# Patient Record
Sex: Male | Born: 1935 | Race: White | Hispanic: No | Marital: Married | Smoking: Former smoker
Health system: Southern US, Community
[De-identification: ages and names within clinical notes are randomized; demographics above are authoritative.]

## PROBLEM LIST (undated history)

## (undated) DIAGNOSIS — D689 Coagulation defect, unspecified: Secondary | ICD-10-CM

## (undated) DIAGNOSIS — J40 Bronchitis, not specified as acute or chronic: Secondary | ICD-10-CM

## (undated) DIAGNOSIS — N3020 Other chronic cystitis without hematuria: Secondary | ICD-10-CM

## (undated) DIAGNOSIS — C61 Malignant neoplasm of prostate: Secondary | ICD-10-CM

## (undated) DIAGNOSIS — C679 Malignant neoplasm of bladder, unspecified: Secondary | ICD-10-CM

## (undated) DIAGNOSIS — I2510 Atherosclerotic heart disease of native coronary artery without angina pectoris: Secondary | ICD-10-CM

## (undated) DIAGNOSIS — K604 Rectal fistula, unspecified: Secondary | ICD-10-CM

## (undated) DIAGNOSIS — N321 Vesicointestinal fistula: Secondary | ICD-10-CM

## (undated) DIAGNOSIS — R51 Headache: Secondary | ICD-10-CM

## (undated) DIAGNOSIS — M779 Enthesopathy, unspecified: Secondary | ICD-10-CM

## (undated) DIAGNOSIS — I10 Essential (primary) hypertension: Secondary | ICD-10-CM

## (undated) DIAGNOSIS — R069 Unspecified abnormalities of breathing: Secondary | ICD-10-CM

## (undated) DIAGNOSIS — I219 Acute myocardial infarction, unspecified: Secondary | ICD-10-CM

## (undated) DIAGNOSIS — I719 Aortic aneurysm of unspecified site, without rupture: Secondary | ICD-10-CM

## (undated) DIAGNOSIS — Z8679 Personal history of other diseases of the circulatory system: Secondary | ICD-10-CM

## (undated) DIAGNOSIS — I447 Left bundle-branch block, unspecified: Secondary | ICD-10-CM

## (undated) DIAGNOSIS — Z510 Encounter for antineoplastic radiation therapy: Secondary | ICD-10-CM

## (undated) DIAGNOSIS — N182 Chronic kidney disease, stage 2 (mild): Secondary | ICD-10-CM

## (undated) DIAGNOSIS — Z87891 Personal history of nicotine dependence: Secondary | ICD-10-CM

## (undated) DIAGNOSIS — R310 Gross hematuria: Secondary | ICD-10-CM

## (undated) DIAGNOSIS — K219 Gastro-esophageal reflux disease without esophagitis: Secondary | ICD-10-CM

## (undated) DIAGNOSIS — F329 Major depressive disorder, single episode, unspecified: Secondary | ICD-10-CM

## (undated) DIAGNOSIS — F3289 Other specified depressive episodes: Secondary | ICD-10-CM

## (undated) DIAGNOSIS — E7800 Pure hypercholesterolemia, unspecified: Secondary | ICD-10-CM

## (undated) HISTORY — DX: Personal history of other diseases of the circulatory system: Z86.79

## (undated) HISTORY — PX: CORONARY ARTERY BYPASS GRAFT: SHX141

## (undated) HISTORY — DX: Encounter for antineoplastic radiation therapy: Z51.0

## (undated) HISTORY — DX: Personal history of nicotine dependence: Z87.891

## (undated) HISTORY — DX: Aortic aneurysm of unspecified site, without rupture: I71.9

## (undated) HISTORY — DX: Essential (primary) hypertension: I10

## (undated) HISTORY — DX: Acute myocardial infarction, unspecified: I21.9

## (undated) HISTORY — DX: Vesicointestinal fistula: N32.1

## (undated) HISTORY — PX: CATARACT EXTRACTION: SUR2

## (undated) HISTORY — DX: Rectal fistula: K60.4

## (undated) HISTORY — DX: Other chronic cystitis without hematuria: N30.20

## (undated) HISTORY — DX: Major depressive disorder, single episode, unspecified: F32.9

## (undated) HISTORY — DX: Rectal fistula, unspecified: K60.40

## (undated) HISTORY — DX: Other specified depressive episodes: F32.89

## (undated) HISTORY — PX: CARDIAC CATHETERIZATION: SHX172

## (undated) HISTORY — DX: Gross hematuria: R31.0

## (undated) HISTORY — DX: Pure hypercholesterolemia, unspecified: E78.00

## (undated) HISTORY — DX: Coagulation defect, unspecified: D68.9

## (undated) HISTORY — DX: Malignant neoplasm of bladder, unspecified: C67.9

## (undated) HISTORY — DX: Malignant neoplasm of prostate: C61

---

## 1964-07-02 HISTORY — PX: COLON SURGERY: SHX602

## 1966-07-02 HISTORY — PX: COLON SURGERY: SHX602

## 1990-07-02 HISTORY — PX: BALLOON DILATION: SHX5330

## 1991-07-03 HISTORY — PX: ANGIOPLASTY: SHX39

## 1996-07-02 HISTORY — PX: BYPASS GRAFT: SHX909

## 1996-07-02 HISTORY — PX: PROSTATE SURGERY: SHX751

## 1997-04-01 HISTORY — PX: OTHER SURGICAL HISTORY: SHX169

## 1997-05-02 HISTORY — PX: OTHER SURGICAL HISTORY: SHX169

## 1997-07-02 HISTORY — PX: OTHER SURGICAL HISTORY: SHX169

## 1998-05-02 HISTORY — PX: OTHER SURGICAL HISTORY: SHX169

## 1998-05-05 ENCOUNTER — Ambulatory Visit (HOSPITAL_COMMUNITY): Admission: RE | Admit: 1998-05-05 | Discharge: 1998-05-05 | Payer: Self-pay | Admitting: Gastroenterology

## 2002-06-12 ENCOUNTER — Emergency Department (HOSPITAL_COMMUNITY): Admission: EM | Admit: 2002-06-12 | Discharge: 2002-06-12 | Payer: Self-pay | Admitting: Emergency Medicine

## 2002-06-12 ENCOUNTER — Encounter: Payer: Self-pay | Admitting: Emergency Medicine

## 2002-06-12 ENCOUNTER — Encounter: Payer: Self-pay | Admitting: Orthopedic Surgery

## 2006-07-02 DIAGNOSIS — I719 Aortic aneurysm of unspecified site, without rupture: Secondary | ICD-10-CM

## 2006-07-02 HISTORY — DX: Aortic aneurysm of unspecified site, without rupture: I71.9

## 2006-08-09 ENCOUNTER — Encounter: Admission: RE | Admit: 2006-08-09 | Discharge: 2006-08-09 | Payer: Self-pay | Admitting: Internal Medicine

## 2006-08-31 HISTORY — PX: OTHER SURGICAL HISTORY: SHX169

## 2006-09-04 ENCOUNTER — Ambulatory Visit (HOSPITAL_COMMUNITY): Admission: RE | Admit: 2006-09-04 | Discharge: 2006-09-05 | Payer: Self-pay | Admitting: Urology

## 2006-09-04 ENCOUNTER — Encounter (INDEPENDENT_AMBULATORY_CARE_PROVIDER_SITE_OTHER): Payer: Self-pay | Admitting: Specialist

## 2006-09-18 ENCOUNTER — Ambulatory Visit: Payer: Self-pay | Admitting: Vascular Surgery

## 2006-10-21 ENCOUNTER — Encounter (INDEPENDENT_AMBULATORY_CARE_PROVIDER_SITE_OTHER): Payer: Self-pay | Admitting: Specialist

## 2006-10-21 ENCOUNTER — Ambulatory Visit (HOSPITAL_BASED_OUTPATIENT_CLINIC_OR_DEPARTMENT_OTHER): Admission: RE | Admit: 2006-10-21 | Discharge: 2006-10-21 | Payer: Self-pay | Admitting: Urology

## 2007-03-10 ENCOUNTER — Ambulatory Visit (HOSPITAL_BASED_OUTPATIENT_CLINIC_OR_DEPARTMENT_OTHER): Admission: RE | Admit: 2007-03-10 | Discharge: 2007-03-10 | Payer: Self-pay | Admitting: Urology

## 2007-03-10 ENCOUNTER — Encounter (INDEPENDENT_AMBULATORY_CARE_PROVIDER_SITE_OTHER): Payer: Self-pay | Admitting: Urology

## 2007-03-18 ENCOUNTER — Encounter: Admission: RE | Admit: 2007-03-18 | Discharge: 2007-03-18 | Payer: Self-pay | Admitting: Vascular Surgery

## 2007-03-18 ENCOUNTER — Ambulatory Visit: Payer: Self-pay | Admitting: Vascular Surgery

## 2007-09-16 ENCOUNTER — Ambulatory Visit: Payer: Self-pay | Admitting: Vascular Surgery

## 2007-09-16 ENCOUNTER — Encounter: Admission: RE | Admit: 2007-09-16 | Discharge: 2007-09-16 | Payer: Self-pay | Admitting: Vascular Surgery

## 2008-03-16 ENCOUNTER — Ambulatory Visit: Payer: Self-pay | Admitting: Vascular Surgery

## 2008-03-22 ENCOUNTER — Ambulatory Visit (HOSPITAL_COMMUNITY): Admission: RE | Admit: 2008-03-22 | Discharge: 2008-03-22 | Payer: Self-pay | Admitting: Urology

## 2008-09-07 ENCOUNTER — Ambulatory Visit: Payer: Self-pay | Admitting: Vascular Surgery

## 2008-09-12 ENCOUNTER — Ambulatory Visit: Payer: Self-pay | Admitting: *Deleted

## 2008-09-13 ENCOUNTER — Observation Stay (HOSPITAL_COMMUNITY): Admission: EM | Admit: 2008-09-13 | Discharge: 2008-09-14 | Payer: Self-pay | Admitting: Emergency Medicine

## 2009-07-02 HISTORY — PX: OTHER SURGICAL HISTORY: SHX169

## 2009-08-17 ENCOUNTER — Ambulatory Visit (HOSPITAL_COMMUNITY): Admission: RE | Admit: 2009-08-17 | Discharge: 2009-08-17 | Payer: Self-pay | Admitting: Urology

## 2010-02-22 ENCOUNTER — Ambulatory Visit (HOSPITAL_COMMUNITY): Admission: RE | Admit: 2010-02-22 | Discharge: 2010-02-22 | Payer: Self-pay | Admitting: Urology

## 2010-04-21 ENCOUNTER — Ambulatory Visit (HOSPITAL_COMMUNITY)
Admission: RE | Admit: 2010-04-21 | Discharge: 2010-04-21 | Payer: Self-pay | Source: Home / Self Care | Admitting: Urology

## 2010-06-19 ENCOUNTER — Encounter (HOSPITAL_BASED_OUTPATIENT_CLINIC_OR_DEPARTMENT_OTHER)
Admission: RE | Admit: 2010-06-19 | Discharge: 2010-08-01 | Payer: Self-pay | Source: Home / Self Care | Attending: Internal Medicine | Admitting: Internal Medicine

## 2010-06-29 ENCOUNTER — Ambulatory Visit (HOSPITAL_COMMUNITY): Admission: RE | Admit: 2010-06-29 | Payer: Self-pay | Source: Home / Self Care | Admitting: Internal Medicine

## 2010-07-11 ENCOUNTER — Encounter (HOSPITAL_BASED_OUTPATIENT_CLINIC_OR_DEPARTMENT_OTHER): Payer: Self-pay | Admitting: Internal Medicine

## 2010-07-11 ENCOUNTER — Ambulatory Visit (HOSPITAL_COMMUNITY)
Admission: RE | Admit: 2010-07-11 | Discharge: 2010-07-11 | Payer: Self-pay | Source: Home / Self Care | Attending: Internal Medicine | Admitting: Internal Medicine

## 2010-07-17 LAB — DIFFERENTIAL

## 2010-07-17 LAB — COMPREHENSIVE METABOLIC PANEL
ALT: 10 U/L (ref 0–53)
AST: 20 U/L (ref 0–37)
Albumin: 3.2 g/dL — ABNORMAL LOW (ref 3.5–5.2)
Alkaline Phosphatase: 56 U/L (ref 39–117)
BUN: 9 mg/dL (ref 6–23)
CO2: 30 mEq/L (ref 19–32)
Calcium: 9.5 mg/dL (ref 8.4–10.5)
Chloride: 103 mEq/L (ref 96–112)
Creatinine, Ser: 0.92 mg/dL (ref 0.4–1.5)
GFR calc Af Amer: >60 mL/min (ref >60)
GFR calc non Af Amer: >60 mL/min (ref >60)
Glucose, Bld: 114 mg/dL — ABNORMAL HIGH (ref 70–99)
Potassium: 4.3 mEq/L (ref 3.5–5.1)
Sodium: 140 mEq/L (ref 135–145)
Total Bilirubin: 0.5 mg/dL (ref 0.3–1.2)
Total Protein: 7.6 g/dL (ref 6.0–8.3)

## 2010-07-17 LAB — CBC
HCT: 42 % (ref 39.0–52.0)
Hemoglobin: 13.9 g/dL (ref 13.0–17.0)
MCH: 30.6 pg (ref 26.0–34.0)
MCHC: 33.1 g/dL (ref 30.0–36.0)
MCV: 92.5 fL (ref 78.0–100.0)
Platelets: 203 10*3/uL (ref 150–400)
RBC: 4.54 MIL/uL (ref 4.22–5.81)
RDW: 15.1 % (ref 11.5–15.5)
WBC: 9.4 10*3/uL (ref 4.0–10.5)

## 2010-07-17 LAB — SEDIMENTATION RATE: Sed Rate: 37 mm/hr — ABNORMAL HIGH (ref 0–16)

## 2010-07-17 LAB — PREALBUMIN: Prealbumin: 16.3 mg/dL — ABNORMAL LOW (ref 17.0–34.0)

## 2010-07-23 ENCOUNTER — Encounter: Payer: Self-pay | Admitting: Urology

## 2010-07-23 ENCOUNTER — Encounter: Payer: Self-pay | Admitting: Vascular Surgery

## 2010-07-24 ENCOUNTER — Encounter: Payer: Self-pay | Admitting: Vascular Surgery

## 2010-08-02 ENCOUNTER — Encounter (HOSPITAL_BASED_OUTPATIENT_CLINIC_OR_DEPARTMENT_OTHER): Payer: Medicare Other | Attending: Internal Medicine

## 2010-08-02 DIAGNOSIS — M8708 Idiopathic aseptic necrosis of bone, other site: Secondary | ICD-10-CM | POA: Insufficient documentation

## 2010-08-02 DIAGNOSIS — I10 Essential (primary) hypertension: Secondary | ICD-10-CM | POA: Insufficient documentation

## 2010-08-02 DIAGNOSIS — Z923 Personal history of irradiation: Secondary | ICD-10-CM | POA: Insufficient documentation

## 2010-08-02 DIAGNOSIS — I2510 Atherosclerotic heart disease of native coronary artery without angina pectoris: Secondary | ICD-10-CM | POA: Insufficient documentation

## 2010-08-02 DIAGNOSIS — C61 Malignant neoplasm of prostate: Secondary | ICD-10-CM | POA: Insufficient documentation

## 2010-08-02 DIAGNOSIS — E785 Hyperlipidemia, unspecified: Secondary | ICD-10-CM | POA: Insufficient documentation

## 2010-08-31 ENCOUNTER — Encounter (HOSPITAL_BASED_OUTPATIENT_CLINIC_OR_DEPARTMENT_OTHER): Payer: Medicare Other | Attending: Internal Medicine

## 2010-08-31 DIAGNOSIS — I2510 Atherosclerotic heart disease of native coronary artery without angina pectoris: Secondary | ICD-10-CM | POA: Insufficient documentation

## 2010-08-31 DIAGNOSIS — C61 Malignant neoplasm of prostate: Secondary | ICD-10-CM | POA: Insufficient documentation

## 2010-08-31 DIAGNOSIS — Z923 Personal history of irradiation: Secondary | ICD-10-CM | POA: Insufficient documentation

## 2010-08-31 DIAGNOSIS — M8708 Idiopathic aseptic necrosis of bone, other site: Secondary | ICD-10-CM | POA: Insufficient documentation

## 2010-08-31 DIAGNOSIS — E785 Hyperlipidemia, unspecified: Secondary | ICD-10-CM | POA: Insufficient documentation

## 2010-08-31 DIAGNOSIS — I10 Essential (primary) hypertension: Secondary | ICD-10-CM | POA: Insufficient documentation

## 2010-09-15 LAB — BASIC METABOLIC PANEL
BUN: 9 mg/dL (ref 6–23)
CO2: 29 mEq/L (ref 19–32)
Calcium: 9.1 mg/dL (ref 8.4–10.5)
Chloride: 102 mEq/L (ref 96–112)
Creatinine, Ser: 0.86 mg/dL (ref 0.4–1.5)
GFR calc Af Amer: >60 mL/min (ref >60)
GFR calc non Af Amer: >60 mL/min (ref >60)
Glucose, Bld: 123 mg/dL — ABNORMAL HIGH (ref 70–99)
Potassium: 4.2 mEq/L (ref 3.5–5.1)
Sodium: 138 mEq/L (ref 135–145)

## 2010-09-15 LAB — SURGICAL PCR SCREEN
MRSA, PCR: NEGATIVE
Staphylococcus aureus: POSITIVE — AB

## 2010-09-15 LAB — CBC
HCT: 37.1 % — ABNORMAL LOW (ref 39.0–52.0)
Hemoglobin: 12.2 g/dL — ABNORMAL LOW (ref 13.0–17.0)
MCH: 29.6 pg (ref 26.0–34.0)
MCHC: 33 g/dL (ref 30.0–36.0)
MCV: 89.6 fL (ref 78.0–100.0)
Platelets: 287 10*3/uL (ref 150–400)
RBC: 4.14 MIL/uL — ABNORMAL LOW (ref 4.22–5.81)
RDW: 16.1 % — ABNORMAL HIGH (ref 11.5–15.5)
WBC: 9.2 10*3/uL (ref 4.0–10.5)

## 2010-09-20 LAB — BASIC METABOLIC PANEL
BUN: 13 mg/dL (ref 6–23)
CO2: 26 mEq/L (ref 19–32)
Calcium: 9.4 mg/dL (ref 8.4–10.5)
Chloride: 103 mEq/L (ref 96–112)
Creatinine, Ser: 1.04 mg/dL (ref 0.4–1.5)
GFR calc Af Amer: >60 mL/min (ref >60)
GFR calc non Af Amer: >60 mL/min (ref >60)
Glucose, Bld: 133 mg/dL — ABNORMAL HIGH (ref 70–99)
Potassium: 4.5 mEq/L (ref 3.5–5.1)
Sodium: 138 mEq/L (ref 135–145)

## 2010-09-20 LAB — CBC
HCT: 42.1 % (ref 39.0–52.0)
MCHC: 33.9 g/dL (ref 30.0–36.0)
MCV: 92.4 fL (ref 78.0–100.0)
RBC: 4.56 MIL/uL (ref 4.22–5.81)
WBC: 11 10*3/uL — ABNORMAL HIGH (ref 4.0–10.5)

## 2010-10-02 ENCOUNTER — Encounter (HOSPITAL_BASED_OUTPATIENT_CLINIC_OR_DEPARTMENT_OTHER): Payer: Medicare Other | Attending: Internal Medicine

## 2010-10-02 DIAGNOSIS — M8708 Idiopathic aseptic necrosis of bone, other site: Secondary | ICD-10-CM | POA: Insufficient documentation

## 2010-10-02 DIAGNOSIS — I10 Essential (primary) hypertension: Secondary | ICD-10-CM | POA: Insufficient documentation

## 2010-10-02 DIAGNOSIS — I2510 Atherosclerotic heart disease of native coronary artery without angina pectoris: Secondary | ICD-10-CM | POA: Insufficient documentation

## 2010-10-02 DIAGNOSIS — E785 Hyperlipidemia, unspecified: Secondary | ICD-10-CM | POA: Insufficient documentation

## 2010-10-02 DIAGNOSIS — Z923 Personal history of irradiation: Secondary | ICD-10-CM | POA: Insufficient documentation

## 2010-10-02 DIAGNOSIS — C61 Malignant neoplasm of prostate: Secondary | ICD-10-CM | POA: Insufficient documentation

## 2010-10-12 LAB — CBC
HCT: 37.6 % — ABNORMAL LOW (ref 39.0–52.0)
HCT: 40.8 % (ref 39.0–52.0)
HCT: 41.7 % (ref 39.0–52.0)
Hemoglobin: 13.4 g/dL (ref 13.0–17.0)
Hemoglobin: 14.2 g/dL (ref 13.0–17.0)
Hemoglobin: 14.4 g/dL (ref 13.0–17.0)
MCHC: 35.5 g/dL (ref 30.0–36.0)
MCHC: 34.5 g/dL (ref 30.0–36.0)
MCHC: 34.8 g/dL (ref 30.0–36.0)
MCV: 95.5 fL (ref 78.0–100.0)
MCV: 97 fL (ref 78.0–100.0)
MCV: 97.1 fL (ref 78.0–100.0)
Platelets: 141 10*3/uL — ABNORMAL LOW (ref 150–400)
Platelets: 153 10*3/uL (ref 150–400)
Platelets: 175 10*3/uL (ref 150–400)
RBC: 3.94 MIL/uL — ABNORMAL LOW (ref 4.22–5.81)
RBC: 4.2 MIL/uL — ABNORMAL LOW (ref 4.22–5.81)
RBC: 4.3 MIL/uL (ref 4.22–5.81)
RDW: 13.7 % (ref 11.5–15.5)
RDW: 13.5 % (ref 11.5–15.5)
RDW: 13.5 % (ref 11.5–15.5)
WBC: 7.8 10*3/uL (ref 4.0–10.5)
WBC: 8.3 10*3/uL (ref 4.0–10.5)
WBC: 8.4 10*3/uL (ref 4.0–10.5)

## 2010-10-12 LAB — POCT I-STAT, CHEM 8
BUN: 12 mg/dL (ref 6–23)
Calcium, Ion: 1.16 mmol/L (ref 1.12–1.32)
Chloride: 105 meq/L (ref 96–112)
Creatinine, Ser: 1 mg/dL (ref 0.4–1.5)
Glucose, Bld: 88 mg/dL (ref 70–99)
HCT: 43 % (ref 39.0–52.0)
Hemoglobin: 14.6 g/dL (ref 13.0–17.0)
Potassium: 4.2 meq/L (ref 3.5–5.1)
Sodium: 142 meq/L (ref 135–145)
TCO2: 28 mmol/L (ref 0–100)

## 2010-10-12 LAB — POCT CARDIAC MARKERS
Myoglobin, poc: 96.7 ng/mL (ref 12–200)
Troponin i, poc: <0.05 ng/mL (ref 0.00–0.09)

## 2010-10-12 LAB — CARDIAC PANEL(CRET KIN+CKTOT+MB+TROPI)
CK, MB: 1.5 ng/mL (ref 0.3–4.0)
CK, MB: 1.8 ng/mL (ref 0.3–4.0)
Relative Index: INVALID (ref 0.0–2.5)
Relative Index: INVALID (ref 0.0–2.5)
Total CK: 62 U/L (ref 7–232)
Total CK: 75 U/L (ref 7–232)

## 2010-10-12 LAB — DIFFERENTIAL
Basophils Absolute: 0 10*3/uL (ref 0.0–0.1)
Basophils Relative: 1 % (ref 0–1)
Lymphocytes Relative: 18 % (ref 12–46)
Monocytes Absolute: 0.6 10*3/uL (ref 0.1–1.0)
Neutro Abs: 6 10*3/uL (ref 1.7–7.7)
Neutrophils Relative %: 71 % (ref 43–77)

## 2010-10-12 LAB — PROTIME-INR
INR: 1 (ref 0.00–1.49)
Prothrombin Time: 13.8 s (ref 11.6–15.2)

## 2010-10-12 LAB — LIPID PANEL
HDL: 35 mg/dL — ABNORMAL LOW
Total CHOL/HDL Ratio: 3.9 ratio
Triglycerides: 70 mg/dL
VLDL: 14 mg/dL (ref 0–40)

## 2010-10-12 LAB — BASIC METABOLIC PANEL WITH GFR
BUN: 12 mg/dL (ref 6–23)
CO2: 25 meq/L (ref 19–32)
Calcium: 9 mg/dL (ref 8.4–10.5)
Chloride: 104 meq/L (ref 96–112)
Creatinine, Ser: 1.05 mg/dL (ref 0.4–1.5)
GFR calc non Af Amer: >60 mL/min
Glucose, Bld: 115 mg/dL — ABNORMAL HIGH (ref 70–99)
Potassium: 4.2 meq/L (ref 3.5–5.1)
Sodium: 139 meq/L (ref 135–145)

## 2010-10-12 LAB — HEPARIN LEVEL (UNFRACTIONATED): Heparin Unfractionated: <0.1 IU/mL — ABNORMAL LOW (ref 0.30–0.70)

## 2010-10-31 ENCOUNTER — Encounter (HOSPITAL_BASED_OUTPATIENT_CLINIC_OR_DEPARTMENT_OTHER): Payer: Medicare Other | Attending: Internal Medicine

## 2010-10-31 DIAGNOSIS — M8708 Idiopathic aseptic necrosis of bone, other site: Secondary | ICD-10-CM | POA: Insufficient documentation

## 2010-10-31 DIAGNOSIS — Z8546 Personal history of malignant neoplasm of prostate: Secondary | ICD-10-CM | POA: Insufficient documentation

## 2010-10-31 DIAGNOSIS — C679 Malignant neoplasm of bladder, unspecified: Secondary | ICD-10-CM | POA: Insufficient documentation

## 2010-10-31 DIAGNOSIS — Y842 Radiological procedure and radiotherapy as the cause of abnormal reaction of the patient, or of later complication, without mention of misadventure at the time of the procedure: Secondary | ICD-10-CM | POA: Insufficient documentation

## 2010-10-31 DIAGNOSIS — C61 Malignant neoplasm of prostate: Secondary | ICD-10-CM | POA: Insufficient documentation

## 2010-11-14 NOTE — Assessment & Plan Note (Signed)
OFFICE VISIT   Zachary Haas, Zachary Haas  DOB:  [birth year]                                       09/16/2007  ZOXWR#:[id number]   HISTORY:  I saw the patient in the office today for continued followup  of his abdominal aortic aneurysm.  This is a pleasant 77 year old  gentleman who I originally had seen in consultation with an abdominal  aortic aneurysm in March of 2008 at which time it measured 4.6 cm in  maximum diameter.  On his most recent followup visit it was measuring at  5 cm only the aorta was slightly tortuous and this may have been an  overestimate.  He comes in for his 6 month followup visit.  Since I saw  him last he has had no significant abdominal or back pain.  He has  undergone a previous transurethral resection for a bladder cancer and  also has had radiation seeds for prostate cancer and he has had some  recent problems with scarring in the bladder and urethra.   REVIEW OF SYSTEMS:  On review of systems he has had no recent chest  pain, chest pressure, palpitations or arrhythmias.  He has had no  bronchitis, asthma or wheezing.  Vascular:  He has had no claudication,  rest pain or nonhealing ulcers.  He has had no previous history of DVT  or phlebitis.  Neurological:  He has had no history of strokes, TIAs,  expressive or receptive aphasia or amaurosis fugax.  GU:  He has had  some problems with incontinence.  GI:  He has had some diarrhea.  Neuro,  hematologic, ortho, dermatologic review of systems are all unremarkable.   PHYSICAL EXAMINATION:  General:  This is a pleasant 77 year old  gentleman who appears his stated age.  Vital signs:  Blood pressure is  126/71, heart rate is 83, saturation 89%.  HEENT:  I do not detect any  carotid bruits.  He has no cervical lymphadenopathy.  Lungs:  Lungs are  clear bilaterally to auscultation.  Cardiovascular:  He has a regular  rate and rhythm.  Abdomen:  Soft and nontender.  His aneurysm is  nontender.   He has palpable femoral pulses and warm well perfused feet  without any evidence of atheroembolic disease.  He has no significant  lower extremity swelling.  Neurological:  He has good strength in the  upper extremities and lower extremities bilaterally.   His CT scan was reviewed.  The maximum diameter was measured at 4.8 cm.  There has been no significant change in the size of the aneurysm  compared to 6 months ago.  It has a fairly long neck of approximately  2.5 cm.  The aneurysm appears to end above the bifurcation.  There is  significant calcific disease within the iliac arteries.   I have explained that we generally would not consider elective repair  unless the aneurysm enlarged to 5.5 cm.  I have recommended that we see  him back in 6 months with a followup duplex scan.  He knows to call  sooner if he has problems.  Fortunately he is not a smoker.  His blood  pressure is also well controlled.   Di Kindle. Edilia Bo, M.D.  Electronically Signed   CSD/MEDQ  D:  09/16/2007  T:  09/17/2007  Job:  [id number]

## 2010-11-14 NOTE — Discharge Summary (Signed)
[name], Zachary Haas NO.:  [id number]   MEDICAL RECORD NO.:  [id number]          PATIENT TYPE:  INP   LOCATION:  4729                         FACILITY:  [facility]   PHYSICIAN:  Corky Crafts, MDDATE OF BIRTH:  [birth year]   DATE OF ADMISSION:  09/12/2008  DATE OF DISCHARGE:  09/13/2008                               DISCHARGE SUMMARY   DISCHARGE DIAGNOSES:  1. Chest pain, resolved.  2. Known coronary artery disease.  3. History of bypass surgery in the remote past.  4. Abdominal aortic aneurysm, 4.4 cm, last CT in March 2010.  5. Transitional cell carcinoma of the bladder.  6. Status post chemotherapy.  7. Adenocarcinoma of the prostate, status post radiation.  8. Urethral stricture.  9. Hypertension.  10.Hyperlipidemia.  11.Anxiety.  12.Depression.  13.Long-term medication use.   [name] is a 77 year old male patient with known coronary artery  disease.  He has a history of coronary artery bypass grafting.  He had  an episode of substernal chest pain on September 12, 2008, around 10 p.m.  The chest pain radiated over the entire chest, and it was associated  with shortness of breath and diaphoresis.  Pain relieved with 2  sublingual nitroglycerin in the emergency room.  The patient had,  1. Point-of-care markers that were negative.  2. Three sets of cardiac enzymes that were negative.   Otherwise, study shows hemoglobin of 14.2, hematocrit 40.8, platelets  153, white count 8.3.  Sodium 179, potassium 4.2, BUN 12, creatinine  1.05.  He felt good.  He had no further chest pain.  We felt that with  the enzymes being negative that we can let him go home and have an  outpatient stress test.  We did add Imdur to his medical regimen and  scheduled him for an outpatient stress test.  His adenosine Cardiolite  is scheduled for tomorrow, September 14, 2008, at 8 a.m.  He is not to have  any caffeine or not to eat anything after midnight.   DISCHARGE MEDICATIONS:  1.  Simvastatin 40 mg a day.  2. Lisinopril 10 mg a day.  3. Metoprolol-XL 6.25 mg a day.  4. Wellbutrin 100 mg twice a day.  5. Celexa 20 mg a day.  6. Baby aspirin 81 mg a day.  7. Multivitamin daily.  8. Fiber capsules daily.  9. Stool softener daily.  10.Imdur 30 mg once a day, which is new.   The patient is to remain on low-sodium heart-healthy diet and increase  activity slowly.   FOLLOWUP:  Follow up with Dr. Eldridge Dace on Tuesday, September 14, 2008, at 8  a.m. for an adenosine Cardiolite.   DISCHARGE TIME:  Greater than 30 minutes.      Guy Franco, P.A.      Corky Crafts, MD  Electronically Signed    LB/MEDQ  D:  09/13/2008  T:  09/14/2008  Job:  [id number]

## 2010-11-14 NOTE — Procedures (Signed)
DUPLEX ULTRASOUND OF ABDOMINAL AORTA   INDICATION:  Follow up of abdominal aortic aneurysm.   HISTORY:  Diabetes:  No.  Cardiac:  MI.  Hypertension:  No.  Smoking:  Quit.  Connective Tissue Disorder:  Family History:  No.  Previous Surgery:  No.   DUPLEX EXAM:         AP (cm)                   TRANSVERSE (cm)  Proximal             2.19 Cm                   2.01 cm  Mid                  3.99 cm                   3.93 cm  Distal               4.46 cm                   4.78 cm  Right Iliac          1.13 cm                   1.25 cm  Left Iliac           1.05 cm                   1.18 cm   PREVIOUS CT:  Date:  09/16/07  AP:  4.8   IMPRESSION:  Stable abdominal aortic aneurysm with largest measurement  of 4.46 cm X 4.78 cm.   ___________________________________________  Zachary Haas. Edilia Bo, M.D.   AS/MEDQ  D:  03/16/2008  T:  03/16/2008  Job:  [id number]

## 2010-11-14 NOTE — H&P (Signed)
Zachary, Haas NO.:  [id number]   MEDICAL RECORD NO.:  [id number]          PATIENT TYPE:  INP   LOCATION:  4729                         FACILITY:  [facility]   PHYSICIAN:  Unice Cobble, MD     DATE OF BIRTH:  Apr 07, 1928   DATE OF ADMISSION:  09/12/2008  DATE OF DISCHARGE:                              HISTORY & PHYSICAL   CARDIOLOGIST:  Dr. Verdis Prime with [facility].   CHIEF COMPLAINT:  Chest pain.   HISTORY OF PRESENT ILLNESS:  This is a 77 year old white male with a  history of bypass surgery in 1998 who had onset of chest pain tonight at  2200 at rest.  The patient was watching TV when he had acute onset of  substernal chest pain radiating over his left chest area associated with  shortness of breath and diaphoresis.  No nausea.  No edema, orthopnea or  PND.  He tried taking some aspirin, and this did not relieve the pain.  He had old nitroglycerin which also did not relieve the pain.  EMS  picked him up and took him to the hospital where two sublingual  nitroglycerins relieved his pain in the emergency department.  Prior to  this, he has not taken any sublingual nitroglycerin except for one-time  since his bypass surgery.   PAST MEDICAL HISTORY:  1. Bypass surgery, four-vessel in 1998.  2. AAA 4.4 cm last measured by CT on March 10.  3. TCC of the bladder with recurrence since September 2009 status post      fulguration and mitomycin.  4. Adenocarcinoma of the prostate status post radiation with resultant      radiation burn to his lower colon.  5. Urethral stricture status post dilatation.  6. Hypertension.  7. Hyperlipidemia.  8. Anxiety/depression.   ALLERGIES:  OXYCODONE CAUSES THROAT SWELLING.   MEDICATIONS:  1. Zocor 40 mg daily.  2. Lisinopril 10 mg daily.  3. Metoprolol XL 6.25 mg daily.  4. Wellbutrin 100 mg b.i.d.  5. Celexa 20 mg daily.  6. Aspirin 81 mg daily.  7. Multivitamin daily.  8. Fiber capsules 4-5 per day.  9.  Stool softener three tablets daily.   SOCIAL HISTORY:  Lives in [place] with his wife and son.  He is a  retired Insurance claims handler.  He quit tobacco in 1998 but smoked for 50  years prior to that.  He drinks two beers on Friday when he goes  bowling.  No drugs.   FAMILY HISTORY:  Noncontributory.   REVIEW OF SYSTEMS:  Other than what has been mentioned in the HPI,  complete review of systems has been done and otherwise felt to be  negative.   PHYSICAL EXAMINATION:  VITAL SIGNS:  He is afebrile with a pulse of 71,  respiratory rate 16, blood pressure 123/66, O2 sats 96 on 2 liters.  GENERAL:  He is no acute distress.  Overweight.  HEENT:  Shows EOMI, MMM, false dentition, oropharynx without erythema or  exudates.  NECK:  Supple without lymphadenopathy, thyromegaly, bruits, jugular  venous distention.  HEART:  Has a regular rate and rhythm with normal S1-S2.  No murmurs,  gallops, rubs.  Normal PMI.  LUNGS:  Clear to auscultation bilaterally without wheezes, rhonchi or  rales.  SKIN:  No rashes or lesions.  ABDOMEN:  Soft and nontender with normal bowel sounds.  No rebound or  guarding.  EXTREMITIES:  No cyanosis, clubbing or edema.  MUSCULOSKELETAL:  Shows no joint deformity, effusions or spine or CVA  tenderness.  NEUROLOGICAL:  He is alert and oriented x3 with strength 5/5 in all  extremities and axial groups.  His right pupil is bigger than his left  pupil, but otherwise his cranial nerves are grossly intact.   RADIOLOGY AND LABORATORY REVIEW:  Chest x-ray shows vascular congestion  and mild interstitial edema.  His EKG shows normal sinus rhythm at a  rate of 69.  There is a right bundle branch block and a left anterior  fascicular block which are both old.  He has no ST or T-wave changes.  Labs show a white count of 8.4 with a hemoglobin of 14.4 and a platelet  count 175.  His creatinine is 1.0.  CK-MB and troponin are negative at  this time.   ASSESSMENT/PLAN:  This  is a 77 year old white male with a history of  CABG in 1998 who presents with chest pain concerning for unstable  angina.  I will rule him out overnight with serial enzymes and ECGs.  His asymmetric pupils have not ever been noted before per the patient  and per his family.  A noncontrast head CT to rule out stroke and  intracranial bleed will be done prior to anticoagulation for his  unstable angina.  Otherwise, his home medications will be continued  including beta blocker, statin and aspirin.  The patient, on further  questioning, tells me that he has some a streaking of his bowel  movements with blood.  This is due to his radiation proctitis.  This has  been going on for years and does not seem to be a big issue for him.  He  will likely receive catheterization in the morning for his symptoms, and  he will be made n.p.o.      Unice Cobble, MD  Electronically Signed     ACJ/MEDQ  D:  09/13/2008  T:  09/13/2008  Job:  [id number]

## 2010-11-14 NOTE — Procedures (Signed)
DUPLEX ULTRASOUND OF ABDOMINAL AORTA   INDICATION:  Follow-up evaluation of known abdominal aortic aneurysm.   HISTORY:  Diabetes:  No.  Cardiac:  MI.  Hypertension:  No.  Smoking:  Former smoker.  Connective Tissue Disorder:  Family History:  Previous Surgery:   DUPLEX EXAM:         AP (cm)                   TRANSVERSE (cm)  Proximal             2.5 cm                    2.7 cm  Mid                  4.3 cm                    4.4 cm  Distal               3.1 cm                    3.2 cm  Right Iliac          1.0 cm                    1.1 cm  Left Iliac           0.98 cm                   1.0 cm   PREVIOUS:  Date: 03/16/08  AP:  3.99  TRANSVERSE:  3.93   IMPRESSION:  Abdominal aortic aneurysm is slightly larger than  previously recorded.   ___________________________________________  Di Kindle. Edilia Bo, M.D.   MC/MEDQ  D:  09/07/2008  T:  09/07/2008  Job:  [id number]

## 2010-11-14 NOTE — Assessment & Plan Note (Signed)
OFFICE VISIT   INDIO, SANTILLI  DOB:  Apr 03, 1928                                       03/18/2007  [id number]#:[id number]   I saw Mr. Lafoe in the office today for continued follow up of his  abdominal aortic aneurysm.  When I last saw him in March of this year  the maximum diameter was 4.6 cm.  We recommended a follow up CT in 6  months.  Since I saw him then he has had no abdominal or back pain.  He  has undergone transurethral resection of bladder cancer by Dr. Isabel Caprice  and I believe he is scheduled for immunotherapy.  He has otherwise been  doing well and there has been no significant change in his medical  history.   REVIEW OF SYSTEMS:  He has had no recent chest pain, chest pressure,  palpitations, or arrhythmias.  He has had no bronchitis, asthma, or  wheezing.   PHYSICAL EXAMINATION:  Vital signs:  Blood pressure is 131/71, heart  rate is 82, I do not detect any carotid bruits.  Lungs:  Clear  bilaterally to auscultation.  Cardiac:  He has a regular rate and  rhythm.  Abdomen:  Soft and nontender.  His aneurysm is palpable and  nontender.  He has a palpable femoral pulses with warm, well perfused  feet and no evidence of atheroembolic disease.   I did review his CT scan.  The radiologist has interpreted it as a  maximum diameter of 5 cm however at the area where they measured this  the aorta is slightly tortuous and by my measurement the aneurysm has  not changed in size.  I think the projection they were measuring was an  oblique cut.   I again explained we would not generally consider elective repair unless  the aneurysm were 5.5 cm in maximum diameter.  I have recommended a  follow up CT scan in 6 months and I will see him back at that time.  If  the aneurysm does enlarge to greater than 5.5 cm it looks like he might  potentially be a candidate for an endovascular repair.  Given his age  this might be a reasonable option.   Di Kindle.  Edilia Bo, M.D.  Electronically Signed   CSD/MEDQ  D:  03/18/2007  T:  03/19/2007  Job:  [id number]   cc:   Valetta Fuller, M.D.  Candyce Churn, M.D.

## 2010-11-14 NOTE — Op Note (Signed)
NAMEDAVIS, AMBROSINI NO.:  [id number]   MEDICAL RECORD NO.:  [id number]          PATIENT TYPE:  AMB   LOCATION:  DAY                          FACILITY:  [facility]   PHYSICIAN:  Valetta Fuller, M.D.  DATE OF BIRTH:  1927-10-26   DATE OF PROCEDURE:  03/22/2008  DATE OF DISCHARGE:                               OPERATIVE REPORT   PREOPERATIVE DIAGNOSES:  1. History transitional cell carcinoma of the bladder with evidence of      recurrence.  2. Remote history of adenocarcinoma of prostate, status post radiation      therapy.  3. Urethral stricture disease.   POSTOPERATIVE DIAGNOSES:  1. History transitional cell carcinoma of the bladder with evidence of      recurrence.  2. Remote history of adenocarcinoma of prostate, status post radiation      therapy.  3. Urethral stricture disease.   PROCEDURE PERFORMED:  Cystoscopy with balloon dilation of urethral  stricture, fulguration of multiple areas of papillary tumor recurrence  and installation of mitomycin.   SURGEON:  Valetta Fuller, M.D.   ANESTHESIA:  General.   INDICATIONS:  Mr. Noffke is 77 years of age.  He has had a longstanding  history of transitional cell carcinoma of the bladder.  In the past, the  patient did have a focal area of questionable T1 transitional cell  carcinoma bladder.  The patient has had induction course of BCG therapy  and has had several papillary recurrences.  He has not had definitive  invasive disease but again a questionable foci of T1 disease in the  past.  The patient also was treated with radiation therapy for  adenocarcinoma prostate in the distant past.  His PSAs have been  relatively stable at less than 1.0.  He has had some radiation cystitis  and chronically has had some voiding dysfunction.  He has had a tight  and clinically significant bulbar urethral stricture and has had  previous dilations.  Recent office reevaluation revealed a worsening of  his voiding  complaints.  The patient was noted to have a very tight  recurrent stricture which was treated with minimal dilation in the  office.  Cystoscopy recently showed what appeared to be a small  papillary recurrence at the dome of his bladder.  He presents now for  more definitive treatment of his stricture.  He is undergoing assessment  to determine whether urethral stenting might be indicated down the road.  We are also here to fulgurated the tumor and install some mitomycin.   TECHNIQUE AND FINDINGS:  The patient was brought to the operating room.  He received perioperative ciprofloxacin.  Spinal anesthetic was  performed and he was placed in lithotomy position, prepped and draped in  the usual manner.  Cystoscopy again revealed a bulbar urethral  stricture.  This appeared to start approximately 2-3 cm distal to the  urethral sphincter.  A guidewire was placed through this area.  A  fascial dilating balloon was utilized to dilate this to 24-French for 5  minutes.  At the completion of the procedure, the 22-French  cystoscope  went through this area without difficulty.  Again given the radiation  changes, one could see what appeared to be a dense stricture which was  now reasonably open status post the balloon dilation.  It appeared to  come relatively close to the external sphincter but now appeared to  actually involve that.  The prostatic urethra itself was relatively  nonobstructing.  His bladder neck was reasonably open.  On fluoroscopy  one could see the seed implantation still with a nice distribution from  previous.   Careful cystoscopy was performed with both the 30 degrees as well as 70  degrees lens systems.  A small 5-6 mm papillary appearing tumor was  noted at the dome of the bladder right at the bubble.  There was also a  questionable couple of fronds of papillary tumor in the area of a  previous resection site on the right lateral wall.  We initially made  attempts to try to  perform cold cup biopsy utilizing the rigid cold cup  instrument.  Due to severe fixation of his bladder neck from the  previous radiation, I was unable to safely identify the tumor and do the  biopsy, for that reason we elected just to fulgurate the papillary tumor  to decrease the risk of any bladder perforation and decrease our chances  of being able to instill mitomycin.  Utilizing the deflecting bridge, we  used a Bugbee electrode to identify the tumor which was completely  fulgurated.  We also fulgurated some of the small papillary fronds on  the right wall of the bladder.  No evidence of bladder perforation  occurred.  Careful reinspection revealed no evidence of any ongoing  active tumor.  Hemostasis was excellent.  We placed a 20-French Foley  catheter.  We then instilled mitomycin which will be left indwelling for  30-45 minutes.  The patient appeared to tolerate the procedure well.  There were no obvious complications or difficulties and he was brought  to recovery room in stable condition.      Valetta Fuller, M.D.  Electronically Signed     DSG/MEDQ  D:  03/22/2008  T:  03/23/2008  Job:  [id number]   cc:   Candyce Churn, M.D.  Fax: [phone]

## 2010-11-14 NOTE — Op Note (Signed)
NAMEANTONE, SUMMONS NO.:  [id number]   MEDICAL RECORD NO.:  [id number]          PATIENT TYPE:  AMB   LOCATION:  [facility]                         FACILITY:  [facility]   PHYSICIAN:  Valetta Fuller, M.D.  DATE OF BIRTH:  1927-08-30   DATE OF PROCEDURE:  03/10/2007  DATE OF DISCHARGE:                               OPERATIVE REPORT   PREOPERATIVE DIAGNOSIS:  Bulbar urethral stricture, transitional cell  carcinoma of the bladder.   POSTOPERATIVE DIAGNOSIS:  Bulbar urethral stricture, transitional cell  carcinoma of the bladder.   PROCEDURE PERFORMED:  Cystoscopy, balloon dilation of urethral  stricture, TURBT (small), fulguration.   SURGEON:  Dr. Barron Alvine.   SECOND ASSISTANT:  Dr. Melina Schools.   ANESTHESIA:  Spinal.   SPECIMENS:  Lateral wall biopsy.   INDICATIONS FOR PROCEDURE:  Mr. Asfaw is a 77 year old male with a  history of adenocarcinoma of the prostate status post brachytherapy.  He  developed hematuria and underwent transurethral resection of bladder  tumor which revealed mostly papillary disease but a focus of T1.  He has  multiple medical problems and subsequently missed his scheduled BCG  treatment.  He did return and was found to have a bulbar urethral  stricture which required dilation and a small papillary recurrence in  his bladder.  He presents for operative management of both.   DESCRIPTION OF PROCEDURE IN DETAIL:  The patient was brought to the  operating room, he was identified by his arm band. Informed consent was  verified and a preoperative timeout was performed. After successful  induction of spinal anesthesia, the patient was moved to the dorsal  lithotomy position. All appropriate pressure points were padded to avoid  apraxia and compartment syndrome.  Perioperative antibiotics were  administered and sequential compression devices were employed.  The  perineum was prepped and draped in the usual fashion.  A 22-French  cystoscopic  sheath was used to introduce the 12-degree cystoscopic lens  into the urethra. The fossa navicular and pendulous urethra were within  normal limits.  Upon entering the bulbar urethra we saw an approximately  14-French caliber stricture that was relatively short.  A sensor wire  was placed via the __________  scope and into the bladder.  Its position  was confirmed with fluoroscopy.  We then used a nephrostomy dilator to  balloon dilate the urethra to a 28-French.  Contrast was injected into  the balloon until we saw the waist of the stricture resolve.  We then  held pressure at 14 mmHg for a period of 3 minutes.  The balloon was  then removed and the cystoscope reinserted. We saw excellent balloon  dilation of the urethra. The cystoscope was then inserted into the  bladder and pan cystoureteroscopy was performed with 12 and 70 degree  lenses.  Bilateral ureteral orifices were noted to be in normal anatomic  position along the trigone.  Each was seen to efflux clear urine.  The  remainder of the bladder was inspected.  It was 1+ trabeculated.  There  was a small subcentimeter area on the right side wall  with papillary  growth consistent with transitional cell carcinoma.  The remainder of  the bladder was free of any mucosal lesions, erythema, foreign bodies,  diverticula, stones.  The rigid cold cup forceps were used and the tumor  was cold cupped and sent for permanent pathology.  A Bugbee electrode  was used to fulgurate.  The bladder was drained and minimally refilled.  There was no venous bleeding.  At this time, we removed the cystoscope  and placed a 20-French Foley catheter transurethrally in the bladder and  balloon deflated with 10 mL sterile water was placed to straight drain  and drainage was clear.  At this time, the procedure was terminated.  The patient tolerated the procedure well and there were no  complications.  Barron Alvine was the attending primary and responsible   physician and was present and participated in all aspects of the  procedure.   DISPOSITION:  The patient was transported safely to the post anesthesia  care unit.  He will be seen back in 2-3 days for a __________  catheter.  He is on a course of antibiotics and will be given a prescription for  Vicodin 5/500 #20.     ______________________________  Melina Schools, MD      Valetta Fuller, M.D.  Electronically Signed    JR/MEDQ  D:  03/10/2007  T:  03/11/2007  Job:  [id number]

## 2010-11-14 NOTE — Assessment & Plan Note (Signed)
OFFICE VISIT   Zachary Haas, Zachary Haas  DOB:  September 06, 1927                                       09/07/2008  MVHQI#:[id number]   I saw the patient in the office today for continued followup of his  abdominal aortic aneurysm.  I last saw him in March of 2009 when the  aneurysm measured 4.6 cm in maximum diameter.  I have explained to him  in the past that we generally would not consider elective repair of the  aneurysm in a normal risk patient unless it reached 5.5 cm in maximum  diameter.  Since I saw him last he has had no significant abdominal or  back pain.  He has been doing reasonably well.   REVIEW OF SYSTEMS:  He has had no recent chest pain, chest pressure,  palpitations or arrhythmias.  He has had no bronchitis, asthma or  wheezing.  He has had some issues with urethral stricture which Dr.  Isabel Caprice is following.  He has undergone previous prostate seed implants  for prostate cancer.   PHYSICAL EXAMINATION:  This is a pleasant 77 year old gentleman who  appears his stated age.  His blood pressure is 113/70, heart rate is 74.  Neck is supple.  There is no cervical lymphadenopathy.  I do not detect  any carotid bruits.  Lungs are clear bilaterally to auscultation.  On  cardiac exam he has a regular rate and rhythm.  Abdomen is soft and  nontender.  Because of his size it is difficult to palpate his aneurysm.  He does have palpable femoral pulses and warm well-perfused feet without  ischemic ulcers.  He has no significant lower extremity swelling.  He  does have significant spider veins and telangiectasias.  His neurologic  exam is nonfocal.   Ultrasound in our office today shows that the maximum diameter of his  aneurysm is 4.4 cm.  Thus there has been no significant change in the  size of his aneurysm.   I plan on seeing him back in 6 months with a followup CT scan.  I think  for now we will alternate ultrasound with a CT scan.  If the aneurysm  does  enlarge significantly and he requires repair based on his previous  CT it does look like he is a potential candidate for endovascular repair  of his aneurysm which given his age may be ideal.  However, again I have  explained we generally would not consider elective repair in a normal  risk patient unless the aneurysm reached 5.5 cm in maximum diameter.  Fortunately he is not a smoker.   We will see him back in 6 months.  He knows to call sooner if he has  problems.   Di Kindle. Edilia Bo, M.D.  Electronically Signed   CSD/MEDQ  D:  09/07/2008  T:  09/08/2008  Job:  [id number]   cc:   Candyce Churn, M.D.  Valetta Fuller, M.D.

## 2010-11-14 NOTE — Discharge Summary (Signed)
NAMEWHIT, Zachary NO.:  [id number]   MEDICAL RECORD NO.:  [id number]           PATIENT TYPE:   LOCATION:                                 FACILITY:   PHYSICIAN:  Corky Crafts, MDDATE OF BIRTH:  Jan 21, 1928   DATE OF ADMISSION:  09/13/2008  DATE OF DISCHARGE:  09/14/2008                               DISCHARGE SUMMARY   ADDENDUM:  Zachary Haas has smoked one-pack of cigarettes per day for 50  years.  His O2 saturations at rest are in the upper 90s.  When he  ambulates, they drop into the upper 80s.  He is not short of breath and  seemed to be at his baseline.  His O2 saturations did drop to 87% while  walking and this does qualify him for oxygen; however, he wishes not to  be placed on oxygen at this time.  He feels like he is back to his  baseline.  I have spoken at length with him about this and I have asked  him to talk with Dr. Johnella Moloney regarding his oxygenation.  If he has  any trouble breathing, he is to let Dr. Kevan Ny know immediately.  I  suspect COPD plays a large role in his hypoxemia with ambulation, but I  feel that he is at baseline at this point.  This was discussed with Dr.  Lance Muss.      Zachary Haas, P.A.      Corky Crafts, MD  Electronically Signed    LB/MEDQ  D:  09/14/2008  T:  09/15/2008  Job:  [id number]   cc:   Candyce Churn, M.D.

## 2010-11-17 NOTE — Op Note (Signed)
NAMEVIR, WHETSTINE NO.:  [id number]   MEDICAL RECORD NO.:  [id number]          PATIENT TYPE:  AMB   LOCATION:  [facility]                         FACILITY:  [facility]   PHYSICIAN:  Valetta Fuller, M.D.  DATE OF BIRTH:  [birth year]   DATE OF PROCEDURE:  10/21/2006  DATE OF DISCHARGE:                               OPERATIVE REPORT   PREOPERATIVE DIAGNOSES:  1. 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  2. Retained left double-J stent.   POSTOPERATIVE DIAGNOSES:  1. 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  2. Retained left double-J stent.  3. Bulbar urethral stricture disease.   PROCEDURE PERFORMED:  Cystoscopy, urethral dilation with Sissy Hoff  sounds, left double-J stent removal, multiple biopsies of left trigone  and left lateral wall bladder with fulguration and reinsertion of left  double-J stent.   SURGEON:  Valetta Fuller, M.D.   ANESTHESIA:  Spinal.   INDICATIONS:  Mr. Zoll is a 77 year old male.  He had originally  presented to see me with evidence of a bladder tumor.  He had had a CT  scan and as part of that, was found to have a soft tissue mass emanating  from the left posterior lateral wall of his bladder.  This tumor was  resected in early March 2008.  There was a mixture of low grade and high-  grade papillary urothelial carcinoma.  There were some foci suspicious  for superficial invasion, no evidence of obvious muscle invasive  disease.  We felt that the patient did have probable T1 tumor.  We felt  that it would be appropriate for him to have a second look.  He has had  a double-J stent in place because of tumor, did not necessarily  involved, became very close to the left ureteral orifice.  The patient  has had some voiding dysfunction but that seems to have settled down  with some Flomax.  He presents now for  reassessment.   TECHNIQUE AND FINDINGS:  The patient was brought to the operating room  where he had successful induction of spinal anesthetic.  He was placed  in the lithotomy position and prepped and draped in the usual manner.  Initial cystoscopy revealed unremarkable anterior urethra but in the  bulbar urethra there was some narrowing and what appeared to be some  inflammatory stricturing.  We could see beyond this area but were unable  to pass the scope.  For that reason, the cystoscope was removed and [employer] sounds were used to dilate him from about 16-26 [place] without  much difficulty, although there was some fixation in that area.  The  prostate again showed moderate to significant trilobar hyperplasia with  a median bar.  The bladder itself showed a stent with some mild  encrustations.  A hole left hemitrigone continued to show some areas  where the epithelium was denuded.  The mucosa around the orifice was  unremarkable.  There was no evidence of any obvious tumor.  There were  some definite areas of erythema and redness surrounding the previous  resection site.  Impossible to tell whether this was inflammatory or  carcinoma in situ.   With the stent still indwelling, we did multiple cold cut biopsies of  all the edges of this tumor resection site.  We sent the trigone bite  separate from the lateral wall.  We also took some deeper bites within  the base of this area.  This area was then extensively fulgurated.  I  went ahead and removed the stent.  The mucosa again appeared to be  unremarkable right around the orifice but I felt that it would be  prudent to go ahead and replace the double-J stent for another couple of  weeks just to be sure that we did not get any narrowing.  A guidewire  was placed up to the left renal kidney without difficulty and a 5-French  24 cm Polaris stent was placed without any difficulty whatsoever.  Hemostasis was quite good at the end of the  procedure.  We placed a 20-  [place] Foley catheter which drained clear urine.  He was brought to the  recovery room in stable condition.           ______________________________  Valetta Fuller, M.D.  Electronically Signed     DSG/MEDQ  D:  10/21/2006  T:  10/21/2006  Job:  [id number]

## 2010-11-17 NOTE — Op Note (Signed)
NAMESHAHEEN, MENDE NO.:  [id number]   MEDICAL RECORD NO.:  [id number]          PATIENT TYPE:  OIB   LOCATION:  1409                         FACILITY:  [facility]   PHYSICIAN:  Valetta Fuller, M.D.  DATE OF BIRTH:  06/07/1928   DATE OF PROCEDURE:  09/04/2006  DATE OF DISCHARGE:                               OPERATIVE REPORT   PREOPERATIVE DIAGNOSIS:  Bladder tumor.   POSTOPERATIVE DIAGNOSIS:  Bladder tumor.   PROCEDURE PERFORMED:  Cystoscopy with transurethral resection of bladder  tumor and left double-J stent insertion.   SURGEON:  Valetta Fuller, M.D.   ANESTHESIA:  Spinal.   INDICATIONS:  Mr. Erven is a 77 year old male.  Approximately 10 years  ago, he was diagnosed with adenocarcinoma of the prostate and underwent  treatment with radiation therapy.  He has done quite well and has had a  very low PSA.  The patient had some imaging for followup of an abdominal  aneurysm that had been detected.  On that CT, there was a questionable  soft tissue mass emanating from the left lateral wall of his bladder.  The patient does have a previous extensive tobacco use history.  Flexible cystoscopy in the office revealed obvious transitional cell  carcinoma involving a good portion of the left hemi-trigone and lateral  wall of the bladder.  He presents now for resection of the tumor for  staging as well as hopefully to completely eradicate visible tumor.  The  patient appears to understand the advantages and disadvantages of this  surgery and the potential complications that can occur.  He understands  left double-J stent placement may be necessary due to the proximity of  the tumor to the left ureteral orifice.   TECHNIQUE AND FINDINGS:  The patient was brought to the operating room,  where he had successful induction of a spinal anesthetic.  He was placed  in the lithotomy position and prepped and draped in the usual manner.  Cystoscopy was performed.  Again,  extensive transitional cell carcinoma  was noted.  At the time of surgery, this tumor was a bit larger than we  had appreciated in the office.  The tumor itself measured about 4 x 6  cm.  It appeared to be exophytic primarily and papillary.  It extended  from just superior to left ureteral orifice and involved a good portion  of the trigone and then a moderate degree of the left lateral wall from  approximately the 3 o'clock down to the 5 o'clock position.  The tumor  also encroached on the bladder neck.  The tumor itself was resected down  to muscular fibers.  At the completion of the procedure, the ureteral  orifice on the left was preserved, but the overlying mucosa was resected  and my concern was that there might be some eventual scarring in that  area and we felt it prudent to go ahead and place a double-J stent.  After the tumor had been completely resected and the base fulgurated, we  removed all the tissue from the bladder, which was sent for pathologic  analysis.  Grossly, all the tumor had been resected.   We then utilized to standard cystoscopy and placed a guidewire up the  left renal pelvis.  With fluoroscopic guidance, we placed a 24-cm 6-  French double-J stent without difficulty and this was confirmed to be in  good position.  The Foley catheter was inserted at the end of the  procedure.  Because there were some questionable thinned areas of the  lateral wall, we felt it probably prudent not to put in mitomycin,  especially with the double-J stent in place.  There had been no gross  evidence of bladder perforation and certainly the patient's suprapubic  area was quite soft, but we  felt again that it would probably be prudent not to put in mitomycin.  I  do think did eventually Mr. Mccue may need a second look and we can  consider intravesical installation at that time.  He was brought to  recovery room in stable condition with clear urine.            ______________________________  Valetta Fuller, M.D.  Electronically Signed     DSG/MEDQ  D:  09/05/2006  T:  09/05/2006  Job:  [id number]   cc:   Candyce Churn, M.D.  Fax: [phone]

## 2010-11-17 NOTE — Consult Note (Signed)
   NAMEDORAL, VENTRELLA NO.:  [id number]   MEDICAL RECORD NO.:  [id number]                   PATIENT TYPE:  EMS   LOCATION:  [facility]                                 FACILITY:  [facility]   PHYSICIAN:  Mila Homer. Sherlean Foot, M.D.              DATE OF BIRTH:  1927/12/30   DATE OF CONSULTATION:  06/12/2002  DATE OF DISCHARGE:                                   CONSULTATION   CONSULTING DIAGNOSIS:  Left wrist pain.   HISTORY OF PRESENT ILLNESS:  The patient is a 77 year old male who was  decorating for Christmas, fell and tripped, and came down on his left  outstretched hand. His wife brought him to the emergency department.  A  couple stable medical conditions.   ALLERGIES:  NONE.   PHYSICAL EXAMINATION:  GENERAL: Well-developed, well-nourished in no  distress. Afebrile. Vital signs are stable.  EXTREMITIES: Left wrist is really not deformed or swollen on the dorsal  side. On the lower side, it did have some swelling within the carpal tunnel,  but his neurovascular exam was normal.   X-ray showed an impacted dorsally displaced distal radius fracture.   IMPRESSION:  Dorsally displaced distal radius fracture.   PLAN:  Hematoma block was performed, and traction was placed on the arm, and  then a sugar-tong splint was placed and well molded.  Postoperative x-rays  revealed good radial length, some very mild dorsal displacement but well  within acceptable margins. He was given information regarding ice,  elevation, narcotic pain medication, and an appointment with me on Tuesday.                                               Mila Homer. Sherlean Foot, M.D.    SDL/MEDQ  D:  06/12/2002  T:  06/14/2002  Job:  [id number]

## 2010-12-01 ENCOUNTER — Encounter (HOSPITAL_BASED_OUTPATIENT_CLINIC_OR_DEPARTMENT_OTHER): Payer: Medicare Other | Attending: Internal Medicine

## 2010-12-01 DIAGNOSIS — M8708 Idiopathic aseptic necrosis of bone, other site: Secondary | ICD-10-CM | POA: Insufficient documentation

## 2010-12-01 DIAGNOSIS — Y842 Radiological procedure and radiotherapy as the cause of abnormal reaction of the patient, or of later complication, without mention of misadventure at the time of the procedure: Secondary | ICD-10-CM | POA: Insufficient documentation

## 2010-12-01 DIAGNOSIS — C679 Malignant neoplasm of bladder, unspecified: Secondary | ICD-10-CM | POA: Insufficient documentation

## 2010-12-01 DIAGNOSIS — Z8546 Personal history of malignant neoplasm of prostate: Secondary | ICD-10-CM | POA: Insufficient documentation

## 2010-12-01 DIAGNOSIS — C61 Malignant neoplasm of prostate: Secondary | ICD-10-CM | POA: Insufficient documentation

## 2010-12-19 ENCOUNTER — Other Ambulatory Visit: Payer: Self-pay | Admitting: Urology

## 2010-12-19 ENCOUNTER — Encounter (HOSPITAL_COMMUNITY): Payer: Medicare Other

## 2010-12-19 LAB — CBC
MCH: 29.7 pg (ref 26.0–34.0)
MCHC: 31.6 g/dL (ref 30.0–36.0)
Platelets: 249 10*3/uL (ref 150–400)
RDW: 14.7 % (ref 11.5–15.5)

## 2010-12-19 LAB — BASIC METABOLIC PANEL
Calcium: 9.8 mg/dL (ref 8.4–10.5)
GFR calc Af Amer: >60 mL/min (ref >60)
GFR calc non Af Amer: >60 mL/min (ref >60)
Glucose, Bld: 95 mg/dL (ref 70–99)
Sodium: 140 mEq/L (ref 135–145)

## 2010-12-19 LAB — SURGICAL PCR SCREEN: MRSA, PCR: NEGATIVE

## 2010-12-25 ENCOUNTER — Ambulatory Visit (HOSPITAL_COMMUNITY)
Admission: RE | Admit: 2010-12-25 | Discharge: 2010-12-25 | Disposition: A | Discharge status: Home / Self Care | Payer: Medicare Other | Source: Ambulatory Visit | Attending: Urology | Admitting: Urology

## 2010-12-25 ENCOUNTER — Other Ambulatory Visit: Payer: Self-pay | Admitting: Urology

## 2010-12-25 DIAGNOSIS — Z8551 Personal history of malignant neoplasm of bladder: Secondary | ICD-10-CM | POA: Insufficient documentation

## 2010-12-25 DIAGNOSIS — Z923 Personal history of irradiation: Secondary | ICD-10-CM | POA: Insufficient documentation

## 2010-12-25 DIAGNOSIS — N3040 Irradiation cystitis without hematuria: Secondary | ICD-10-CM | POA: Insufficient documentation

## 2010-12-25 DIAGNOSIS — Y842 Radiological procedure and radiotherapy as the cause of abnormal reaction of the patient, or of later complication, without mention of misadventure at the time of the procedure: Secondary | ICD-10-CM | POA: Insufficient documentation

## 2010-12-25 DIAGNOSIS — N35919 Unspecified urethral stricture, male, unspecified site: Secondary | ICD-10-CM | POA: Insufficient documentation

## 2010-12-25 DIAGNOSIS — Z8546 Personal history of malignant neoplasm of prostate: Secondary | ICD-10-CM | POA: Insufficient documentation

## 2010-12-25 DIAGNOSIS — Z01812 Encounter for preprocedural laboratory examination: Secondary | ICD-10-CM | POA: Insufficient documentation

## 2010-12-25 DIAGNOSIS — R339 Retention of urine, unspecified: Secondary | ICD-10-CM | POA: Insufficient documentation

## 2010-12-25 DIAGNOSIS — N419 Inflammatory disease of prostate, unspecified: Secondary | ICD-10-CM | POA: Insufficient documentation

## 2011-01-11 NOTE — Op Note (Signed)
[name], [name] NO.:  [id number]  MEDICAL RECORD NO.:  [id number]  LOCATION:  [facility]                         FACILITY:  [facility]  PHYSICIAN:  Zachary Haas, M.D.  DATE OF BIRTH:  1927-12-13  DATE OF PROCEDURE:  12/25/2010 DATE OF DISCHARGE:                              OPERATIVE REPORT   PREOPERATIVE DIAGNOSES: 1. History of transitional cell carcinoma of the bladder. 2. History of adenocarcinoma of the prostate status post seed     implantation with external beam radiation therapy. 3. Chronic urinary retention 4. Radiation cystitis and prostatitis. 5. Urethral stricture disease. 6. 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  POSTOPERATIVE DIAGNOSES: 1. History of transitional cell carcinoma of the bladder. 2. History of adenocarcinoma of the prostate status post seed     implantation with external beam radiation therapy. 3. Chronic urinary retention 4. Radiation cystitis and prostatitis. 5. Urethral stricture disease. 6. 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  PROCEDURE PERFORMED:  Rigid and flexible cystoscopy via urethra as well as suprapubic tube tract, cystogram, bladder biopsy with fulguration, bladder barbotage for cytology, exchange of the suprapubic tube.  SURGEON:  Zachary Haas, M.D.  ANESTHESIA:  Spinal.  INDICATIONS:  Zachary Haas is an 77 year old male.  He has an extremely complicated urologic history.  This includes a remote history of radiation therapy with interstitial seed implantation for adenocarcinoma of the prostate.  The patient has also had a history of transitional cell carcinoma of the bladder.  His situation for the last 4 to 5 years has been extremely complicated.  He began experiencing passage of necrotic material along with his prostatic seeds several years ago.  He was under the care of physicians at [facility] for a while but then transferred his care back to [place].  The patient has  chronic urinary retention due to severe bladder dysfunction as well as urethral stricture disease.  He has a chronic indwelling suprapubic tube which he changes himself periodically as necessary.  The patient has not had any documented bladder tumors now for several years and his PSA has been acceptable low level.  He is currently without definitive evidence of cancer but has ongoing issues again with severe radiation cystitis and radiation prostatitis and continues to pass some necrotic material as well as occasional seeds and some small amount of stone material from his urethra.  He has had hyperbaric oxygen which did improve his clinical situation greatly.  He is here for clinical reassessment.  TECHNIQUE AND FINDINGS:  The patient was brought to the operating room where he had successful induction of spinal anesthetic.  He was placed in lithotomy position and prepped and draped in the usual manner.  He has received perioperative antibiotics.  PAS compression boots were placed.  Appropriate surgical time-out was performed.  We initiated his assessment with flexible cystoscopy via his urethra.  The patient was noted again to have ongoing significant issues with phimosis.  Anterior urethra was relatively unremarkable but within the bulbar urethra there was some stricturing, although the cystoscope was allowed to be passed into the prostatic urethra.  There, we saw evidence of considerable ongoing necrotic tissue with small calcium deposits  and adherent stone to the walls of the prostatic fossa.  The bladder neck was contracted and we were unable to get into the native bladder from that approach.  Cystogram was then performed with gravity drainage and fluoroscopic interpretation.  The bladder was very small but otherwise smooth in appearance without definitive filling defect other than the Foley balloon.  Reflux up the left ureter could be appreciated.  There was some contrast noted  inferior and potentially posterior potentially consistent with a small fistula to the rectum but it was difficult to determine definitively.  Suprapubic tube was removed.  Rigid cystoscopy was performed of the bladder.  We found the bladder to be markedly contracted.  There were some chronic inflammatory changes but no overt evidence of any malignancy.  Cold cup biopsies were taken in some of the areas of maximum erythema and edema to rule out carcinoma in situ or underlying malignancy and those areas were then fulgurated with the Bugbee electrode.  Saline bladder barbotage was then performed and sent for cytologic analysis.  A new 22-French suprapubic tube, non-latex, was then placed which drained very light pink colored urine.  No obvious complications occurred and the patient was brought to recovery room in stable condition.     Zachary Haas, M.D.     DSG/MEDQ  D:  12/25/2010  T:  12/25/2010  Job:  [id number]  Electronically Signed by Barron Alvine M.D. on 01/11/2011 05:44:20 PM

## 2011-04-02 LAB — BASIC METABOLIC PANEL
Calcium: 9.4
GFR calc Af Amer: >60
GFR calc non Af Amer: 60 — ABNORMAL LOW
Glucose, Bld: 111 — ABNORMAL HIGH
Sodium: 142

## 2011-04-13 LAB — I-STAT 8, (EC8 V) (CONVERTED LAB)
BUN: 17
Bicarbonate: 21.8
HCT: 51
Operator id: 114531
pCO2, Ven: 33.1 — ABNORMAL LOW

## 2012-04-16 ENCOUNTER — Encounter: Payer: Self-pay | Admitting: Vascular Surgery

## 2012-07-18 ENCOUNTER — Ambulatory Visit (INDEPENDENT_AMBULATORY_CARE_PROVIDER_SITE_OTHER): Payer: BC Managed Care – HMO | Admitting: General Surgery

## 2012-07-18 ENCOUNTER — Encounter (INDEPENDENT_AMBULATORY_CARE_PROVIDER_SITE_OTHER): Payer: Self-pay | Admitting: General Surgery

## 2012-07-18 VITALS — BP 136/82 | HR 68 | Temp 98.2°F | Resp 18 | Ht 68.5 in | Wt 238.4 lb

## 2012-07-18 DIAGNOSIS — N360 Urethral fistula: Secondary | ICD-10-CM | POA: Insufficient documentation

## 2012-07-18 NOTE — Progress Notes (Signed)
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   MRN: [id number]  HPI Patient is a very nice 79 year old male, accompanied by his wife, referred by Dr. Isabel Caprice for consideration for diverting colostomy. The patient has a history of adenocarcinoma of the prostate dating back to 1998 when he was treated with external beam plus seed implantation. 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 He has had extensive followup both in [place] and at [facility] with no evidence of recurrent cancer. The patient however has had known severe radiation proctitis with tenesmus and bleeding in the past. He has had a long-standing stricture at his lower rectum/anus which have made bowel movements difficult. For a number of years he has had some urinary sediment and infections that suggested a small rectourethral fistula. These symptoms were not overwhelming for him. However approximately 2 weeks ago he began passing a fairly large chunks of necrotic tissue as well as a large amount of frankly feculent material per his urethra. He has a suprapubic tube in place which takes care of most of his urinary drainage and he is incontinent otherwise. This has been a large volume per his penis. He has had polymicrobial infections noted in his bladder and has been on antibiotics but none currently. No fever chills or abdominal pain. He has not had any previous intra-abdominal surgery appeared Past Medical History  Diagnosis Date  . Personal history of other diseases of circulatory system   . Other malignant neoplasm without specification of site   . Other and unspecified coagulation defects   . Depressive disorder, not elsewhere classified   . Pure hypercholesterolemia   . Radiotherapy   . Rectal fistula   . Family history of stroke (cerebrovascular)     Maternal family history  . Family  history of malignant neoplasm of prostate     Paternal Family history   . FHx: tuberculosis     Paternal family history  . Personal history of tobacco use, presenting hazards to health     1-1 1/2 ppd for 50 years  . Malignant neoplasm of bladder, part unspecified   . Other chronic cystitis   . Intestinovesical fistula   . Gross hematuria   . Malignant neoplasm of prostate   . Myocardial infarction 1992, 1998  . Aortic aneurysm 2008  . Hypertension    Past Surgical History  Procedure Date  . Colon surgery 1966    removed 6 " colon  . Colon surgery 1968    scar tissue  . Balloon dilation 1992    heart attack  . Angioplasty 1993  . Bypass graft 1998    heart attack  . Prostate ca 04/1997    Dr. Dayton Scrape  . Radiation seeding implant 05/1997  . Coloc cauterization 05/1998  . Bladder ca 08/2006     Review of Systems  Constitutional: Positive for activity change and fatigue. Negative for fever.  Respiratory: Positive for cough and shortness of breath. Negative for wheezing and stridor.   Cardiovascular: Negative for chest pain and palpitations.  Gastrointestinal: Positive for constipation and rectal pain. Negative for nausea, vomiting, abdominal pain, diarrhea, blood in stool and abdominal distention.  Genitourinary: Positive for discharge.  Musculoskeletal: Positive for arthralgias and gait problem.  Neurological: Positive for weakness.       Objective:   Physical Exam BP 136/82  Pulse  68  Temp 98.2 F (36.8 C)  Resp 18  Ht 5' 8.5" (1.74 m)  Wt 238 lb 6.4 oz (108.138 kg)  BMI 35.72 kg/m2  General: Elderly pleasant alert somewhat obese Caucasian male Skin: Warm and dry without rash infection HEENT: No palpable masses. Sclera nonicteric, oropharynx clear Lungs: few scattered wheezes. No increased work of breathing Cardiac: Healed sternotomy. Regular rate and rhythm. Trace edema. Abdomen: Somewhat obese. No scars. Soft and nontender. Nondistended. Suprapubic tube in  place draining at this point clear urine. He has a fairly large amount of feculent drainage and a pad from his penis. I did not repeat rectal exam today. Extremities: Trace edema Neurologic: He is alert and fully oriented. No gross motor deficits. Assessment:     Large worsening rectourethral fistula with a large amount of feculent drainage per urethra. At this point he is having as much bowel movements from his urethra as his rectum. He has a long-standing rectal stricture and some increasing difficulty with bowel movements but likely contributes to this problem with high rectal pressures. I believe he would benefit significantly from a diverting colostomy. I discussed this extensively with the patient and his wife. He has significant comorbidities and there would be risk to the procedure. However I don't think his current situation is tolerable. He understands that this would not eliminate all drainage from his urethra as he still likely will have some necrotic tissue but it should markedly improve things. We discussed laparoscopic and diverting colostomy. We discussed the risks of anesthetic complications, cardiovascular complications, bleeding and infection. All their questions were answered they would like to proceed. Dr. Isabel Caprice would like to plan a cystoscopy under the same anesthesia we will coordinate this with him.    Plan:     Laparoscopic diverging sigmoid colostomy. We will coordinate this with Dr. Isabel Caprice who plans a cystoscopy under the same anesthetic. We will have him see the enterostomal nurse preoperatively for marking.

## 2012-07-21 ENCOUNTER — Other Ambulatory Visit: Payer: Self-pay | Admitting: Urology

## 2012-07-23 ENCOUNTER — Encounter (HOSPITAL_COMMUNITY): Payer: Self-pay | Admitting: Pharmacy Technician

## 2012-07-25 ENCOUNTER — Encounter (HOSPITAL_COMMUNITY)
Admission: RE | Admit: 2012-07-25 | Discharge: 2012-07-25 | Disposition: A | Discharge status: Home / Self Care | Payer: Medicare Other | Source: Ambulatory Visit | Attending: General Surgery | Admitting: General Surgery

## 2012-07-25 ENCOUNTER — Encounter (HOSPITAL_COMMUNITY): Payer: Self-pay

## 2012-07-25 ENCOUNTER — Other Ambulatory Visit (INDEPENDENT_AMBULATORY_CARE_PROVIDER_SITE_OTHER): Payer: Self-pay | Admitting: General Surgery

## 2012-07-25 ENCOUNTER — Ambulatory Visit (HOSPITAL_COMMUNITY)
Admission: RE | Admit: 2012-07-25 | Discharge: 2012-07-25 | Disposition: A | Discharge status: Home / Self Care | Payer: Medicare Other | Source: Ambulatory Visit | Attending: General Surgery | Admitting: General Surgery

## 2012-07-25 DIAGNOSIS — N360 Urethral fistula: Secondary | ICD-10-CM | POA: Insufficient documentation

## 2012-07-25 DIAGNOSIS — Z01812 Encounter for preprocedural laboratory examination: Secondary | ICD-10-CM | POA: Insufficient documentation

## 2012-07-25 DIAGNOSIS — I700 Atherosclerosis of aorta: Secondary | ICD-10-CM | POA: Insufficient documentation

## 2012-07-25 DIAGNOSIS — Z951 Presence of aortocoronary bypass graft: Secondary | ICD-10-CM | POA: Insufficient documentation

## 2012-07-25 DIAGNOSIS — Z01810 Encounter for preprocedural cardiovascular examination: Secondary | ICD-10-CM | POA: Insufficient documentation

## 2012-07-25 HISTORY — DX: Gastro-esophageal reflux disease without esophagitis: K21.9

## 2012-07-25 HISTORY — DX: Left bundle-branch block, unspecified: I44.7

## 2012-07-25 HISTORY — DX: Bronchitis, not specified as acute or chronic: J40

## 2012-07-25 HISTORY — DX: Enthesopathy, unspecified: M77.9

## 2012-07-25 HISTORY — DX: Atherosclerotic heart disease of native coronary artery without angina pectoris: I25.10

## 2012-07-25 HISTORY — DX: Unspecified abnormalities of breathing: R06.9

## 2012-07-25 HISTORY — DX: Headache: R51

## 2012-07-25 LAB — BASIC METABOLIC PANEL
Calcium: 9.8 mg/dL (ref 8.4–10.5)
GFR calc Af Amer: 89 mL/min — ABNORMAL LOW (ref >90)
GFR calc non Af Amer: 77 mL/min — ABNORMAL LOW (ref >90)
Potassium: 4.2 mEq/L (ref 3.5–5.1)
Sodium: 142 mEq/L (ref 135–145)

## 2012-07-25 LAB — CBC
MCH: 32.1 pg (ref 26.0–34.0)
MCHC: 32.8 g/dL (ref 30.0–36.0)
Platelets: 258 10*3/uL (ref 150–400)
RDW: 13.7 % (ref 11.5–15.5)

## 2012-07-25 LAB — SURGICAL PCR SCREEN
MRSA, PCR: NEGATIVE
Staphylococcus aureus: NEGATIVE

## 2012-07-25 NOTE — Progress Notes (Signed)
07/25/12 1236  OBSTRUCTIVE SLEEP APNEA  Have you ever been diagnosed with sleep apnea through a sleep study? No  Do you snore loudly (loud enough to be heard through closed doors)?  0  Do you often feel tired, fatigued, or sleepy during the daytime? 0  Has anyone observed you stop breathing during your sleep? 0  Do you have, or are you being treated for high blood pressure? 1  BMI more than 35 kg/m2? 1  Age over 77 years old? 1  Neck circumference greater than 40 cm/18 inches? 0  Gender: 1  Obstructive Sleep Apnea Score 4   Score 4 or greater  Results sent to PCP

## 2012-07-25 NOTE — Anesthesia Preprocedure Evaluation (Addendum)
Anesthesia Evaluation  Patient identified by MRN, date of birth, ID band Patient awake    Reviewed: Allergy & Precautions, H&P , NPO status , Patient's Chart, lab work & pertinent test results, reviewed documented beta blocker date and time   Airway Mallampati: II TM Distance: >3 FB Neck ROM: Full    Dental  (+) Edentulous Upper and Edentulous Lower   Pulmonary former smoker,  breath sounds clear to auscultation  Pulmonary exam normal       Cardiovascular hypertension, Pt. on medications and Pt. on home beta blockers + CAD, + Past MI ('92, '98) and + CABG (1998) - Peripheral Vascular Disease negative cardio ROS  + dysrhythmias Rhythm:Regular Rate:Normal  Echo 07/2010  Left ventricle: Systolic function was normal. The estimated ejection fraction was in the range of 55% to 60%. Although no diagnostic regional wall motion abnormality was identified, this possibility cannot be completely excluded on the basis of this study. Diastolic function appears normal (difficult to visualize  tissue Doppler).  - Aortic valve: Transvalvular velocity was minimally increased, but no significant stenosis.  - Mitral valve: Calcified annulus.  - Right ventricle: The cavity size was mildly dilated.  - Right atrium: The atrium was mildly dilated.     Neuro/Psych  Headaches, PSYCHIATRIC DISORDERS Depression  Neuromuscular disease    GI/Hepatic Neg liver ROS, GERD-  Medicated,  Endo/Other  negative endocrine ROSMorbid obesity  Renal/GU negative Renal ROS     Musculoskeletal negative musculoskeletal ROS (+)   Abdominal   Peds  Hematology negative hematology ROS (+)   Anesthesia Other Findings   Reproductive/Obstetrics                       Anesthesia Physical Anesthesia Plan  ASA: III  Anesthesia Plan: General   Post-op Pain Management:    Induction: Intravenous  Airway Management Planned: Oral ETT  Additional  Equipment:   Intra-op Plan:   Post-operative Plan: Extubation in OR  Informed Consent: I have reviewed the patients History and Physical, chart, labs and discussed the procedure including the risks, benefits and alternatives for the proposed anesthesia with the patient or authorized representative who has indicated his/her understanding and acceptance.   Dental advisory given  Plan Discussed with: CRNA  Anesthesia Plan Comments:      Anesthesia Quick Evaluation

## 2012-07-25 NOTE — Progress Notes (Signed)
EKG from 2011 and 1998 on chart

## 2012-07-25 NOTE — Consult Note (Signed)
WOC ostomy consult : Preoperative Stoma Site Selection Patient seen for preoperative stoma site selection per Dr. Jamse Mead request.  Planned procedure for diverting colostomy scheduled for Monday, February 3rd.  Patient with diagnosis of rectourethral fistula. Education provided: Patient and wife understand procedure planned by Dr. Johna Sheriff.  I explained role of WOC Nurse in the post operative phase and the purpose and limitation of preoperative stoma marking.  Patient's abdomen observed in the seated position.  He wears the waistband of his loungepants well below his umbilicus, the abdomen is rotund, but firm.  Umbilicus with crease at 3 and 9 o'clock.  I have marked the preferred site for stoma location in the left upper quadrant using a skin marking pen and covered it with a thin film dressing.  Loraine Leriche is located 7cm to the left of the umbilicus and 2cm above. Patient and wife state that the wearing of a pouch will be a considerable improvement over the patient's current state where fecal incontinence and feces draining from the penis are profoundly impacting his quality of life. I will follow with you.  Please write consultation order post op. Thanks, Ladona Mow, MSN, RN, [facility], CWOCN [phone])

## 2012-07-25 NOTE — Patient Instructions (Addendum)
[address] Zaydrian Batta Barretto  07/25/2012   Your procedure is scheduled on: 08/04/12  Report to [facility] at 1015AM.  Call this number if you have problems the morning of surgery 336-: [phone]   Remember:   Do not eat food or drink liquids After Midnight.     Take these medicines the morning of surgery with A SIP OF WATER: wellbutrin, isosorbide mono, metoprolol tartrate, simvastatin, vicodin if needed   Do not wear jewelry, make-up or nail polish.  Do not wear lotions, powders, or perfumes. You may wear deodorant.  Do not shave 48 hours prior to surgery. Men may shave face and neck.  Do not bring valuables to the hospital.  Contacts, dentures or bridgework may not be worn into surgery.  Leave suitcase in the car. After surgery it may be brought to your room.  For patients admitted to the hospital, checkout time is 11:00 AM the day of discharge.     Please read over the following fact sheets that you were given: MRSA Information.  Birdie Sons, RN  pre op nurse call if needed [phone]    FAILURE TO FOLLOW THESE INSTRUCTIONS MAY RESULT IN CANCELLATION OF YOUR SURGERY   Patient Signature: ___________________________________________

## 2012-08-03 MED ORDER — DEXTROSE 5 % IV SOLN
2.0000 g | INTRAVENOUS | Status: DC
  Filled 2012-08-03: qty 2

## 2012-08-04 ENCOUNTER — Ambulatory Visit (HOSPITAL_COMMUNITY): Payer: Medicare Other | Admitting: Anesthesiology

## 2012-08-04 ENCOUNTER — Inpatient Hospital Stay (HOSPITAL_COMMUNITY)
Admission: RE | Admit: 2012-08-04 | Discharge: 2012-08-09 | DRG: 983 | Disposition: A | Discharge status: Home Health | Payer: Medicare Other | Source: Ambulatory Visit | Attending: General Surgery | Admitting: General Surgery

## 2012-08-04 ENCOUNTER — Encounter (HOSPITAL_COMMUNITY): Payer: Self-pay | Admitting: *Deleted

## 2012-08-04 ENCOUNTER — Encounter (HOSPITAL_COMMUNITY)
Admission: RE | Disposition: A | Discharge status: Home Health | Payer: Self-pay | Source: Ambulatory Visit | Attending: General Surgery

## 2012-08-04 ENCOUNTER — Encounter (HOSPITAL_COMMUNITY): Payer: Self-pay | Admitting: Anesthesiology

## 2012-08-04 DIAGNOSIS — N360 Urethral fistula: Principal | ICD-10-CM | POA: Diagnosis present

## 2012-08-04 DIAGNOSIS — Z435 Encounter for attention to cystostomy: Secondary | ICD-10-CM

## 2012-08-04 DIAGNOSIS — Z8546 Personal history of malignant neoplasm of prostate: Secondary | ICD-10-CM

## 2012-08-04 DIAGNOSIS — E7800 Pure hypercholesterolemia, unspecified: Secondary | ICD-10-CM | POA: Diagnosis present

## 2012-08-04 DIAGNOSIS — Z6835 Body mass index (BMI) 35.0-35.9, adult: Secondary | ICD-10-CM

## 2012-08-04 DIAGNOSIS — Z8551 Personal history of malignant neoplasm of bladder: Secondary | ICD-10-CM

## 2012-08-04 DIAGNOSIS — F3289 Other specified depressive episodes: Secondary | ICD-10-CM | POA: Diagnosis present

## 2012-08-04 DIAGNOSIS — N210 Calculus in bladder: Secondary | ICD-10-CM | POA: Diagnosis present

## 2012-08-04 DIAGNOSIS — Z87891 Personal history of nicotine dependence: Secondary | ICD-10-CM

## 2012-08-04 DIAGNOSIS — I2510 Atherosclerotic heart disease of native coronary artery without angina pectoris: Secondary | ICD-10-CM | POA: Diagnosis present

## 2012-08-04 DIAGNOSIS — I10 Essential (primary) hypertension: Secondary | ICD-10-CM | POA: Diagnosis present

## 2012-08-04 DIAGNOSIS — F329 Major depressive disorder, single episode, unspecified: Secondary | ICD-10-CM | POA: Diagnosis present

## 2012-08-04 DIAGNOSIS — Z9049 Acquired absence of other specified parts of digestive tract: Secondary | ICD-10-CM

## 2012-08-04 DIAGNOSIS — I252 Old myocardial infarction: Secondary | ICD-10-CM

## 2012-08-04 DIAGNOSIS — Z951 Presence of aortocoronary bypass graft: Secondary | ICD-10-CM

## 2012-08-04 HISTORY — PX: LAPAROSCOPIC DIVERTED COLOSTOMY: SHX5892

## 2012-08-04 HISTORY — PX: CYSTOSTOMY: SHX155

## 2012-08-04 LAB — TYPE AND SCREEN: Antibody Screen: NEGATIVE

## 2012-08-04 LAB — ABO/RH: ABO/RH(D): AB POS

## 2012-08-04 SURGERY — CREATION, COLOSTOMY, DIVERTING, LAPAROSCOPIC
Anesthesia: Regional | Site: Abdomen | Wound class: Clean Contaminated

## 2012-08-04 MED ORDER — PSYLLIUM 0.52 G PO CAPS: 104.0000 g | ORAL_CAPSULE | Freq: Two times a day (BID) | ORAL | Status: DC

## 2012-08-04 MED ORDER — PROPOFOL 10 MG/ML IV BOLUS
INTRAVENOUS | Status: DC | PRN
  Administered 2012-08-04: 110 mg via INTRAVENOUS

## 2012-08-04 MED ORDER — LACTATED RINGERS IR SOLN
Status: DC | PRN
  Administered 2012-08-04: 3000 mL

## 2012-08-04 MED ORDER — ONDANSETRON HCL 4 MG/2ML IJ SOLN
INTRAMUSCULAR | Status: DC | PRN
  Administered 2012-08-04: 4 mg via INTRAVENOUS

## 2012-08-04 MED ORDER — DEXTROSE 5 % IV SOLN
2.0000 g | INTRAVENOUS | Status: DC | PRN
  Administered 2012-08-04: 2 g via INTRAVENOUS

## 2012-08-04 MED ORDER — GLYCOPYRROLATE 0.2 MG/ML IJ SOLN
INTRAMUSCULAR | Status: DC | PRN
  Administered 2012-08-04: 0.6 mg via INTRAVENOUS

## 2012-08-04 MED ORDER — PSYLLIUM 95 % PO PACK
1.0000 | PACK | Freq: Two times a day (BID) | ORAL | Status: DC
  Administered 2012-08-04 – 2012-08-09 (×10): 1 via ORAL
  Filled 2012-08-04 (×11): qty 1

## 2012-08-04 MED ORDER — POTASSIUM CHLORIDE IN NACL 20-0.9 MEQ/L-% IV SOLN
INTRAVENOUS | Status: AC
  Filled 2012-08-04: qty 1000

## 2012-08-04 MED ORDER — NEOSTIGMINE METHYLSULFATE 1 MG/ML IJ SOLN
INTRAMUSCULAR | Status: DC | PRN
  Administered 2012-08-04: 5 mg via INTRAVENOUS

## 2012-08-04 MED ORDER — BUPIVACAINE HCL (PF) 0.5 % IJ SOLN
INTRAMUSCULAR | Status: DC | PRN
  Administered 2012-08-04: 19 mL

## 2012-08-04 MED ORDER — MEPERIDINE HCL 50 MG/ML IJ SOLN: 6.2500 mg | INTRAMUSCULAR | Status: DC | PRN

## 2012-08-04 MED ORDER — CEFOXITIN SODIUM-DEXTROSE 1-4 GM-% IV SOLR (PREMIX)
INTRAVENOUS | Status: AC
  Filled 2012-08-04: qty 100

## 2012-08-04 MED ORDER — ONDANSETRON HCL 4 MG/2ML IJ SOLN: 4.0000 mg | Freq: Four times a day (QID) | INTRAMUSCULAR | Status: DC | PRN

## 2012-08-04 MED ORDER — LIDOCAINE HCL (CARDIAC) 20 MG/ML IV SOLN
INTRAVENOUS | Status: DC | PRN
  Administered 2012-08-04: 100 mg via INTRAVENOUS

## 2012-08-04 MED ORDER — METOPROLOL TARTRATE 25 MG PO TABS
25.0000 mg | ORAL_TABLET | Freq: Every day | ORAL | Status: DC
  Administered 2012-08-05 – 2012-08-09 (×5): 25 mg via ORAL
  Filled 2012-08-04 (×6): qty 1

## 2012-08-04 MED ORDER — HYDROMORPHONE HCL PF 1 MG/ML IJ SOLN
0.2500 mg | INTRAMUSCULAR | Status: DC | PRN
  Administered 2012-08-04: 0.5 mg via INTRAVENOUS

## 2012-08-04 MED ORDER — HEPARIN SODIUM (PORCINE) 5000 UNIT/ML IJ SOLN
5000.0000 [IU] | Freq: Three times a day (TID) | INTRAMUSCULAR | Status: DC
  Administered 2012-08-05 – 2012-08-09 (×13): 5000 [IU] via SUBCUTANEOUS
  Filled 2012-08-04 (×16): qty 1

## 2012-08-04 MED ORDER — ONDANSETRON HCL 4 MG PO TABS: 4.0000 mg | ORAL_TABLET | Freq: Four times a day (QID) | ORAL | Status: DC | PRN

## 2012-08-04 MED ORDER — PROMETHAZINE HCL 25 MG/ML IJ SOLN: 6.2500 mg | INTRAMUSCULAR | Status: DC | PRN

## 2012-08-04 MED ORDER — ACETAMINOPHEN 10 MG/ML IV SOLN
INTRAVENOUS | Status: AC
  Filled 2012-08-04: qty 100

## 2012-08-04 MED ORDER — ISOSORBIDE MONONITRATE ER 30 MG PO TB24
30.0000 mg | ORAL_TABLET | Freq: Every day | ORAL | Status: DC
  Administered 2012-08-05 – 2012-08-09 (×5): 30 mg via ORAL
  Filled 2012-08-04 (×6): qty 1

## 2012-08-04 MED ORDER — HYDROMORPHONE HCL PF 1 MG/ML IJ SOLN
INTRAMUSCULAR | Status: AC
  Filled 2012-08-04: qty 1

## 2012-08-04 MED ORDER — EPHEDRINE SULFATE 50 MG/ML IJ SOLN
INTRAMUSCULAR | Status: DC | PRN
  Administered 2012-08-04: 5 mg via INTRAVENOUS

## 2012-08-04 MED ORDER — HYDROCODONE-ACETAMINOPHEN 5-325 MG PO TABS
1.0000 | ORAL_TABLET | ORAL | Status: DC | PRN
  Administered 2012-08-04: 2 via ORAL
  Administered 2012-08-05 – 2012-08-06 (×6): 1 via ORAL
  Administered 2012-08-07: 2 via ORAL
  Administered 2012-08-07 – 2012-08-09 (×3): 1 via ORAL
  Filled 2012-08-04 (×3): qty 1
  Filled 2012-08-04: qty 2
  Filled 2012-08-04 (×3): qty 1
  Filled 2012-08-04 (×2): qty 2
  Filled 2012-08-04: qty 1
  Filled 2012-08-04: qty 2

## 2012-08-04 MED ORDER — DOCUSATE SODIUM 100 MG PO CAPS
300.0000 mg | ORAL_CAPSULE | Freq: Every day | ORAL | Status: DC
  Administered 2012-08-04 – 2012-08-09 (×6): 300 mg via ORAL
  Filled 2012-08-04 (×6): qty 3

## 2012-08-04 MED ORDER — POTASSIUM CHLORIDE IN NACL 20-0.9 MEQ/L-% IV SOLN
INTRAVENOUS | Status: DC
  Administered 2012-08-04 – 2012-08-08 (×7): via INTRAVENOUS
  Filled 2012-08-04 (×6): qty 1000

## 2012-08-04 MED ORDER — SIMVASTATIN 40 MG PO TABS
40.0000 mg | ORAL_TABLET | Freq: Every day | ORAL | Status: DC
  Administered 2012-08-05 – 2012-08-09 (×5): 40 mg via ORAL
  Filled 2012-08-04 (×7): qty 1

## 2012-08-04 MED ORDER — STERILE WATER FOR IRRIGATION IR SOLN
Status: DC | PRN
  Administered 2012-08-04: 3000 mL via INTRAVESICAL

## 2012-08-04 MED ORDER — MORPHINE SULFATE 2 MG/ML IJ SOLN: 2.0000 mg | INTRAMUSCULAR | Status: DC | PRN

## 2012-08-04 MED ORDER — BUPROPION HCL 100 MG PO TABS
100.0000 mg | ORAL_TABLET | Freq: Two times a day (BID) | ORAL | Status: DC
  Administered 2012-08-04 – 2012-08-09 (×10): 100 mg via ORAL
  Filled 2012-08-04 (×11): qty 1

## 2012-08-04 MED ORDER — SUCCINYLCHOLINE CHLORIDE 20 MG/ML IJ SOLN
INTRAMUSCULAR | Status: DC | PRN
  Administered 2012-08-04: 100 mg via INTRAVENOUS

## 2012-08-04 MED ORDER — FENTANYL CITRATE 0.05 MG/ML IJ SOLN
INTRAMUSCULAR | Status: DC | PRN
  Administered 2012-08-04: 50 ug via INTRAVENOUS
  Administered 2012-08-04: 25 ug via INTRAVENOUS
  Administered 2012-08-04 (×2): 50 ug via INTRAVENOUS

## 2012-08-04 MED ORDER — ACETAMINOPHEN 10 MG/ML IV SOLN: 1000.0000 mg | Freq: Once | INTRAVENOUS | Status: DC | PRN

## 2012-08-04 MED ORDER — ROCURONIUM BROMIDE 100 MG/10ML IV SOLN
INTRAVENOUS | Status: DC | PRN
  Administered 2012-08-04: 5 mg via INTRAVENOUS
  Administered 2012-08-04: 40 mg via INTRAVENOUS
  Administered 2012-08-04: 5 mg via INTRAVENOUS

## 2012-08-04 MED ORDER — CITALOPRAM HYDROBROMIDE 20 MG PO TABS
20.0000 mg | ORAL_TABLET | Freq: Every evening | ORAL | Status: DC
  Administered 2012-08-04 – 2012-08-08 (×5): 20 mg via ORAL
  Filled 2012-08-04 (×6): qty 1

## 2012-08-04 MED ORDER — ACETAMINOPHEN 10 MG/ML IV SOLN
INTRAVENOUS | Status: DC | PRN
  Administered 2012-08-04: 1000 mg via INTRAVENOUS

## 2012-08-04 MED ORDER — BUPIVACAINE HCL 0.5 % IJ SOLN
INTRAMUSCULAR | Status: AC
  Filled 2012-08-04: qty 1

## 2012-08-04 MED ORDER — LACTATED RINGERS IV SOLN
INTRAVENOUS | Status: DC
  Administered 2012-08-04 (×2): via INTRAVENOUS

## 2012-08-04 MED ORDER — DEXAMETHASONE SODIUM PHOSPHATE 10 MG/ML IJ SOLN
INTRAMUSCULAR | Status: DC | PRN
  Administered 2012-08-04: 10 mg via INTRAVENOUS

## 2012-08-04 SURGICAL SUPPLY — 97 items
APPLIER CLIP 5 13 M/L LIGAMAX5 (MISCELLANEOUS)
APPLIER CLIP ROT 10 11.4 M/L (STAPLE)
BAG URINE DRAINAGE (UROLOGICAL SUPPLIES) IMPLANT
BAG URINE LEG 500ML (DRAIN) IMPLANT
BLADE HEX COATED 2.75 (ELECTRODE) ×2 IMPLANT
BLADE SURG 15 STRL LF DISP TIS (BLADE) ×1 IMPLANT
BLADE SURG 15 STRL SS (BLADE) ×1
BLADE SURG ROTATE 9660 (MISCELLANEOUS) IMPLANT
BLADE SURG SZ10 CARB STEEL (BLADE) ×2 IMPLANT
CATH SILASTIC FOLEY 22FRX30CC (CATHETERS) ×2 IMPLANT
CELLS DAT CNTRL 66122 CELL SVR (MISCELLANEOUS) IMPLANT
CHLORAPREP W/TINT 26ML (MISCELLANEOUS) ×2 IMPLANT
CLIP APPLIE 5 13 M/L LIGAMAX5 (MISCELLANEOUS) IMPLANT
CLIP APPLIE ROT 10 11.4 M/L (STAPLE) IMPLANT
CLOTH BEACON ORANGE TIMEOUT ST (SAFETY) ×4 IMPLANT
COVER MAYO STAND STRL (DRAPES) ×2 IMPLANT
COVER SURGICAL LIGHT HANDLE (MISCELLANEOUS) ×4 IMPLANT
CUTTER LINEAR ENDO ART 45 ETS (STAPLE) ×2 IMPLANT
DERMABOND ADVANCED (GAUZE/BANDAGES/DRESSINGS) ×1
DERMABOND ADVANCED .7 DNX12 (GAUZE/BANDAGES/DRESSINGS) ×1 IMPLANT
DRAPE CAMERA CLOSED 9X96 (DRAPES) ×2 IMPLANT
DRAPE LAPAROSCOPIC ABDOMINAL (DRAPES) ×2 IMPLANT
DRAPE LG THREE QUARTER DISP (DRAPES) IMPLANT
DRAPE WARM FLUID 44X44 (DRAPE) ×2 IMPLANT
ELECT REM PT RETURN 9FT ADLT (ELECTROSURGICAL) ×2
ELECTRODE REM PT RTRN 9FT ADLT (ELECTROSURGICAL) ×1 IMPLANT
ENDOLOOP SUT PDS II  0 18 (SUTURE)
ENDOLOOP SUT PDS II 0 18 (SUTURE) IMPLANT
GLOVE BIOGEL M STRL SZ7.5 (GLOVE) ×2 IMPLANT
GOWN PREVENTION PLUS XLARGE (GOWN DISPOSABLE) ×2 IMPLANT
GOWN STRL NON-REIN LRG LVL3 (GOWN DISPOSABLE) ×2 IMPLANT
GOWN STRL REIN XL XLG (GOWN DISPOSABLE) ×8 IMPLANT
KIT BASIN OR (CUSTOM PROCEDURE TRAY) ×2 IMPLANT
KIT SUPRAPUBIC CATH (MISCELLANEOUS) IMPLANT
LEGGING LITHOTOMY PAIR STRL (DRAPES) ×2 IMPLANT
LIGASURE IMPACT 36 18CM CVD LR (INSTRUMENTS) IMPLANT
MANIFOLD NEPTUNE II (INSTRUMENTS) ×2 IMPLANT
MARKER SKIN DUAL TIP RULER LAB (MISCELLANEOUS) ×2 IMPLANT
NEEDLE HYPO 22GX1.5 SAFETY (NEEDLE) IMPLANT
NS IRRIG 1000ML POUR BTL (IV SOLUTION) ×4 IMPLANT
PACK CYSTO (CUSTOM PROCEDURE TRAY) ×2 IMPLANT
PENCIL BUTTON HOLSTER BLD 10FT (ELECTRODE) ×2 IMPLANT
PLUG CATH AND CAP STER (CATHETERS) IMPLANT
POUCH OSTOMY 2  H (OSTOMY) ×2 IMPLANT
RELOAD STAPLE TA45 3.5 REG BLU (ENDOMECHANICALS) ×2 IMPLANT
RTRCTR WOUND ALEXIS 18CM MED (MISCELLANEOUS)
SCALPEL HARMONIC ACE (MISCELLANEOUS) IMPLANT
SCISSORS LAP 5X35 DISP (ENDOMECHANICALS) ×2 IMPLANT
SEALER TISSUE G2 CVD JAW 35 (ENDOMECHANICALS) IMPLANT
SEALER TISSUE G2 CVD JAW 45CM (ENDOMECHANICALS)
SET IRRIG TUBING LAPAROSCOPIC (IRRIGATION / IRRIGATOR) IMPLANT
SLEEVE ADV FIXATION 12X100MM (TROCAR) IMPLANT
SLEEVE ADV FIXATION 5X100MM (TROCAR) IMPLANT
SLEEVE SURGEON STRL (DRAPES) ×2 IMPLANT
SLEEVE Z-THREAD 12X100MM (TROCAR) IMPLANT
SLEEVE Z-THREAD 5X100MM (TROCAR) IMPLANT
SOLUTION ANTI FOG 6CC (MISCELLANEOUS) ×2 IMPLANT
SPONGE GAUZE 4X4 12PLY (GAUZE/BANDAGES/DRESSINGS) ×2 IMPLANT
SPONGE LAP 18X18 X RAY DECT (DISPOSABLE) ×2 IMPLANT
STAPLER VISISTAT 35W (STAPLE) ×2 IMPLANT
SUCTION POOLE TIP (SUCTIONS) ×2 IMPLANT
SUT MNCRL AB 4-0 PS2 18 (SUTURE) ×2 IMPLANT
SUT PDS AB 1 CT1 27 (SUTURE) IMPLANT
SUT PDS AB 1 CTX 36 (SUTURE) IMPLANT
SUT PDS AB 1 TP1 96 (SUTURE) IMPLANT
SUT PROLENE 0 CT 1 CR/8 (SUTURE) ×2 IMPLANT
SUT SILK 2 0 (SUTURE) ×1
SUT SILK 2 0 30  PSL (SUTURE) ×1
SUT SILK 2 0 30 PSL (SUTURE) ×1 IMPLANT
SUT SILK 2 0 SH CR/8 (SUTURE) ×2 IMPLANT
SUT SILK 2-0 18XBRD TIE 12 (SUTURE) ×1 IMPLANT
SUT SILK 3 0 (SUTURE) ×1
SUT SILK 3 0 SH CR/8 (SUTURE) ×2 IMPLANT
SUT SILK 3-0 18XBRD TIE 12 (SUTURE) ×1 IMPLANT
SUT VIC AB 3-0 SH 18 (SUTURE) ×4 IMPLANT
SYR BULB IRRIGATION 50ML (SYRINGE) ×2 IMPLANT
SYR CONTROL 10ML LL (SYRINGE) IMPLANT
SYS LAPSCP GELPORT 120MM (MISCELLANEOUS)
SYSTEM LAPSCP GELPORT 120MM (MISCELLANEOUS) IMPLANT
TOWEL OR 17X26 10 PK STRL BLUE (TOWEL DISPOSABLE) ×6 IMPLANT
TRAY FOLEY CATH 14FRSI W/METER (CATHETERS) ×2 IMPLANT
TRAY LAP CHOLE (CUSTOM PROCEDURE TRAY) ×2 IMPLANT
TROCAR ADV FIXATION 11X100MM (TROCAR) IMPLANT
TROCAR ADV FIXATION 12X100MM (TROCAR) IMPLANT
TROCAR ADV FIXATION 5X100MM (TROCAR) IMPLANT
TROCAR BLADELESS OPT 5 100 (ENDOMECHANICALS) ×2 IMPLANT
TROCAR BLADELESS OPT 5 75 (ENDOMECHANICALS) IMPLANT
TROCAR XCEL BLUNT TIP 100MML (ENDOMECHANICALS) IMPLANT
TROCAR XCEL NON-BLD 11X100MML (ENDOMECHANICALS) IMPLANT
TROCAR Z-THREAD FIOS 11X100 BL (TROCAR) IMPLANT
TROCAR Z-THREAD FIOS 12X100MM (TROCAR) IMPLANT
TROCAR Z-THREAD FIOS 5X100MM (TROCAR) IMPLANT
TROCAR Z-THREAD SLEEVE 11X100 (TROCAR) IMPLANT
TUBING INSUFFLATION 10FT LAP (TUBING) ×2 IMPLANT
WATER STERILE IRR 3000ML UROMA (IV SOLUTION) ×2 IMPLANT
YANKAUER SUCT BULB TIP 10FT TU (MISCELLANEOUS) ×2 IMPLANT
YANKAUER SUCT BULB TIP NO VENT (SUCTIONS) ×2 IMPLANT

## 2012-08-04 NOTE — Interval H&P Note (Signed)
History and Physical Interval Note:  08/04/2012 12:00 PM  Zachary Haas  has presented today for surgery, with the diagnosis of retro-urethral fistula  The various methods of treatment have been discussed with the patient and family. After consideration of risks, benefits and other options for treatment, the patient has consented to  Procedure(s) (LRB) with comments: LAPAROSCOPIC DIVERTED COLOSTOMY (N/A) - Laparoscopic Colostomy CYSTOSTOMY SUPRAPUBIC (N/A) - Cysto via Supra Pubic Tract and Insertion of New Suprapubic Tube, possible Bladder Biopsy as a surgical intervention .  The patient's history has been reviewed, patient examined, no change in status, stable for surgery.  I have reviewed the patient's chart and labs.  Questions were answered to the patient's satisfaction.     [name]

## 2012-08-04 NOTE — Transfer of Care (Signed)
Immediate Anesthesia Transfer of Care Note  Patient: Zachary Haas  Procedure(s) Performed: Procedure(s) (LRB) with comments: LAPAROSCOPIC DIVERTED COLOSTOMY (N/A) - Laparoscopic Colostomy, surgery start time 13:24 CYSTOSTOMY SUPRAPUBIC (N/A) - Cysto via Supra Pubic Tract and Insertion of New Suprapubic Tube, possible Bladder Biopsy  Patient Location: PACU  Anesthesia Type:General  Level of Consciousness: sedated  Airway & Oxygen Therapy: Patient Spontanous Breathing and Patient connected to face mask oxygen  Post-op Assessment: Report given to PACU RN and Post -op Vital signs reviewed and stable  Post vital signs: Reviewed and stable  Complications: No apparent anesthesia complications

## 2012-08-04 NOTE — Anesthesia Postprocedure Evaluation (Signed)
Anesthesia Post Note  Patient: Zachary Haas  Procedure(s) Performed: Procedure(s) (LRB): LAPAROSCOPIC DIVERTED COLOSTOMY (N/A) CYSTOSTOMY SUPRAPUBIC (N/A)  Anesthesia type: General  Patient location: PACU  Post pain: Pain level controlled  Post assessment: Post-op Vital signs reviewed  Last Vitals: BP 103/45  Pulse 64  Temp 36.7 C  Resp 9  SpO2 95%  Post vital signs: Reviewed  Level of consciousness: sedated  Complications: No apparent anesthesia complications

## 2012-08-04 NOTE — Op Note (Signed)
Preoperative Diagnosis: retro-urethral fistula  Postoprative Diagnosis: retro-urethral fistula  Procedure: Procedure(s): LAPAROSCOPIC DIVERTING SIGMOID COLOSTOMY    Surgeon: Glenna Fellows T   Assistants: None  Anesthesia:  General endotracheal anesthesiaDiagnos  Indications:   Patient is an 79 year old male with a complex previous history of radiation treatment for cancer of the prostate who has developed a progressively worsening rectourethral fistula and prostatic necrosis with now a large fistula from his rectum 2 urethra with large volume feculent drainage from his urethra. We have recommended diverting sigmoid colostomy. The indications for the procedure and its nature and risks have been discussed in detailed extensively elsewhere and he is in agreement is brought to the operating room for this procedure.  Procedure Detail:  Following completion of cystoscopy by Dr. Isabel Caprice the patient is placed in supine position and the abdomen widely sterilely prepped and draped. He had received preoperative IV antibiotics. Patient timeout was performed and correct procedure verified. Access was obtained in the right upper quadrant mid abdomen with a 5 mm Optiview trocar without difficulty and pneumoperitoneum established. Under direct vision an additional 5 mm trocar was placed in the right midabdomen and a 12 mm trocar in the right lower quadrant. The viscera were examined and the sigmoid and left colon showed some diverticulosis but no other apparent abnormalities. Following this the sigmoid and left colon were extensively mobilized dividing lateral peritoneal attachments and bluntly dissecting the mesentery over toward the midline. This continued until I felt I had very good mobility of the sigmoid colon and a point for division at the mid to mid distal sigmoid colon was chosen this was cleaned of mesentery and the colon divided with a single firing of the Endo GIA blue load 45 mm stapler. The  proximal end of the colon was then further mobilized providing a short segment of mesentery and some further lateral attachments until I felt I had good mobility at the previously marked colostomy site in the left periumbilical area. A skin and subcutaneous spot and was excised from the colostomy site and the fascia incised in a cruciate fashion and the rectus muscle bluntly split. The peritoneum was opened and dilated and an Allis clamp was used to grasp the proximal end of the colon under direct laparoscopic vision and it was brought up through the stoma site with adequate mobility. Following this the laparoscopic trochars were removed and these incisions were closed with subcuticular Monocryl and Dermabond. The ostomy was then matured with full-thickness 3-0 Vicryl sutures and some eversion. The ostomy appeared well perfused although it appeared to be some melanosis coli darkening the mucosa but the serosa appeared quite pink and well perfused. Sponge needle and counts were correct. Ostomy device was applied the patient to recovery in good condition.   Estimated Blood Loss:  Minimal         Drains: None  Blood Given: none          Specimens: None        Complications:  * No complications entered in OR log *         Disposition: PACU - hemodynamically stable.         Condition: stable

## 2012-08-04 NOTE — H&P (View-Only) (Signed)
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   MRN: [id number]  HPI Patient is a very nice 79-year-old male, accompanied by his wife, referred by Dr. Grapey for consideration for diverting colostomy. The patient has a history of adenocarcinoma of the prostate dating back to 1998 when he was treated with external beam plus seed implantation. 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 He has had extensive followup both in [place] and at [facility] with no evidence of recurrent cancer. The patient however has had known severe radiation proctitis with tenesmus and bleeding in the past. He has had a long-standing stricture at his lower rectum/anus which have made bowel movements difficult. For a number of years he has had some urinary sediment and infections that suggested a small rectourethral fistula. These symptoms were not overwhelming for him. However approximately 2 weeks ago he began passing a fairly large chunks of necrotic tissue as well as a large amount of frankly feculent material per his urethra. He has a suprapubic tube in place which takes care of most of his urinary drainage and he is incontinent otherwise. This has been a large volume per his penis. He has had polymicrobial infections noted in his bladder and has been on antibiotics but none currently. No fever chills or abdominal pain. He has not had any previous intra-abdominal surgery appeared Past Medical History  Diagnosis Date  . Personal history of other diseases of circulatory system   . Other malignant neoplasm without specification of site   . Other and unspecified coagulation defects   . Depressive disorder, not elsewhere classified   . Pure hypercholesterolemia   . Radiotherapy   . Rectal fistula   . Family history of stroke (cerebrovascular)     Maternal family history  . Family  history of malignant neoplasm of prostate     Paternal Family history   . FHx: tuberculosis     Paternal family history  . Personal history of tobacco use, presenting hazards to health     1-1 1/2 ppd for 50 years  . Malignant neoplasm of bladder, part unspecified   . Other chronic cystitis   . Intestinovesical fistula   . Gross hematuria   . Malignant neoplasm of prostate   . Myocardial infarction 1992, 1998  . Aortic aneurysm 2008  . Hypertension    Past Surgical History  Procedure Date  . Colon surgery 1966    removed 6 " colon  . Colon surgery 1968    scar tissue  . Balloon dilation 1992    heart attack  . Angioplasty 1993  . Bypass graft 1998    heart attack  . Prostate ca 04/1997    Dr. Murray  . Radiation seeding implant 05/1997  . Coloc cauterization 05/1998  . Bladder ca 08/2006     Review of Systems  Constitutional: Positive for activity change and fatigue. Negative for fever.  Respiratory: Positive for cough and shortness of breath. Negative for wheezing and stridor.   Cardiovascular: Negative for chest pain and palpitations.  Gastrointestinal: Positive for constipation and rectal pain. Negative for nausea, vomiting, abdominal pain, diarrhea, blood in stool and abdominal distention.  Genitourinary: Positive for discharge.  Musculoskeletal: Positive for arthralgias and gait problem.  Neurological: Positive for weakness.       Objective:   Physical Exam BP 136/82  Pulse   68  Temp 98.2 F (36.8 C)  Resp 18  Ht 5' 8.5" (1.74 m)  Wt 238 lb 6.4 oz (108.138 kg)  BMI 35.72 kg/m2  General: Elderly pleasant alert somewhat obese Caucasian male Skin: Warm and dry without rash infection HEENT: No palpable masses. Sclera nonicteric, oropharynx clear Lungs: few scattered wheezes. No increased work of breathing Cardiac: Healed sternotomy. Regular rate and rhythm. Trace edema. Abdomen: Somewhat obese. No scars. Soft and nontender. Nondistended. Suprapubic tube in  place draining at this point clear urine. He has a fairly large amount of feculent drainage and a pad from his penis. I did not repeat rectal exam today. Extremities: Trace edema Neurologic: He is alert and fully oriented. No gross motor deficits. Assessment:     Large worsening rectourethral fistula with a large amount of feculent drainage per urethra. At this point he is having as much bowel movements from his urethra as his rectum. He has a long-standing rectal stricture and some increasing difficulty with bowel movements but likely contributes to this problem with high rectal pressures. I believe he would benefit significantly from a diverting colostomy. I discussed this extensively with the patient and his wife. He has significant comorbidities and there would be risk to the procedure. However I don't think his current situation is tolerable. He understands that this would not eliminate all drainage from his urethra as he still likely will have some necrotic tissue but it should markedly improve things. We discussed laparoscopic and diverting colostomy. We discussed the risks of anesthetic complications, cardiovascular complications, bleeding and infection. All their questions were answered they would like to proceed. Dr. Grapey would like to plan a cystoscopy under the same anesthesia we will coordinate this with him.    Plan:     Laparoscopic diverging sigmoid colostomy. We will coordinate this with Dr. Grapey who plans a cystoscopy under the same anesthetic. We will have him see the enterostomal nurse preoperatively for marking.      

## 2012-08-05 ENCOUNTER — Encounter (HOSPITAL_COMMUNITY): Payer: Self-pay | Admitting: General Surgery

## 2012-08-05 LAB — CBC
HCT: 38.1 % — ABNORMAL LOW (ref 39.0–52.0)
Hemoglobin: 12.6 g/dL — ABNORMAL LOW (ref 13.0–17.0)
MCH: 31.8 pg (ref 26.0–34.0)
RBC: 3.96 MIL/uL — ABNORMAL LOW (ref 4.22–5.81)

## 2012-08-05 LAB — BASIC METABOLIC PANEL
BUN: 11 mg/dL (ref 6–23)
CO2: 26 mEq/L (ref 19–32)
Calcium: 8.6 mg/dL (ref 8.4–10.5)
Glucose, Bld: 134 mg/dL — ABNORMAL HIGH (ref 70–99)
Sodium: 133 mEq/L — ABNORMAL LOW (ref 135–145)

## 2012-08-05 MED ORDER — PHENOL 1.4 % MT LIQD
1.0000 | OROMUCOSAL | Status: DC | PRN
  Filled 2012-08-05 (×2): qty 177

## 2012-08-05 NOTE — Consult Note (Addendum)
WOC ostomy consult  Stoma type/location: LUQ Colostomy Stomal assessment/size: approximately 1 and 1/2 inches round, budded.  Dark, but viable. Peristomal assessment: not seen today.  Two unroofed blisters at outer edges of pouch tape.  Patient states that operative pouch tape blistered his skin. Pouching system was changed today with Dr. Johna Sheriff present. Treatment options for stomal/peristomal skin: none at this time. Output scant bloody Ostomy pouching: 2pc., 2 and 1/4 inch system in place-applied today.  I will obtain supply and plan for change on Thursday or Friday of this week. Education provided: Patient taught basic GI A&P. Also stoma and pouch characteristics.  Patient has already viewed video of patient on internet changing her ostomy pouch.  Relays that he has self-cathed for years and he suspects that that was a harder skill to learn than pouching his ostomy will be.  I look forward to continuing to work with Zachary Haas and will see again tomorrow. Thanks, Ladona Mow, MSN, RN, [facility], CWOCN [phone])

## 2012-08-05 NOTE — Evaluation (Signed)
Physical Therapy Evaluation Patient Details Name: Zachary Haas MRN: [id number] DOB: 1927/08/10 Today's Date: 08/05/2012 Time: 2725-3664 PT Time Calculation (min): 37 min  PT Assessment / Plan / Recommendation Clinical Impression  Pt presents s/p laparoscopic diverted colostomy with history of adenocarcinoma of prostate.  Tolerated OOB and ambulation in hallway with RW at min assist level for safety and steadying on 3L O2 with SaO2 in upper 80's at end of session.  Had performed bed mobility on RA to assess and pts O2 sats dropped into upper 70's, therefore ambulated with oxygen donned.  Pt will benefit from skilled PT in acute venue to address deficits.  PT recommends HHPT w/ 24/7 supervision/assist to maximize pts safety and function.     PT Assessment  Patient needs continued PT services    Follow Up Recommendations  Home health PT;Supervision/Assistance - 24 hour    Does the patient have the potential to tolerate intense rehabilitation      Barriers to Discharge None      Equipment Recommendations  None recommended by PT    Recommendations for Other Services OT consult   Frequency Min 3X/week    Precautions / Restrictions Precautions Precautions: Fall Precaution Comments: Monitor O2, colostomy, noted area of dark skin on buttocks, however nurse tech states that she/nursing is aware.  Restrictions Weight Bearing Restrictions: No   Pertinent Vitals/Pain Some abdominal discomfort, esp when sitting up.       Mobility  Bed Mobility Bed Mobility: Supine to Sit;Sitting - Scoot to Edge of Bed Supine to Sit: 3: Mod assist;HOB elevated;With rails Sitting - Scoot to Edge of Bed: 5: Supervision Details for Bed Mobility Assistance: Assist for trunk to attain sitting position due to pain in abdomen.  Cues for hand placement on bed to self assist.  Transfers Transfers: Sit to Stand;Stand to Sit Sit to Stand: 4: Min guard;With upper extremity assist;From elevated surface;From  bed;From chair/3-in-1;With armrests Stand to Sit: 4: Min guard;With upper extremity assist;With armrests;To chair/3-in-1;To bed Details for Transfer Assistance: Performed several times in order to assist with self care and also for brief placement (used mesh underwear with chuck pad).  Min/guard with cues for hand placement and safety.  Pt somewhat impulsive to stand without therapist being ready.  Ambulation/Gait Ambulation/Gait Assistance: 4: Min assist;4: Min Government social research officer (Feet): 200 Feet Assistive device: Rolling walker Ambulation/Gait Assistance Details: Min/guard to min assist for steadying throughout with cues for upright posture, maintaining position inside of RW and to perform pursed lip breathing with ambulation.  Pt with tendency to be very talkative during ambulation and get very dyspneic and requires cues for pursed lip breathing and to stop to take rest breaks until breathing is under control.   Gait Pattern: Step-through pattern;Decreased stride length;Trunk flexed Gait velocity: decreased Stairs: No Wheelchair Mobility Wheelchair Mobility: No    Exercises     PT Diagnosis: Difficulty walking;Generalized weakness;Acute pain  PT Problem List: Decreased strength;Decreased activity tolerance;Decreased balance;Decreased mobility;Decreased knowledge of use of DME;Decreased safety awareness;Decreased knowledge of precautions;Pain;Decreased skin integrity PT Treatment Interventions: DME instruction;Gait training;Functional mobility training;Therapeutic activities;Therapeutic exercise;Balance training;Patient/family education   PT Goals Acute Rehab PT Goals PT Goal Formulation: With patient/family Time For Goal Achievement: 08/19/12 Potential to Achieve Goals: Good Pt will go Supine/Side to Sit: with supervision PT Goal: Supine/Side to Sit - Progress: Goal set today Pt will go Sit to Supine/Side: with supervision PT Goal: Sit to Supine/Side - Progress: Goal set  today Pt will go Sit to Stand: with supervision  PT Goal: Sit to Stand - Progress: Goal set today Pt will go Stand to Sit: with supervision PT Goal: Stand to Sit - Progress: Goal set today Pt will Ambulate: >150 feet;with supervision;with least restrictive assistive device PT Goal: Ambulate - Progress: Goal set today Pt will Perform Home Exercise Program: with supervision, verbal cues required/provided PT Goal: Perform Home Exercise Program - Progress: Goal set today  Visit Information  Last PT Received On: 08/05/12 Assistance Needed: +1    Subjective Data  Subjective: I'm embarrassed about all of this.  Patient Stated Goal: to get stronger.    Prior Functioning  Home Living Lives With: Spouse;Son Available Help at Discharge: Available 24 hours/day (wife available 24/7) Type of Home: House Home Access: Level entry Home Layout: One level Bathroom Shower/Tub: Health visitor: Handicapped height Home Adaptive Equipment: Grab bars in shower;Built-in shower seat;Grab bars around toilet;Walker - rolling;Straight cane Prior Function Level of Independence: Needs assistance Able to Take Stairs?: No Vocation: Retired Comments: Pt was requiring assist recently Communication Communication: HOH    Cognition  Cognition Overall Cognitive Status: Appears within functional limits for tasks assessed/performed Arousal/Alertness: Awake/alert Orientation Level: Appears intact for tasks assessed Behavior During Session: [facility] for tasks performed    Extremity/Trunk Assessment Right Lower Extremity Assessment RLE ROM/Strength/Tone: WFL for tasks assessed RLE Sensation: WFL - Light Touch Left Lower Extremity Assessment LLE ROM/Strength/Tone: WFL for tasks assessed LLE Sensation: WFL - Light Touch Trunk Assessment Trunk Assessment: Kyphotic   Balance Balance Balance Assessed: Yes Static Standing Balance Static Standing - Balance Support: Bilateral upper extremity  supported Static Standing - Level of Assistance: 5: Stand by assistance Static Standing - Comment/# of Minutes: Pt able to stand at RW while PT/RN tech assist with self care.   End of Session PT - End of Session Equipment Utilized During Treatment: Gait belt;Oxygen Activity Tolerance: Patient limited by fatigue Patient left: in chair;with call bell/phone within reach;with family/visitor present Nurse Communication: Mobility status;Other (comment) (O2 sats)  GP     Ayyub Krall, Meribeth Mattes 08/05/2012, 2:56 PM

## 2012-08-05 NOTE — Op Note (Signed)
Preoperative diagnosis:rectal urethral fistula for diverting colostomy by general surgery service, chronic urinary retention with suprapubic tube, history of adenocarcinoma of the prostate, history of bladder cancer Postoperative diagnosis:same with additional diagnosis of bladder calculus  Procedure:cystoscopy via suprapubic tract with exchange of suprapubic catheter   Surgeon: Valetta Fuller M.D.  Anesthesia: Gen.  Indications:Zachary Haas has had a very complicated urologic history over the last 15-20 years. He has a remote history of adenocarcinoma the prostate status post treatment with radiation with external beam plus seed implantation. He also has a history of bladder cancer. For a number of years now he has had a chronic indwelling suprapubic tube. 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 Recently he has had gross feculent material from his penis and is now undergoing diverting colostomy. We are planning on cystoscopy to assess his bladder and change of suprapubic tube.     Technique and findings:patient was brought to the operating room where he had successful induction of general endotracheal anesthesia. He was placed in the supine position and prepped and draped in usual manner. The chronic indwelling suprapubic tube was removed. The patient underwent flexible cystoscopy. Bladder volume was reduced and he did have some chronic erythema and mild mucosal edema. This is more consistent with chronic inflammation I did not see definitive evidence of recurrent malignancy. The patient did have a large 4-5 cm bladder calculus. We did not have access to holmium laser due to the room that the patient was in. I did attempt rigid cystoscopy with the stone crushing instruments but was unable to really get the stone to break up successfully. We felt that the stone was not causing clinical problems at this time it would need to  bring him back perform the definitive management of this stone at a later date after he has recovered from his diverting colostomy. At the completion of the procedure general surgery began their diverting colostomy.

## 2012-08-05 NOTE — Progress Notes (Signed)
Patient ID: Zachary Haas, male   DOB: 04-24-1928, 79 y.o.   MRN: [id number] 1 Day Post-Op  Subjective: Feels well. Minimal pain. Tolerating clear liquids well.  Objective: Vital signs in last 24 hours: Temp:  [97.6 F (36.4 C)-98.7 F (37.1 C)] 98.3 F (36.8 C) (02/04 0521) Pulse Rate:  [63-89] 68  (02/04 0521) Resp:  [10-20] 18  (02/04 0521) BP: (103-145)/(43-87) 108/48 mmHg (02/04 0521) SpO2:  [90 %-100 %] 94 % (02/04 0521) Weight:  [238 lb (107.956 kg)] 238 lb (107.956 kg) (02/03 1715) Last BM Date: 08/04/12  Intake/Output from previous day: 02/03 0701 - 02/04 0700 In: 3605 [P.O.:1080; I.V.:2525] Out: 2280 [Urine:2275; Stool:5] Intake/Output this shift:    General appearance: alert, cooperative and no distress GI: normal findings: soft, non-tender and gas per ostomy. Stoma mucosa appears dark. 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 Incision/Wound: laparoscopic incisions clean and dry  Lab Results:   Basename 08/05/12 0407  WBC 11.5*  HGB 12.6*  HCT 38.1*  PLT 169   BMET  Basename 08/05/12 0407  NA 133*  K 4.3  CL 100  CO2 26  GLUCOSE 134*  BUN 11  CREATININE 0.86  CALCIUM 8.6     Studies/Results: No results found.  Anti-infectives: Anti-infectives     Start     Dose/Rate Route Frequency Ordered Stop   08/04/12 0600   cefOXitin (MEFOXIN) 2 g in dextrose 5 % 50 mL IVPB  Status:  Discontinued        2 g 100 mL/hr over 30 Minutes Intravenous On call to O.R. 08/03/12 1353 08/04/12 1619          Assessment/Plan: s/p Procedure(s): LAPAROSCOPIC DIVERTED COLOSTOMY CYSTOSTOMY SUPRAPUBIC Generally doing well post colostomy. I'm a little concerned about the appearance of his ostomy but difficult to assess due to melanosis. I will change the bag and look at it more closely later today. Physical therapy for mobilization. Advance diet.   LOS: 1 day    Halayna Blane T 08/05/2012

## 2012-08-05 NOTE — Progress Notes (Signed)
Patient ID: Zachary Haas, male   DOB: 07-22-1927, 79 y.o.   MRN: [id number]  Pt comfortable this evening without C/O  I removed the ostomy bag and carefully examined the stoma It is hemorrhagic and purple but with some definite pink color as well There is clearly some ischemia but it may well be viable  Discussed with pt and family.  I think close observation best course for now and we may be able to avoid revision They understand and agree Dr Derrell Lolling has agreed to follow pt while I am on vacation and he is aware of situation

## 2012-08-05 NOTE — Care Management Note (Addendum)
    Page 1 of 2   08/11/2012     10:18:44 AM   CARE MANAGEMENT NOTE 08/11/2012  Patient:  Zachary Haas, Zachary Haas   Account Number:  [id number]  Date Initiated:  08/05/2012  Documentation initiated by:  Lorenda Ishihara  Subjective/Objective Assessment:   79 yo male admitted s/p diverting colostomy. PTA lived at home with spouse.     Action/Plan:   Home when stable   Anticipated DC Date:  08/09/2012   Anticipated DC Plan:  HOME W HOME HEALTH SERVICES      DC Planning Services  CM consult      [facility] Choice  HOME HEALTH   Choice offered to / List presented to:  C-3 Spouse        HH arranged  HH-1 RN  HH-2 PT      HH agency  [facility]   Status of service:  Completed, signed off Medicare Important Message given?   (If response is "NO", the following Medicare IM given date fields will be blank) Date Medicare IM given:   Date Additional Medicare IM given:    Discharge Disposition:  HOME W HOME HEALTH SERVICES  Per UR Regulation:  Reviewed for med. necessity/level of care/duration of stay  If discussed at Long Length of Stay Meetings, dates discussed:    Comments:  08/09/12 Received call from patient's nurse indicating patient to discharge home today with PT/RN services. [facility] had already been arranged. However, start of care needed to be confirmed because patient is a new ostomy. Spoke with wife at bedside who states they have all the supplies that are needed and that she will be okay for start of care for nursing on 08/10/12. Confirmed with [facility] after orders faxed that start of care can be for nursing at least on 08/10/12. Also added PT services to referral as well. Patient's nurse, wife and family aware of dc plan. Raiford Noble, RNCM [phone]   08-05-12 Lorenda Ishihara RN CM [address]  with wife and patient at bedside. New ostomy, will likely need HH RN at d/c. Have used [facility] in the past and would like to use them again. Have all DME needed. Patient has not bed  out of bed yet so may also require PT. Await further orders and continue to follow for d/c needs.

## 2012-08-05 NOTE — Progress Notes (Signed)
Orders received from MD Hoxworth for patient to be NPO Stanford Breed RN 08-05-2013 13:00pm

## 2012-08-06 LAB — CBC
HCT: 37.4 % — ABNORMAL LOW (ref 39.0–52.0)
Hemoglobin: 12.1 g/dL — ABNORMAL LOW (ref 13.0–17.0)
MCHC: 32.4 g/dL (ref 30.0–36.0)
WBC: 8.6 10*3/uL (ref 4.0–10.5)

## 2012-08-06 LAB — BASIC METABOLIC PANEL
BUN: 11 mg/dL (ref 6–23)
CO2: 29 mEq/L (ref 19–32)
Chloride: 103 mEq/L (ref 96–112)
Glucose, Bld: 113 mg/dL — ABNORMAL HIGH (ref 70–99)
Potassium: 4.1 mEq/L (ref 3.5–5.1)

## 2012-08-06 NOTE — Progress Notes (Signed)
2 Days Post-Op  Subjective: Pt con't to do well today.  Pt has been getting up and ambulating and sitting in a chair.  He has passed gas into his bag, but no stool.  Con't to tol a diet.  Objective: Vital signs in last 24 hours: Temp:  [97.6 F (36.4 C)-98.5 F (36.9 C)] 98.5 F (36.9 C) (02/05 0545) Pulse Rate:  [68-86] 86  (02/05 0545) Resp:  [18] 18  (02/05 0545) BP: (112-116)/(50-57) 115/50 mmHg (02/05 0545) SpO2:  [94 %-96 %] 94 % (02/05 0545) Last BM Date: 08/04/12  Intake/Output from previous day: 02/04 0701 - 02/05 0700 In: 1788.8 [I.V.:1788.8] Out: 2285 [Urine:2275; Stool:10] Intake/Output this shift: Total I/O In: 240 [P.O.:240] Out: -   General appearance: alert and cooperative GI: soft, NT,ND, active BS, ostomy congested with a pink base  Lab Results:   [facility] 08/06/12 0420 08/05/12 0407  WBC 8.6 11.5*  HGB 12.1* 12.6*  HCT 37.4* 38.1*  PLT 171 169   BMET  Basename 08/06/12 0420 08/05/12 0407  NA 138 133*  K 4.1 4.3  CL 103 100  CO2 29 26  GLUCOSE 113* 134*  BUN 11 11  CREATININE 0.97 0.86  CALCIUM 8.3* 8.6   PT/INR No results found for this basename: LABPROT:2,INR:2 in the last 72 hours ABG No results found for this basename: PHART:2,PCO2:2,PO2:2,HCO3:2 in the last 72 hours  Studies/Results: No results found.  Anti-infectives: Anti-infectives     Start     Dose/Rate Route Frequency Ordered Stop   08/04/12 0600   cefOXitin (MEFOXIN) 2 g in dextrose 5 % 50 mL IVPB  Status:  Discontinued        2 g 100 mL/hr over 30 Minutes Intravenous On call to O.R. 08/03/12 1353 08/04/12 1619          Assessment/Plan: s/p Procedure(s) (LRB) with comments: LAPAROSCOPIC DIVERTED COLOSTOMY (N/A) - Laparoscopic Colostomy, surgery start time 13:24 CYSTOSTOMY SUPRAPUBIC (N/A) - Cysto via Supra Pubic Tract and Insertion of New Suprapubic Tube, possible Bladder Biopsy, PROCEDURE START 1300, STOP 1314 mobilize Con't to watch his ostomy for  sloughing con't PO Await bowel function.  LOS: 2 days    Marigene Ehlers., [facility] 08/06/2012

## 2012-08-06 NOTE — Progress Notes (Signed)
Dr Maisie Fus paged per answering service due to patient  Stoma site.  Stoma purple in color.  Unable to observe any "pink color" as noted in earlier notes.  Patient does not report any increased pain.  Vital signs 99.4, 96, 111/75/ respirations 20.  Oxygen level 92%2 liter [place].   Discussed above with Physician.  Patient also with congested lung sounds.  Orders to decrease iv fluids to kvo at this time. No labs ordered at this time.  MD's will assess stoma on rounds in am.

## 2012-08-06 NOTE — Progress Notes (Signed)
Physical Therapy Treatment Patient Details Name: Zachary Haas MRN: [id number] DOB: [birth year] Today's Date: 08/06/2012 Time: 4098-1191 PT Time Calculation (min): 28 min  PT Assessment / Plan / Recommendation Comments on Treatment Session  POD # 2 Lap Diverted Colostomy pre medicated for this session.  Progressing well and highly motivated to get better and get home.  Amb on 2 lts O2 as pt having difficulty taking deep breaths due to ABD pain.     Follow Up Recommendations  Home health PT;Supervision - Intermittent     Does the patient have the potential to tolerate intense rehabilitation     Barriers to Discharge        Equipment Recommendations  None recommended by PT    Recommendations for Other Services    Frequency Min 3X/week   Plan Discharge plan remains appropriate    Precautions / Restrictions Precautions Precautions: Fall Precaution Comments: Monitor O2, colostomy Restrictions Weight Bearing Restrictions: No   Pertinent Vitals/Pain C/o 8/10 ABD pain during gait Pre medicated    Mobility  Bed Mobility Bed Mobility: Not assessed Details for Bed Mobility Assistance: Pt OOB in recliner Transfers Transfers: Sit to Stand;Stand to Sit Sit to Stand: 5: Supervision;From chair/3-in-1 Stand to Sit: 5: Supervision;To chair/3-in-1 Details for Transfer Assistance: impulsive, 25% VC's on safety with lines and O2 tubing Ambulation/Gait Ambulation/Gait Assistance: 4: Min guard Ambulation Distance (Feet): 248 Feet Assistive device: Rolling walker Ambulation/Gait Assistance Details: 25% VC's on proper walker to self distance and purse lip breathing as pt tends to be hyper talkative during gait which increases his RR.  Pt having difficulty taking deep breaths 2nd ABD pain from his surgery.  Gait Pattern: Step-through pattern    PT Goals                                           progressing    Visit Information  Last PT Received On: 08/06/12 Assistance Needed: +1     Subjective Data  Subjective: Let's do this Patient Stated Goal: home   Cognition       Balance   good  End of Session PT - End of Session Equipment Utilized During Treatment: Gait belt Activity Tolerance: Patient limited by fatigue;Patient limited by pain Patient left: in chair;with call bell/phone within reach;with family/visitor present   Felecia Shelling  PTA [facility]  Acute  Rehab Pager      [phone]

## 2012-08-07 MED ORDER — LIP MEDEX EX OINT
TOPICAL_OINTMENT | CUTANEOUS | Status: AC
  Administered 2012-08-07: 07:00:00
  Filled 2012-08-07: qty 7

## 2012-08-07 NOTE — Progress Notes (Signed)
3 Days Post-Op  Subjective: Pt doing well no complaints.  Tol PO. Ambulating well.  Objective: Vital signs in last 24 hours: Temp:  [97.8 F (36.6 C)-99.4 F (37.4 C)] 97.8 F (36.6 C) (02/06 0610) Pulse Rate:  [55-96] 82  (02/06 0610) Resp:  [20] 20  (02/06 0610) BP: (111-126)/(62-78) 126/68 mmHg (02/06 0610) SpO2:  [93 %-100 %] 100 % (02/06 0610) Last BM Date: 08/04/12  Intake/Output from previous day: 02/05 0701 - 02/06 0700 In: 2581.8 [P.O.:1200; I.V.:1381.8] Out: 3275 [Urine:3275] Intake/Output this shift:    General appearance: alert and cooperative GI: s/nt/nd active bnowel sounds, ostomy con't to look dark, but functioning  Lab Results:   [facility] 08/06/12 0420 08/05/12 0407  WBC 8.6 11.5*  HGB 12.1* 12.6*  HCT 37.4* 38.1*  PLT 171 169   BMET  Basename 08/06/12 0420 08/05/12 0407  NA 138 133*  K 4.1 4.3  CL 103 100  CO2 29 26  GLUCOSE 113* 134*  BUN 11 11  CREATININE 0.97 0.86  CALCIUM 8.3* 8.6   PT/INR No results found for this basename: LABPROT:2,INR:2 in the last 72 hours ABG No results found for this basename: PHART:2,PCO2:2,PO2:2,HCO3:2 in the last 72 hours  Studies/Results: No results found.  Anti-infectives: Anti-infectives     Start     Dose/Rate Route Frequency Ordered Stop   08/04/12 0600   cefOXitin (MEFOXIN) 2 g in dextrose 5 % 50 mL IVPB  Status:  Discontinued        2 g 100 mL/hr over 30 Minutes Intravenous On call to O.R. 08/03/12 1353 08/04/12 1619          Assessment/Plan: s/p Procedure(s) (LRB) with comments: LAPAROSCOPIC DIVERTED COLOSTOMY (N/A) - Laparoscopic Colostomy, surgery start time 13:24 CYSTOSTOMY SUPRAPUBIC (N/A) - Cysto via Supra Pubic Tract and Insertion of New Suprapubic Tube, possible Bladder Biopsy, PROCEDURE START 1300, STOP 1314 Con't to mobilize Con't to monitor ostomy site, I had Dr. Maisie Fus eval the ostomy with me and she agrees that it is just mucousal sloughing.     LOS: 3 days    Marigene Ehlers.,  [facility] 08/07/2012

## 2012-08-07 NOTE — Progress Notes (Signed)
Physical Therapy Treatment Patient Details Name: Zachary Haas MRN: [id number] DOB: 09-02-1927 Today's Date: 08/07/2012 Time: 4098-1191 PT Time Calculation (min): 27 min  PT Assessment / Plan / Recommendation Comments on Treatment Session  POD # 3 lap diverted Colostomy currently on 3 lts O2.  Assisted pt out of recliner to amb in hallway.  Spouse present and encouraging him. Pt plans to D/C to home.    Follow Up Recommendations  Home health PT     Does the patient have the potential to tolerate intense rehabilitation     Barriers to Discharge        Equipment Recommendations  None recommended by PT    Recommendations for Other Services    Frequency Min 3X/week   Plan Discharge plan remains appropriate    Precautions / Restrictions Precautions Precautions: Fall Precaution Comments: 3 lts O2 and colostomy Restrictions Weight Bearing Restrictions: No   Pertinent Vitals/Pain C/o ABD pain with act    Mobility  Bed Mobility Bed Mobility: Not assessed Details for Bed Mobility Assistance: Pt OOB in recliner Transfers Transfers: Sit to Stand;Stand to Sit Sit to Stand: 5: Supervision;From chair/3-in-1 Stand to Sit: 5: Supervision;To chair/3-in-1 Details for Transfer Assistance: one VC on safety with turns in reguard to IV line Ambulation/Gait Ambulation/Gait Assistance: 4: Min guard Ambulation Distance (Feet): 400 Feet (x 3 standing rest breaks) Assistive device: Rolling walker Ambulation/Gait Assistance Details: 25% VC's to decrease gait speed and perform deep breathing exercises to maintain proper O2 sats.  Pt currnetly on 3 lts nasal with sats avg 88 - 92%. Also encouraged coughing which then pt c/o ABD pain. HR avg 96 during gait. Gait Pattern: Step-through pattern Gait velocity: WFL     PT Goals                                                    progressing    Visit Information  Last PT Received On: 08/07/12    Subjective Data   I feel better   Cognition  good   Balance   good  End of Session PT - End of Session Equipment Utilized During Treatment: Gait belt Activity Tolerance: Patient tolerated treatment well Patient left: in chair;with call bell/phone within reach;with family/visitor present   Felecia Shelling  PTA [facility]  Acute  Rehab Pager      [phone]

## 2012-08-08 NOTE — Progress Notes (Signed)
WOC nurse in with patient, agreed to ambulate after

## 2012-08-08 NOTE — Progress Notes (Signed)
4 Days Post-Op  Subjective: Pt con't to do well.  Ambulating well.  Tol Reg diet.  Ostomy output started overnight.  Objective: Vital signs in last 24 hours: Temp:  [97.9 F (36.6 C)-98.7 F (37.1 C)] 98 F (36.7 C) (02/07 0600) Pulse Rate:  [65-95] 70  (02/07 0600) Resp:  [18-22] 18  (02/07 0600) BP: (110-130)/(65-79) 117/72 mmHg (02/07 0600) SpO2:  [96 %-97 %] 96 % (02/07 0600) Last BM Date: 08/08/12  Intake/Output from previous day: 02/06 0701 - 02/07 0700 In: 1783.7 [P.O.:1340; I.V.:443.7] Out: 3100 [Urine:2800; Stool:300] Intake/Output this shift: Total I/O In: 440 [P.O.:440] Out: -   General appearance: alert and cooperative GI: wound c/d/i, ostomy con't to look dark, but may be melanosis coli, looks pink inside ostomy, functioning  Lab Results:   [facility] 08/06/12 0420  WBC 8.6  HGB 12.1*  HCT 37.4*  PLT 171   BMET  Basename 08/06/12 0420  NA 138  K 4.1  CL 103  CO2 29  GLUCOSE 113*  BUN 11  CREATININE 0.97  CALCIUM 8.3*   PT/INR No results found for this basename: LABPROT:2,INR:2 in the last 72 hours ABG No results found for this basename: PHART:2,PCO2:2,PO2:2,HCO3:2 in the last 72 hours  Studies/Results: No results found.  Anti-infectives: Anti-infectives     Start     Dose/Rate Route Frequency Ordered Stop   08/04/12 0600   cefOXitin (MEFOXIN) 2 g in dextrose 5 % 50 mL IVPB  Status:  Discontinued        2 g 100 mL/hr over 30 Minutes Intravenous On call to O.R. 08/03/12 1353 08/04/12 1619          Assessment/Plan: s/p Procedure(s) (LRB) with comments: LAPAROSCOPIC DIVERTED COLOSTOMY (N/A) - Laparoscopic Colostomy, surgery start time 13:24 CYSTOSTOMY SUPRAPUBIC (N/A) - Cysto via Supra Pubic Tract and Insertion of New Suprapubic Tube, possible Bladder Biopsy, PROCEDURE START 1300, STOP 1314 con't PO Likely home Sat if con't to do well Ostomy education   LOS: 4 days    Marigene Ehlers., [facility] 08/08/2012

## 2012-08-08 NOTE — Progress Notes (Signed)
Patient oob to chair. WOC RN to see patient will ambulate after

## 2012-08-08 NOTE — Progress Notes (Signed)
Pt noted with leakage of brown loose stool around wafer area at the stoma site. Cleansed area and replaced appliance. Will  Continue to monitor per Doctor order and unit protocol. Also noted at the umbilicus site, a blister non raised area. Will continue to monitor patient and follow up with the dayshift nurse.

## 2012-08-09 NOTE — Progress Notes (Signed)
Patient ID: Zachary Haas, male   DOB: Nov 17, 1927, 79 y.o.   MRN: [id number]  General Surgery - [facility] - Progress Note  POD# 5  Subjective: Patient doing well.  Wife at bedside.  Tolerating regular diet.  Stoma functioning - new bag on this morning.  Ambulating with walker.  Objective: Vital signs in last 24 hours: Temp:  [97.9 F (36.6 C)-98.6 F (37 C)] 98.6 F (37 C) (02/08 0755) Pulse Rate:  [74-83] 83 (02/08 0755) Resp:  [16-18] 18 (02/08 0755) BP: (111-131)/(66-75) 114/75 mmHg (02/08 0755) SpO2:  [93 %-97 %] 94 % (02/08 0755) Last BM Date: 08/08/12  Intake/Output from previous day: 02/07 0701 - 02/08 0700 In: 1634.3 [P.O.:1160; I.V.:474.3] Out: 2526 [Urine:2525; Stool:1]  Exam: HEENT - clear, not icteric Neck - soft Chest - clear bilaterally Cor - RRR, no murmur Abd - soft, obese; stoma left mid abdomen with edema, venous congestion; small stool in bag; S-P tube in place Ext - no significant edema Neuro - grossly intact, no focal deficits  Lab Results:  No results found for this basename: WBC, HGB, HCT, PLT,  in the last 72 hours  No results found for this basename: NA, K, CL, CO2, GLUCOSE, BUN, CREATININE, CALCIUM,  in the last 72 hours  Studies/Results: No results found.  Assessment / Plan: 1.  Status sigmoid resection with descending colostomy  Patient and wife desire discharge home today  Home health nurse for stoma care arranged  Home PT arranged  Will arrange follow up in office at [facility] with Dr. Johna Sheriff in 7-10 days  Discharge home  Velora Heckler, MD, [facility] Office: [phone]  08/09/2012

## 2012-08-09 NOTE — Care Management (Signed)
   CARE MANAGEMENT NOTE 08/09/2012  Patient:  Zachary Haas, Zachary Haas   Account Number:  [id number]  Date Initiated:  08/05/2012  Documentation initiated by:  Lorenda Ishihara  Subjective/Objective Assessment:   79 yo male admitted s/p diverting colostomy. PTA lived at home with spouse.     Action/Plan:   Home when stable   Anticipated DC Date:  08/09/2012   Anticipated DC Plan:  HOME W HOME HEALTH SERVICES      DC Planning Services  CM consult      [facility] Choice  HOME HEALTH   Choice offered to / List presented to:  C-3 Spouse        HH arranged  HH-1 RN  HH-2 PT      HH agency  [facility]   Status of service:  Completed, signed off Medicare Important Message given?   (If response is "NO", the following Medicare IM given date fields will be blank) Date Medicare IM given:   Date Additional Medicare IM given:    Discharge Disposition:  HOME W HOME HEALTH SERVICES  Per UR Regulation:  Reviewed for med. necessity/level of care/duration of stay  If discussed at Long Length of Stay Meetings, dates discussed:    Comments:  08/09/12 Received call from patient's nurse indicating patient to discharge home today with PT/RN services. [facility] had already been arranged. However, start of care needed to be confirmed because patient is a new ostomy. Spoke with wife at bedside who states they have all the supplies that are needed and that she will be okay for start of care for nursing on 08/10/12. Confirmed with [facility] after orders faxed that start of care can be for nursing at least on 08/10/12. Also added PT services to referral as well. Patient's nurse, wife and family aware of dc plan. Raiford Noble, RNCM [phone]   08-05-12 Lorenda Ishihara RN CM [address]  with wife and patient at bedside. New ostomy, will likely need HH RN at d/c. Have used [facility] in the past and would like to use them again. Have all DME needed. Patient has not bed out of bed yet so may also require PT. Await  further orders and continue to follow for d/c needs.

## 2012-08-09 NOTE — Consult Note (Addendum)
WOC ostomy consult (late Entry)  Visit made on Friday evening, 08/08/12 Stoma type/location: LUQ Colostomy Stomal assessment/size: 1 and 1/2 inches round, bedded, edematous, dark Peristomal assessment: intact Treatment options for stomal/peristomal skin: none indicated Output brown soft stool Ostomy pouching: 2pc., 2 and 3/4 inches with barrier ring placed from 3-9 o'clock  (half moon) Education provided: Extended and lengthy session with patient, wife and son.  All aspects of ostomy taught, including some covered with patient on Tuesday.  A&P, stoma characteristics, pouch characteristics, emptying, pouch change and activities of daily living.  Patient demonstrated opening and closing of Lock and Roll tail closure on pouch.  Wife observed placement of skin barrier ring (half moon) to act as an additional barrier.  All have patient education booklet and know how to contact me if things are not going well at home.  Patient registered with [facility]. To be followed by [facility]. I will not follow.  Please re-consult if needed. Thanks, Ladona Mow, MSN, RN, [facility], CWOCN [phone])

## 2012-08-14 NOTE — Discharge Summary (Signed)
   Patient ID: Zachary Haas [id number] 79 y.o. 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 He is electively admitted for laparoscopic diverting colostomy.  Hospital Course: on the morning of admission the patient underwent an uneventful laparoscopic sigmoid colostomy. This was an end diverging colostomy. Postoperatively his stoma was noted to be quite congested and borderline ischemic. However with followup it appeared to be likely more venous congestion with underlying healthy tissue. He had no other issues and was able to be advanced to a regular diet and his stoma began functioning well. He is discharged on February 8 tolerating a regular diet, up and around the usual manner with the assistance and stoma is functioning well. Home health has been arranged to assist with ostomy care.  Consults: urology   Disposition: Home  Patient Instructions:    Medication List    TAKE these medications       aspirin 81 MG tablet  Take 81 mg by mouth daily.     buPROPion 100 MG tablet  Commonly known as:  WELLBUTRIN  Take 100 mg by mouth 2 (two) times daily.     citalopram 20 MG tablet  Commonly known as:  CELEXA  Take 20 mg by mouth every evening.     docusate sodium 100 MG capsule  Commonly known as:  COLACE  Take  300 mg by mouth daily.     HYDROcodone-acetaminophen 5-500 MG per tablet  Commonly known as:  VICODIN  Take 1 tablet by mouth every 6 (six) hours as needed. Pain     isosorbide mononitrate 30 MG 24 hr tablet  Commonly known as:  IMDUR  Take 30 mg by mouth daily before breakfast.     metoprolol tartrate 25 MG tablet  Commonly known as:  LOPRESSOR  Take 25 mg by mouth daily before breakfast.     multivitamin with minerals tablet  Take 1 tablet by mouth daily.     psyllium 0.52 G capsule  Commonly known as:  REGULOID  Take 104 g by mouth 2 (two) times daily.     simvastatin 40 MG tablet  Commonly known as:  ZOCOR  Take 40 mg by mouth daily before breakfast.     SM CRANBERRY 300 MG tablet  Generic drug:  Cranberry  Take 300 mg by mouth daily.        Activity: activity as tolerated Diet: regular diet Wound Care: none needed  Follow-up:  With Dr. Johna Sheriff in 2 weeks.  Signed: Mariella Saa MD, FACS  08/14/2012, 9:17 AM

## 2012-08-19 ENCOUNTER — Ambulatory Visit (INDEPENDENT_AMBULATORY_CARE_PROVIDER_SITE_OTHER): Payer: BC Managed Care – HMO | Admitting: General Surgery

## 2012-08-19 ENCOUNTER — Encounter (INDEPENDENT_AMBULATORY_CARE_PROVIDER_SITE_OTHER): Payer: Self-pay | Admitting: General Surgery

## 2012-08-19 VITALS — BP 136/84 | HR 80 | Temp 98.6°F | Resp 18 | Ht 72.0 in | Wt 236.0 lb

## 2012-08-19 DIAGNOSIS — N360 Urethral fistula: Secondary | ICD-10-CM

## 2012-08-19 NOTE — Progress Notes (Signed)
Patient returns following laparoscopic in the diverging sigmoid colostomy due to large rectourethral fistula. He did have some mucosal ischemia of his stoma postop but did well and was discharged after 3-4 days. He reports he is still having some feculent drainage from his penis but this is not unexpected this early on I told him that should clear. His stoma is moving regularly. No double pain. They are having some trouble getting a device to fit due to 2 some retraction of the inferior part of the stoma.  Exam: BP 136/84  Pulse 80  Temp(Src) 98.6 F (37 C)  Resp 18  Ht 6' (1.829 m)  Wt 236 lb (107.049 kg)  BMI 32 kg/m2 General: Appears well Abdomen: Soft and nontender. Her move the stoma bag and he does have some mucosal sloughing but there is more healthy-appearing bowel wall right up to the skin level.  His ostomy device was changed in the office today  Assessment and plan: Status post diverting colostomy. He unfortunately had some mucosal sloughing but the bowel was beginning to pink up and I think will heal. I'm not sure how much retraction or difficulty we will have with a stoma device. Will return in 3 weeks

## 2012-09-11 ENCOUNTER — Ambulatory Visit (INDEPENDENT_AMBULATORY_CARE_PROVIDER_SITE_OTHER): Payer: Medicare Other | Admitting: General Surgery

## 2012-09-11 ENCOUNTER — Encounter (INDEPENDENT_AMBULATORY_CARE_PROVIDER_SITE_OTHER): Payer: Self-pay | Admitting: General Surgery

## 2012-09-11 VITALS — BP 136/68 | HR 84 | Temp 97.6°F | Resp 20 | Ht 68.5 in | Wt 233.0 lb

## 2012-09-11 DIAGNOSIS — N360 Urethral fistula: Secondary | ICD-10-CM

## 2012-09-11 NOTE — Progress Notes (Signed)
History: Patient returns for further followup status post laparoscopic divergent colostomy for large rectourethral fistula post radiation treatment for prostate cancer. He had some superficial ischemia of sloughing of his colostomy early on. He reports it is doing well with normal output and the bag fits well and last a week. Feculent drainage from his urethra has stopped but he still has some tissue and fluid drainage.  Exam: As always he is in good spirits and looks well. Abdomen is soft and nontender. I examined his stoma which now has all healed with pink mucosa. There is some slight stenosis but I was able to dilate this easily with an index finger.  Assessment and plan: Status post diverting colostomy. His colostomy is looking much better. There is some slight stenosis and therefore plan to see him back in a month to reexamine this. I told him he should likely check with Dr. Isabel Caprice regarding the continued drainage and discharge.

## 2012-10-15 ENCOUNTER — Other Ambulatory Visit: Payer: Self-pay | Admitting: Urology

## 2012-10-15 NOTE — Progress Notes (Signed)
Need orders please - DOS 10/22/12 - pt coming for preop 10/20/12 - thank you

## 2012-10-16 ENCOUNTER — Encounter (HOSPITAL_COMMUNITY): Payer: Self-pay | Admitting: Pharmacy Technician

## 2012-10-20 ENCOUNTER — Ambulatory Visit (HOSPITAL_COMMUNITY)
Admission: RE | Admit: 2012-10-20 | Discharge: 2012-10-20 | Disposition: A | Discharge status: Home / Self Care | Payer: Medicare Other | Source: Ambulatory Visit | Attending: Urology | Admitting: Urology

## 2012-10-20 ENCOUNTER — Encounter (HOSPITAL_COMMUNITY): Payer: Self-pay

## 2012-10-20 ENCOUNTER — Encounter (HOSPITAL_COMMUNITY)
Admission: RE | Admit: 2012-10-20 | Discharge: 2012-10-20 | Disposition: A | Discharge status: Home / Self Care | Payer: Medicare Other | Source: Ambulatory Visit | Attending: Urology | Admitting: Urology

## 2012-10-20 DIAGNOSIS — R059 Cough, unspecified: Secondary | ICD-10-CM | POA: Insufficient documentation

## 2012-10-20 DIAGNOSIS — R0989 Other specified symptoms and signs involving the circulatory and respiratory systems: Secondary | ICD-10-CM | POA: Insufficient documentation

## 2012-10-20 DIAGNOSIS — Z01812 Encounter for preprocedural laboratory examination: Secondary | ICD-10-CM | POA: Insufficient documentation

## 2012-10-20 DIAGNOSIS — Z01818 Encounter for other preprocedural examination: Secondary | ICD-10-CM | POA: Insufficient documentation

## 2012-10-20 DIAGNOSIS — N210 Calculus in bladder: Secondary | ICD-10-CM | POA: Insufficient documentation

## 2012-10-20 DIAGNOSIS — R05 Cough: Secondary | ICD-10-CM | POA: Insufficient documentation

## 2012-10-20 DIAGNOSIS — R062 Wheezing: Secondary | ICD-10-CM | POA: Insufficient documentation

## 2012-10-20 DIAGNOSIS — Z951 Presence of aortocoronary bypass graft: Secondary | ICD-10-CM | POA: Insufficient documentation

## 2012-10-20 LAB — BASIC METABOLIC PANEL
BUN: 12 mg/dL (ref 6–23)
CO2: 28 mEq/L (ref 19–32)
Chloride: 101 mEq/L (ref 96–112)
Creatinine, Ser: 0.93 mg/dL (ref 0.50–1.35)

## 2012-10-20 LAB — CBC
HCT: 47.1 % (ref 39.0–52.0)
MCV: 95.7 fL (ref 78.0–100.0)
RBC: 4.92 MIL/uL (ref 4.22–5.81)
WBC: 10 10*3/uL (ref 4.0–10.5)

## 2012-10-20 NOTE — Progress Notes (Signed)
Stress test 2010,  Chest x ray 1/14 EPIC, EKG 2/14 EPIC,  eccho 1/12 EPIC.  States cough more frequent with slightly increased shortness of breath.  Denies any chest pain or cardiac symptoms,  States cough productive at times- white-yellow

## 2012-10-20 NOTE — Patient Instructions (Addendum)
Zachary Haas  10/20/2012   Your procedure is scheduled on:  10/22/12  [facility]  Report to [facility] at    0730   AM.  Call this number if you have problems the morning of surgery: [phone]       Remember:   Do not eat food  Or drink :After Midnight. Tuesday NIGHT   Take these medicines the morning of surgery with A SIP OF WATER:  METOPROLOL, ISOSORBIDE, WELLBUTRIN                                                                                              VICODIN IF NEEDED   .  Contacts, dentures or partial plates can not be worn to surgery  Leave suitcase in the car. After surgery it may be brought to your room.  For patients admitted to the hospital, checkout time is 11:00 AM day of  discharge.             SPECIAL INSTRUCTIONS- SEE [place] PREPARING FOR SURGERY INSTRUCTION SHEET-     DO NOT WEAR JEWELRY, LOTIONS, POWDERS, OR PERFUMES.  WOMEN-- DO NOT SHAVE LEGS OR UNDERARMS FOR 12 HOURS BEFORE SHOWERS. MEN MAY SHAVE FACE.  Patients discharged the day of surgery will not be allowed to drive home. IF going home the day of surgery, you must have a driver and someone to stay with you for the first 24 hours  Name and phone number of your driver:    wife                                                                    Please read over the following fact sheets that you were given: MRSA Information, Incentive Spirometry Sheet, Blood Transfusion Sheet  Information                                                                                   Zachary Haas  PST 336  [id number]                 FAILURE TO FOLLOW THESE INSTRUCTIONS MAY RESULT IN  CANCELLATION   OF YOUR SURGERY                                                  Patient Signature _____________________________

## 2012-10-20 NOTE — Progress Notes (Signed)
10/20/12 1123  OBSTRUCTIVE SLEEP APNEA  Have you ever been diagnosed with sleep apnea through a sleep study? No  Do you snore loudly (loud enough to be heard through closed doors)?  0  Do you often feel tired, fatigued, or sleepy during the daytime? 1  Has anyone observed you stop breathing during your sleep? 0  Do you have, or are you being treated for high blood pressure? 1  BMI more than 35 kg/m2? 1  Age over 77 years old? 1  Neck circumference greater than 40 cm/18 inches? 0  Gender: 1  Obstructive Sleep Apnea Score 5  Score 4 or greater  Results sent to PCP

## 2012-10-21 MED ORDER — GENTAMICIN SULFATE 40 MG/ML IJ SOLN
520.0000 mg | INTRAVENOUS | Status: AC
  Administered 2012-10-22: 520 mg via INTRAVENOUS
  Filled 2012-10-21 (×2): qty 13

## 2012-10-22 ENCOUNTER — Encounter (HOSPITAL_COMMUNITY)
Admission: RE | Disposition: A | Discharge status: Home / Self Care | Payer: Self-pay | Source: Ambulatory Visit | Attending: Urology

## 2012-10-22 ENCOUNTER — Ambulatory Visit (HOSPITAL_COMMUNITY)
Admission: RE | Admit: 2012-10-22 | Discharge: 2012-10-22 | Disposition: A | Discharge status: Home / Self Care | Payer: Medicare Other | Source: Ambulatory Visit | Attending: Urology | Admitting: Urology

## 2012-10-22 ENCOUNTER — Ambulatory Visit (HOSPITAL_COMMUNITY): Payer: Medicare Other | Admitting: Anesthesiology

## 2012-10-22 ENCOUNTER — Encounter (HOSPITAL_COMMUNITY): Payer: Self-pay | Admitting: Anesthesiology

## 2012-10-22 ENCOUNTER — Encounter (HOSPITAL_COMMUNITY): Payer: Self-pay | Admitting: *Deleted

## 2012-10-22 DIAGNOSIS — Y842 Radiological procedure and radiotherapy as the cause of abnormal reaction of the patient, or of later complication, without mention of misadventure at the time of the procedure: Secondary | ICD-10-CM | POA: Insufficient documentation

## 2012-10-22 DIAGNOSIS — I10 Essential (primary) hypertension: Secondary | ICD-10-CM | POA: Insufficient documentation

## 2012-10-22 DIAGNOSIS — N3040 Irradiation cystitis without hematuria: Secondary | ICD-10-CM | POA: Insufficient documentation

## 2012-10-22 DIAGNOSIS — Z8546 Personal history of malignant neoplasm of prostate: Secondary | ICD-10-CM | POA: Insufficient documentation

## 2012-10-22 DIAGNOSIS — T66XXXS Radiation sickness, unspecified, sequela: Secondary | ICD-10-CM | POA: Insufficient documentation

## 2012-10-22 DIAGNOSIS — I2510 Atherosclerotic heart disease of native coronary artery without angina pectoris: Secondary | ICD-10-CM | POA: Insufficient documentation

## 2012-10-22 DIAGNOSIS — I252 Old myocardial infarction: Secondary | ICD-10-CM | POA: Insufficient documentation

## 2012-10-22 DIAGNOSIS — Z79899 Other long term (current) drug therapy: Secondary | ICD-10-CM | POA: Insufficient documentation

## 2012-10-22 DIAGNOSIS — N360 Urethral fistula: Secondary | ICD-10-CM | POA: Insufficient documentation

## 2012-10-22 DIAGNOSIS — I447 Left bundle-branch block, unspecified: Secondary | ICD-10-CM | POA: Insufficient documentation

## 2012-10-22 DIAGNOSIS — N210 Calculus in bladder: Secondary | ICD-10-CM | POA: Insufficient documentation

## 2012-10-22 DIAGNOSIS — I739 Peripheral vascular disease, unspecified: Secondary | ICD-10-CM | POA: Insufficient documentation

## 2012-10-22 DIAGNOSIS — Z8551 Personal history of malignant neoplasm of bladder: Secondary | ICD-10-CM | POA: Insufficient documentation

## 2012-10-22 DIAGNOSIS — E7800 Pure hypercholesterolemia, unspecified: Secondary | ICD-10-CM | POA: Insufficient documentation

## 2012-10-22 DIAGNOSIS — Z7982 Long term (current) use of aspirin: Secondary | ICD-10-CM | POA: Insufficient documentation

## 2012-10-22 DIAGNOSIS — Z923 Personal history of irradiation: Secondary | ICD-10-CM | POA: Insufficient documentation

## 2012-10-22 HISTORY — PX: HOLMIUM LASER APPLICATION: SHX5852

## 2012-10-22 HISTORY — PX: INSERTION OF SUPRAPUBIC CATHETER: SHX5870

## 2012-10-22 HISTORY — PX: CYSTOSCOPY: SHX5120

## 2012-10-22 SURGERY — CYSTOSCOPY
Anesthesia: General | Wound class: Clean Contaminated

## 2012-10-22 MED ORDER — PROPOFOL 10 MG/ML IV BOLUS
INTRAVENOUS | Status: DC | PRN
  Administered 2012-10-22: 120 mg via INTRAVENOUS
  Administered 2012-10-22: 50 mg via INTRAVENOUS

## 2012-10-22 MED ORDER — ALBUTEROL SULFATE (5 MG/ML) 0.5% IN NEBU
2.5000 mg | INHALATION_SOLUTION | Freq: Once | RESPIRATORY_TRACT | Status: AC
  Administered 2012-10-22: 2.5 mg via RESPIRATORY_TRACT

## 2012-10-22 MED ORDER — FENTANYL CITRATE 0.05 MG/ML IJ SOLN
INTRAMUSCULAR | Status: DC | PRN
  Administered 2012-10-22 (×4): 25 ug via INTRAVENOUS

## 2012-10-22 MED ORDER — ALBUTEROL SULFATE (5 MG/ML) 0.5% IN NEBU
INHALATION_SOLUTION | RESPIRATORY_TRACT | Status: AC
  Filled 2012-10-22: qty 0.5

## 2012-10-22 MED ORDER — ONDANSETRON HCL 4 MG/2ML IJ SOLN
INTRAMUSCULAR | Status: DC | PRN
  Administered 2012-10-22: 4 mg via INTRAVENOUS

## 2012-10-22 MED ORDER — BUPIVACAINE HCL (PF) 0.25 % IJ SOLN
INTRAMUSCULAR | Status: AC
  Filled 2012-10-22: qty 30

## 2012-10-22 MED ORDER — FENTANYL CITRATE 0.05 MG/ML IJ SOLN: 25.0000 ug | INTRAMUSCULAR | Status: DC | PRN

## 2012-10-22 MED ORDER — PROMETHAZINE HCL 25 MG/ML IJ SOLN: 6.2500 mg | INTRAMUSCULAR | Status: DC | PRN

## 2012-10-22 MED ORDER — HYDROCODONE-ACETAMINOPHEN 5-325 MG PO TABS: 1.0000 | ORAL_TABLET | Freq: Four times a day (QID) | ORAL | Status: DC | PRN

## 2012-10-22 MED ORDER — DEXAMETHASONE SODIUM PHOSPHATE 10 MG/ML IJ SOLN
INTRAMUSCULAR | Status: DC | PRN
  Administered 2012-10-22: 10 mg via INTRAVENOUS

## 2012-10-22 MED ORDER — EPHEDRINE SULFATE 50 MG/ML IJ SOLN
INTRAMUSCULAR | Status: DC | PRN
  Administered 2012-10-22: 10 mg via INTRAVENOUS

## 2012-10-22 MED ORDER — LACTATED RINGERS IV SOLN
INTRAVENOUS | Status: DC
  Administered 2012-10-22: 1000 mL via INTRAVENOUS

## 2012-10-22 MED ORDER — KETOROLAC TROMETHAMINE 30 MG/ML IJ SOLN: 15.0000 mg | Freq: Once | INTRAMUSCULAR | Status: DC | PRN

## 2012-10-22 MED ORDER — STERILE WATER FOR IRRIGATION IR SOLN
Status: DC | PRN
  Administered 2012-10-22: 3000 mL

## 2012-10-22 MED ORDER — BUPIVACAINE HCL 0.25 % IJ SOLN
INTRAMUSCULAR | Status: DC | PRN
  Administered 2012-10-22: 30 mL

## 2012-10-22 SURGICAL SUPPLY — 36 items
BAG URINE DRAINAGE (UROLOGICAL SUPPLIES) ×2 IMPLANT
BAG URINE LEG 500ML (DRAIN) IMPLANT
BAG URO CATCHER STRL LF (DRAPE) IMPLANT
BASKET ZERO TIP NITINOL 2.4FR (BASKET) ×2 IMPLANT
BLADE SURG 15 STRL LF DISP TIS (BLADE) IMPLANT
BLADE SURG 15 STRL SS (BLADE)
CATH FOLEY LATEX FREE 22FR (CATHETERS) ×1
CATH FOLEY LF 22FR (CATHETERS) ×1 IMPLANT
CATH ROBINSON RED A/P 16FR (CATHETERS) IMPLANT
CATH URET 5FR 28IN OPEN ENDED (CATHETERS) IMPLANT
CLOTH BEACON ORANGE TIMEOUT ST (SAFETY) ×2 IMPLANT
COVER SURGICAL LIGHT HANDLE (MISCELLANEOUS) IMPLANT
DRAPE CAMERA CLOSED 9X96 (DRAPES) ×2 IMPLANT
DRAPE PED LAPAROTOMY (DRAPES) ×2 IMPLANT
ELECT REM PT RETURN 9FT ADLT (ELECTROSURGICAL)
ELECTRODE REM PT RTRN 9FT ADLT (ELECTROSURGICAL) IMPLANT
GLOVE BIOGEL M STRL SZ7.5 (GLOVE) ×2 IMPLANT
GLOVE SURG SS PI 8.0 STRL IVOR (GLOVE) ×2 IMPLANT
GOWN PREVENTION PLUS XLARGE (GOWN DISPOSABLE) IMPLANT
GOWN STRL NON-REIN LRG LVL3 (GOWN DISPOSABLE) IMPLANT
GOWN STRL REIN XL XLG (GOWN DISPOSABLE) ×4 IMPLANT
KIT SUPRAPUBIC CATH (MISCELLANEOUS) IMPLANT
LASER FIBER DISP (UROLOGICAL SUPPLIES) ×2 IMPLANT
MANIFOLD NEPTUNE II (INSTRUMENTS) ×2 IMPLANT
MARKER SKIN DUAL TIP RULER LAB (MISCELLANEOUS) IMPLANT
NEEDLE HYPO 22GX1.5 SAFETY (NEEDLE) IMPLANT
NS IRRIG 1000ML POUR BTL (IV SOLUTION) ×2 IMPLANT
PACK CYSTO (CUSTOM PROCEDURE TRAY) ×2 IMPLANT
PENCIL BUTTON HOLSTER BLD 10FT (ELECTRODE) IMPLANT
PLUG CATH AND CAP STER (CATHETERS) IMPLANT
SET IRRIG Y TYPE TUR BLADDER L (SET/KITS/TRAYS/PACK) ×2 IMPLANT
SUT ETHILON 3 0 FSL (SUTURE) IMPLANT
SYRINGE IRR TOOMEY STRL 70CC (SYRINGE) ×2 IMPLANT
TOWEL OR 17X26 10 PK STRL BLUE (TOWEL DISPOSABLE) ×2 IMPLANT
TUBING CONNECTING 10 (TUBING) IMPLANT
WATER STERILE IRR 3000ML UROMA (IV SOLUTION) IMPLANT

## 2012-10-22 NOTE — H&P (Signed)
Here today for cystoscopy via SP tract for large bladder stone with laser lithotripsy.   History of Present Illness               Zachary Haas returns for followup. In early February 2014 the patient underwent a diverting colostomy by Dr. Zenda Alpers and. We did perform cystoscopy through the suprapubic tract at that time. He had diffusely erythematous and edematous mucosa but I could not clearly see evidence of an obvious bladder malignancy although certainly one could be missed given the appearance of his bladder. He did have a bladder calculus that could not be mx at that time do to unavailability of the holminium laser. He has had a few issues with his ostomy but otherwise was recovered relatively well from that surgery. The fecal material from his suprapubic tube has ceased. He continues to have some drainage from his penis.        Past Gu Hx:       Zachary Haas presents today for follow up.  He had been under the care of physicians at [facility].    [name] has a history of adenocarcinoma of the prostate status post a combination of external beam plus seed implantation(1998) with a fairly stable PSA reading.  The patient also has a history of transitional cell carcinoma.  That was initially diagnosed in March of 2008 and he has substantial involvement in the left hemi-trigone which did require double-J stent placement.  He had questionable superficial invasion with poorly differentiated tumor, but second look procedure showed no evidence of any recurrence/ongoing malignancy.  The patient has had problems with substantial bulbar urethral stricture disease and did have a course of BCG therapy.  When we last saw him, he had a small recurrence at the dome of his bladder in September of 2009, but on his last visit things look unremarkable.  The patient subsequently went to [facility] for a second opinion and has had several evaluations there.  The patient was last seen at [facility] approximately a month  ago.  At that time, the patient had had several course of antibiotic for ongoing cystitis and pyuria.  He was continuing to complain of passage of tissue and passed a couple of old brachytherapy seeds.  At the time of cystoscopy, apparently no evidence of recurrent tumor was noted.  I have reviewed his records from [facility].  The patient is being considered for the possibility of a subclinical fistula between his rectum and urethra/prostatic urethra.  The patient was told that he should have a colonoscopy but no biopsy, which was quite reasonable.  The patient had a discussion at that time about the possible options, including suprapubic tube, ileal conduit, etc, and he was going to discuss those issues.  In the interim, the patient  developed significant problems with voiding and was only able to void small volumes frequently and appeared to be in an overflow type of situation.  Urgent postvoid residual suggested 800 cc plus within his bladder.   [place] has Sp tube placed in 08/2009. His main issue recently, has been an ongoing necrotic process, really within his prostate and prostatic urethra status post a remote history or radiation and seed implantation.  He has had a suprapubic tube and he has actually been changing that himself.  Overall he has been feeling better, has had more strength and energy and has certainly clinically done better in the last several months.  He does continue to have intermittent  release of putrid necrotic material from his penis with this ongoing necrotic process.  I do not feel he would benefit at this point from a urinary diversion, as some of the necrotic material would remain in situ and he certainly is not a candidate for any type of commando operation nor is that really indicated or necessary.  His PSA did increase somewhat but a bone scan was negative.  Again, he harbors diagnoses of both bladder cancer and prostate cancer. He was taken to surgery for a complete endoscopic  reassessment back in August of 2011. Biopsies and cytology's of the urinary tract at that time revealed chronic inflammatory changes, no evidence of malignancy.   The patient was taken back to surgery again in June of 2012.  At that time urine cytology and biopsies failed to show any dysplasia or malignancy.  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    He was recently in to see one of our nurse practitioners with increased gross hematuria. He canceled his last follow up with me at the end of 2012 and I have not seen him for well over a year. He has developed gross hematuria as well as some apparent fecal material in his suprapubic tube. Recent urine culture showed positive growth of 3 uropathogens including pseudomonas. He has been suspected of having at least a small GI GU fistula for a number of years but has not been documented. He has now had 1-2 weeks of gross hematuria and in the last 24 hours he has had air and fecal matter per urethra.   Past Medical History Problems  1. History of  Acute Myocardial Infarction V12.59 2. History of  Cancer 199.1 3. History of  Coagulation Defects 286.9 4. History of  Complicated Replacement Of Cystostomy Tube 5. History of  Cystostomy Tube Replacement 6. History of  Depression 311 7. History of  Hypercholesterolemia 272.0 8. History of  Radiation Therapy V58.0  Surgical History Problems  1. History of  Coronary Artery Four Or More Arterial Bypass Grafts 2. History of  Cystoscopy (Diagnostic) 3. History of  Cystoscopy For Urethral Stricture 4. History of  Cystoscopy With Biopsy 5. History of  Cystoscopy With Biopsy 6. History of  Cystoscopy With Fulguration Minor Lesion (Under 5mm) 7. History of  Simple Replacement Of Cystostomy Tube 8. History of  Simple Replacement Of Cystostomy Tube  Current Meds 1. Aspirin 81 MG Oral Tablet; 1 per day; Therapy: (Recorded:24May2012) to 2.  BuPROPion HCl 100 MG Oral Tablet; 100mg ; 1 tablet twice daily; Therapy: 26Oct2010 to 3. Citalopram Hydrobromide 20 MG Oral Tablet; TAKE 1 TABLET DAILY AS DIRECTED; Therapy:  27Apr2012 to 4. Cranberry TABS; 300mg ; 1 per day; Therapy: (Recorded:24May2012) to 5. Fiber TABS; Therapy: (Recorded:08Jan2014) to 6. Hydrocodone-Acetaminophen 5-500 MG Oral Tablet; TAKE ONE TABLET BY MOUTH EVERY 6  HOURS AS NEEDED; Therapy: 20May2011 to (Evaluate:16Sep2011); Last Rx:19Aug2011 7. Isosorbide Mononitrate ER 30 MG Oral Tablet Extended Release 24 Hour; 30mg  1 per day;  Therapy: 09Mar2011 to 8. Metoprolol Tartrate 25 MG Oral Tablet; 1 per day; Therapy: 28Sep2010 to 9. Multi-Vitamin TABS; 1 per day; Therapy: (Recorded:24May2012) to 10. Simvastatin TABS; 40mg  1 per day; Therapy: (Recorded:24May2012) to 11. Stool Softener CAPS; 1 capsule 3 x daily; Therapy: (Recorded:24May2012) to  Allergies Medication  1. Cipro TABS 2. Ketoconazole CREA 3. FLU (SPLT) 4. Latex Tube/Connector KIT 5. Percocet TABS  Family History Problems  1. Family history of  Family Health Status  Number Of Children 3 sons and 2 daughters 2. Maternal history of  Ischemic Stroke V17.1 3. Paternal history of  Prostate Cancer V16.42 4. Paternal history of  Tuberculosis  Social History Problems  1. Caffeine Use 3 2. Family history of  Death In The Family Father 44 3. Family history of  Death In The Family Mother 20 4. Marital History - Currently Married 5. Occupation: retired 6. History of  Tobacco Use V15.82 1-11/2 ppd for 50 years Denied  7. Alcohol Use  Review of Systems Constitutional, cardiovascular, pulmonary, endocrine and gastrointestinal system(s) were reviewed and pertinent findings if present are noted.  Genitourinary: feelings of urinary urgency, dysuria, nocturia, incontinence, difficulty starting the urinary stream, weak urinary stream, urinary stream starts and stops, incomplete emptying of bladder, post-void  dribbling, hematuria, urethral discharge, foul-smelling urine, cloudy urine, pelvic pain, suprapubic pain, erectile dysfunction, penile pain, scrotal pain, perineal pain and inguinal pain, but no urinary frequency.  Gastrointestinal: abdominal pain, but no constipation.  Constitutional: feeling tired (fatigue), but no fever.  Integumentary: skin rash/lesion.  Hematologic/Lymphatic: a tendency to easily bruise.  Musculoskeletal: back pain, but no joint pain.  Psychiatric: anxiety, depression and emotional problems.    Vitals Vital Signs [Data Includes: Last 1 Day]  25Mar2014 10:11AM  Blood Pressure: 115 / 65 Temperature: 96.9 F Heart Rate: 34  Wd Wn male in NAD resp: nl effort Card: rrr Abd: soft/ nt/ sp site ok Ext: ok  Procedure  Patient's suprapubic tube was changed today in a standard manner.  Amended By: Barron Alvine; 10/01/2012 1:25 PMEST     Assessment Assessed  1. Bladder Calculus 594.1 2. 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 Prostate Cancer 185  Plan Bladder Cancer (188.9)  1. URINE CYTOLOGY  Requested for: 25Mar2014 Prostate Cancer (185)  2. Gabapentin 300 MG Oral Capsule; TAKE 1 CAPSULE 3 TIMES DAILY; Therapy: 25Mar2014 to  (Evaluate:21Oct2014); Last Rx:25Mar2014 Prostate Cancer (185), Chronic Cystitis (595.2), Enterovesical Fistula (596.1)  3. Follow-up Month x 2 Office  Follow-up  Requested for: 25Mar2014  Discussion/Summary   Mr. Zachary Haas situation is quite complicated and we spent considerable time today discussing his situation. The diverting colostomy has certainly helped quite a bit with the fecaluria, but he continues to have quite a bit of bladder spasms along with significant nonspecific perineal and general discomfort. He does continue to have necrotic material per urethra. Again, we tried hyperbaric oxygen, which did seem to help for a while. A lot of his pain seems to be potentially neuropathic in nature. I do want to get him back  on an antispasmodic for his bladder and have provided them with VESIcare 10 mg a day samples. I am also going to start him on generic gabapentin 300 mg 3 times a day to see if we can help with his discomfort. If after a couple of months things are not better, then I do think we need to address potentially going in and taking care of that stone in the bladder, which may be contributing to his discomfort, but it is difficult to know with an certainty. We will attempt to do saline washings of his bladder today for cytologic analysis, although given the chronic inflammation, interpretation may be somewhat difficult.

## 2012-10-22 NOTE — Anesthesia Postprocedure Evaluation (Signed)
  Anesthesia Post-op Note  Patient: Zachary Haas  Procedure(s) Performed: Procedure(s) (LRB): CYSTOSCOPY (N/A) INSERTION OF SUPRAPUBIC CATHETER (N/A) HOLMIUM LASER APPLICATION (N/A)  Patient Location: PACU  Anesthesia Type: General  Level of Consciousness: awake and alert   Airway and Oxygen Therapy: Patient Spontanous Breathing  Post-op Pain: mild  Post-op Assessment: Post-op Vital signs reviewed, Patient's Cardiovascular Status Stable, Respiratory Function Stable, Patent Airway and No signs of Nausea or vomiting  Last Vitals:  Filed Vitals:   10/22/12 1140  BP:   Pulse: 66  Temp:   Resp: 14    Post-op Vital Signs: stable   Complications: No apparent anesthesia complications

## 2012-10-22 NOTE — Transfer of Care (Signed)
Immediate Anesthesia Transfer of Care Note  Patient: Zachary Haas  Procedure(s) Performed: Procedure(s) with comments: CYSTOSCOPY (N/A) - Flexible CYSTOSCOPY VIA SUPRAPUBIC TRACT, HOLMIUM LASER LITHOTRIPSY, NEW SUPRAPUBIC TUBE INSERTION  INSERTION OF SUPRAPUBIC CATHETER (N/A) HOLMIUM LASER APPLICATION (N/A)  Patient Location: PACU  Anesthesia Type:General  Level of Consciousness: awake and patient cooperative  Airway & Oxygen Therapy: Patient Spontanous Breathing and Patient connected to face mask oxygen  Post-op Assessment: Report given to PACU RN and Post -op Vital signs reviewed and stable  Post vital signs: Reviewed and stable  Complications: No apparent anesthesia complications

## 2012-10-22 NOTE — Op Note (Signed)
Preoperative diagnosis: Bladder calculus Postoperative diagnosis: Same  Procedure: Cystoscopy with laser lithotripsy of bladder calculus   Surgeon: Valetta Fuller M.D.  Anesthesia: Gen.  Indications: 79 year old male with a complicated urologic history. 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 Patient also has a prior history of bladder cancer. He has been documented to have a large bladder calculus is now going to be definitively managed.     Technique and findings: Patient was brought the operating room where he had successful induction of general anesthesia. He was kept in the supine position and prepped and draped in usual manner. The patient received perioperative gentamicin and placement of PAS compression boots. Appropriate surgical timeout was performed. The patient's suprapubic tube was removed. Flexible cystoscopy was utilized. A 3-1/2-4 cm stone was located in the bladder. Holmium laser lithotripter fiber was used to break the stone into innumerable fragments which were flushed from the bladder utilizing a rigid scope and irrigation syringe. At completion of the procedure no remaining fragments were noted. There was no evidence of any obvious recurrent bladder tumor. There were multiple areas of mucosal edema and erythema consistent with chronic inflammation but again nothing that appeared malignant. The patient tolerated the procedure well and had no obvious complications. He was brought to recovery room in stable condition.

## 2012-10-22 NOTE — Anesthesia Preprocedure Evaluation (Addendum)
Anesthesia Evaluation  Patient identified by MRN, date of birth, ID band Patient awake    Reviewed: Allergy & Precautions, H&P , NPO status , Patient's Chart, lab work & pertinent test results  Airway Mallampati: II TM Distance: <3 FB Neck ROM: Full    Dental no notable dental hx.    Pulmonary neg pulmonary ROS,  breath sounds clear to auscultation  Pulmonary exam normal       Cardiovascular hypertension, Pt. on medications + CAD, + Past MI, + CABG and + Peripheral Vascular Disease Rhythm:Regular Rate:Normal  LBBB   Neuro/Psych negative neurological ROS  negative psych ROS   GI/Hepatic negative GI ROS, Neg liver ROS,   Endo/Other  negative endocrine ROS  Renal/GU negative Renal ROS  negative genitourinary   Musculoskeletal negative musculoskeletal ROS (+)   Abdominal   Peds negative pediatric ROS (+)  Hematology negative hematology ROS (+)   Anesthesia Other Findings   Reproductive/Obstetrics negative OB ROS                          Anesthesia Physical Anesthesia Plan  ASA: III  Anesthesia Plan: General   Post-op Pain Management:    Induction: Intravenous  Airway Management Planned: LMA  Additional Equipment:   Intra-op Plan:   Post-operative Plan:   Informed Consent: I have reviewed the patients History and Physical, chart, labs and discussed the procedure including the risks, benefits and alternatives for the proposed anesthesia with the patient or authorized representative who has indicated his/her understanding and acceptance.   Dental advisory given  Plan Discussed with: CRNA and Surgeon  Anesthesia Plan Comments:         Anesthesia Quick Evaluation

## 2012-10-22 NOTE — Preoperative (Signed)
Beta Blockers   Reason not to administer Beta Blockers:Not Applicable 

## 2012-10-23 ENCOUNTER — Encounter (HOSPITAL_COMMUNITY): Payer: Self-pay | Admitting: Urology

## 2012-10-24 ENCOUNTER — Encounter (INDEPENDENT_AMBULATORY_CARE_PROVIDER_SITE_OTHER): Payer: Medicare Other | Admitting: General Surgery

## 2012-10-28 ENCOUNTER — Telehealth (INDEPENDENT_AMBULATORY_CARE_PROVIDER_SITE_OTHER): Payer: Self-pay | Admitting: General Surgery

## 2012-10-28 ENCOUNTER — Telehealth (INDEPENDENT_AMBULATORY_CARE_PROVIDER_SITE_OTHER): Payer: Self-pay

## 2012-10-28 NOTE — Telephone Encounter (Signed)
Patient's wife calling to get a sooner appt with Dr Johna Sheriff- she states his stoma is slowly closing since last week. It is now the size of a pencil. Stool is still able to pass through. Please advise. [phone].

## 2012-10-28 NOTE — Telephone Encounter (Signed)
Called patient with a follow up appointment scheduled for Friday 10/31/12 @ 4:30 with Dr. Johna Sheriff to evaluate stoma.

## 2012-10-31 ENCOUNTER — Encounter (INDEPENDENT_AMBULATORY_CARE_PROVIDER_SITE_OTHER): Payer: Self-pay | Admitting: General Surgery

## 2012-10-31 ENCOUNTER — Ambulatory Visit (INDEPENDENT_AMBULATORY_CARE_PROVIDER_SITE_OTHER): Payer: Medicare Other | Admitting: General Surgery

## 2012-10-31 VITALS — BP 118/76 | HR 82 | Temp 97.3°F | Resp 20 | Ht 68.5 in | Wt 228.6 lb

## 2012-10-31 DIAGNOSIS — N360 Urethral fistula: Secondary | ICD-10-CM

## 2012-10-31 NOTE — Progress Notes (Signed)
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 He and his wife have noted some narrowing of the stoma with ribbonlike stools and difficulty keeping a fitting device in place. He did have some partial necrosis and retraction of the stoma which we have been following. He has no symptoms of obstruction. His feculent penile drainage has stopped.  Exam: BP 118/76  Pulse 82  Temp(Src) 97.3 F (36.3 C) (Temporal)  Resp 20  Ht 5' 8.5" (1.74 m)  Wt 228 lb 9.6 oz (103.692 kg)  BMI 34.25 kg/m2 General: Appears well Abdomen: Soft and nondistended and nontender. His stoma shows a healthy mucosa over about two thirds but in the left lower quadrant there is cicatrix and skin scarring over this with some narrowing. I was able to dilate this digitally and there does not appear to be any significant obstruction.  Assessment and plan: Some narrowing and retraction of his ostomy secondary to sloughing which has resulted in difficulty with his appliance. I do not believe there is any impending obstruction. We're going to have him see the enterostomal therapist as an outpatient to look at some options for devices. I was able to dilate him some today. I'm going to see him back in one month. I told him and his wife that if these issues cannot be managed conservatively that we would consider revision of his ostomy.

## 2012-11-05 ENCOUNTER — Encounter (INDEPENDENT_AMBULATORY_CARE_PROVIDER_SITE_OTHER): Payer: Self-pay

## 2012-11-25 ENCOUNTER — Inpatient Hospital Stay: Admit: 2012-11-25 | Payer: Self-pay | Admitting: Internal Medicine

## 2012-11-25 ENCOUNTER — Inpatient Hospital Stay (HOSPITAL_COMMUNITY)
Admission: EM | Admit: 2012-11-25 | Discharge: 2012-12-01 | DRG: 871 | Disposition: A | Discharge status: Home Health | Payer: Medicare Other | Attending: Internal Medicine | Admitting: Internal Medicine

## 2012-11-25 ENCOUNTER — Encounter (HOSPITAL_COMMUNITY): Payer: Self-pay | Admitting: Emergency Medicine

## 2012-11-25 ENCOUNTER — Emergency Department (HOSPITAL_COMMUNITY): Payer: Medicare Other

## 2012-11-25 DIAGNOSIS — Z7982 Long term (current) use of aspirin: Secondary | ICD-10-CM

## 2012-11-25 DIAGNOSIS — J449 Chronic obstructive pulmonary disease, unspecified: Secondary | ICD-10-CM | POA: Diagnosis present

## 2012-11-25 DIAGNOSIS — Z933 Colostomy status: Secondary | ICD-10-CM

## 2012-11-25 DIAGNOSIS — Z6833 Body mass index (BMI) 33.0-33.9, adult: Secondary | ICD-10-CM

## 2012-11-25 DIAGNOSIS — Z9849 Cataract extraction status, unspecified eye: Secondary | ICD-10-CM

## 2012-11-25 DIAGNOSIS — E7800 Pure hypercholesterolemia, unspecified: Secondary | ICD-10-CM | POA: Diagnosis present

## 2012-11-25 DIAGNOSIS — Z91040 Latex allergy status: Secondary | ICD-10-CM

## 2012-11-25 DIAGNOSIS — Z8042 Family history of malignant neoplasm of prostate: Secondary | ICD-10-CM

## 2012-11-25 DIAGNOSIS — C801 Malignant (primary) neoplasm, unspecified: Secondary | ICD-10-CM | POA: Insufficient documentation

## 2012-11-25 DIAGNOSIS — J9600 Acute respiratory failure, unspecified whether with hypoxia or hypercapnia: Secondary | ICD-10-CM | POA: Diagnosis present

## 2012-11-25 DIAGNOSIS — J8410 Pulmonary fibrosis, unspecified: Secondary | ICD-10-CM | POA: Diagnosis present

## 2012-11-25 DIAGNOSIS — I5031 Acute diastolic (congestive) heart failure: Secondary | ICD-10-CM | POA: Diagnosis present

## 2012-11-25 DIAGNOSIS — K219 Gastro-esophageal reflux disease without esophagitis: Secondary | ICD-10-CM | POA: Diagnosis present

## 2012-11-25 DIAGNOSIS — R0603 Acute respiratory distress: Secondary | ICD-10-CM

## 2012-11-25 DIAGNOSIS — A419 Sepsis, unspecified organism: Principal | ICD-10-CM | POA: Diagnosis present

## 2012-11-25 DIAGNOSIS — I452 Bifascicular block: Secondary | ICD-10-CM | POA: Diagnosis present

## 2012-11-25 DIAGNOSIS — N342 Other urethritis: Secondary | ICD-10-CM | POA: Diagnosis present

## 2012-11-25 DIAGNOSIS — Z951 Presence of aortocoronary bypass graft: Secondary | ICD-10-CM

## 2012-11-25 DIAGNOSIS — I10 Essential (primary) hypertension: Secondary | ICD-10-CM | POA: Diagnosis present

## 2012-11-25 DIAGNOSIS — N360 Urethral fistula: Secondary | ICD-10-CM | POA: Diagnosis present

## 2012-11-25 DIAGNOSIS — E669 Obesity, unspecified: Secondary | ICD-10-CM | POA: Diagnosis present

## 2012-11-25 DIAGNOSIS — F329 Major depressive disorder, single episode, unspecified: Secondary | ICD-10-CM | POA: Diagnosis present

## 2012-11-25 DIAGNOSIS — F3289 Other specified depressive episodes: Secondary | ICD-10-CM | POA: Diagnosis present

## 2012-11-25 DIAGNOSIS — R0902 Hypoxemia: Secondary | ICD-10-CM | POA: Diagnosis present

## 2012-11-25 DIAGNOSIS — J189 Pneumonia, unspecified organism: Secondary | ICD-10-CM

## 2012-11-25 DIAGNOSIS — C61 Malignant neoplasm of prostate: Secondary | ICD-10-CM

## 2012-11-25 DIAGNOSIS — Z8546 Personal history of malignant neoplasm of prostate: Secondary | ICD-10-CM

## 2012-11-25 DIAGNOSIS — Z7901 Long term (current) use of anticoagulants: Secondary | ICD-10-CM

## 2012-11-25 DIAGNOSIS — Z887 Allergy status to serum and vaccine status: Secondary | ICD-10-CM

## 2012-11-25 DIAGNOSIS — Z823 Family history of stroke: Secondary | ICD-10-CM

## 2012-11-25 DIAGNOSIS — E876 Hypokalemia: Secondary | ICD-10-CM | POA: Diagnosis not present

## 2012-11-25 DIAGNOSIS — Z87891 Personal history of nicotine dependence: Secondary | ICD-10-CM

## 2012-11-25 DIAGNOSIS — I219 Acute myocardial infarction, unspecified: Secondary | ICD-10-CM | POA: Diagnosis present

## 2012-11-25 DIAGNOSIS — I4892 Unspecified atrial flutter: Secondary | ICD-10-CM | POA: Diagnosis not present

## 2012-11-25 DIAGNOSIS — I509 Heart failure, unspecified: Secondary | ICD-10-CM | POA: Diagnosis present

## 2012-11-25 DIAGNOSIS — Z8679 Personal history of other diseases of the circulatory system: Secondary | ICD-10-CM

## 2012-11-25 DIAGNOSIS — J441 Chronic obstructive pulmonary disease with (acute) exacerbation: Secondary | ICD-10-CM | POA: Diagnosis present

## 2012-11-25 DIAGNOSIS — Z881 Allergy status to other antibiotic agents status: Secondary | ICD-10-CM

## 2012-11-25 DIAGNOSIS — Z8551 Personal history of malignant neoplasm of bladder: Secondary | ICD-10-CM

## 2012-11-25 DIAGNOSIS — I4891 Unspecified atrial fibrillation: Secondary | ICD-10-CM

## 2012-11-25 DIAGNOSIS — I252 Old myocardial infarction: Secondary | ICD-10-CM

## 2012-11-25 DIAGNOSIS — Z923 Personal history of irradiation: Secondary | ICD-10-CM

## 2012-11-25 DIAGNOSIS — Z79899 Other long term (current) drug therapy: Secondary | ICD-10-CM

## 2012-11-25 DIAGNOSIS — I719 Aortic aneurysm of unspecified site, without rupture: Secondary | ICD-10-CM | POA: Diagnosis present

## 2012-11-25 DIAGNOSIS — C679 Malignant neoplasm of bladder, unspecified: Secondary | ICD-10-CM | POA: Diagnosis present

## 2012-11-25 DIAGNOSIS — I2510 Atherosclerotic heart disease of native coronary artery without angina pectoris: Secondary | ICD-10-CM | POA: Diagnosis present

## 2012-11-25 DIAGNOSIS — I5032 Chronic diastolic (congestive) heart failure: Secondary | ICD-10-CM | POA: Diagnosis present

## 2012-11-25 DIAGNOSIS — I4819 Other persistent atrial fibrillation: Secondary | ICD-10-CM | POA: Diagnosis present

## 2012-11-25 HISTORY — DX: Malignant neoplasm of prostate: C61

## 2012-11-25 HISTORY — DX: Malignant neoplasm of bladder, unspecified: C67.9

## 2012-11-25 LAB — URINALYSIS, ROUTINE W REFLEX MICROSCOPIC
Glucose, UA: NEGATIVE mg/dL
Ketones, ur: NEGATIVE mg/dL
Nitrite: NEGATIVE
Protein, ur: 100 mg/dL — AB
Specific Gravity, Urine: 1.024 (ref 1.005–1.030)
Urobilinogen, UA: 1 mg/dL (ref 0.0–1.0)
pH: 5.5 (ref 5.0–8.0)

## 2012-11-25 LAB — PRO B NATRIURETIC PEPTIDE: Pro B Natriuretic peptide (BNP): 2648 pg/mL — ABNORMAL HIGH (ref 0–450)

## 2012-11-25 LAB — POCT I-STAT 3, ART BLOOD GAS (G3+)
Acid-base deficit: 3 mmol/L — ABNORMAL HIGH (ref 0.0–2.0)
Bicarbonate: 21.3 meq/L (ref 20.0–24.0)
O2 Saturation: 99 %
Patient temperature: 102.3
TCO2: 22 mmol/L (ref 0–100)
pCO2 arterial: 37.5 mmHg (ref 35.0–45.0)
pH, Arterial: 7.372 (ref 7.350–7.450)
pO2, Arterial: 138 mmHg — ABNORMAL HIGH (ref 80.0–100.0)

## 2012-11-25 LAB — COMPREHENSIVE METABOLIC PANEL WITH GFR
ALT: 35 U/L (ref 0–53)
Alkaline Phosphatase: 79 U/L (ref 39–117)
BUN: 20 mg/dL (ref 6–23)
CO2: 19 meq/L (ref 19–32)
Chloride: 99 meq/L (ref 96–112)
GFR calc Af Amer: 89 mL/min — ABNORMAL LOW (ref >90)
GFR calc non Af Amer: 77 mL/min — ABNORMAL LOW (ref >90)
Glucose, Bld: 137 mg/dL — ABNORMAL HIGH (ref 70–99)
Potassium: 3.8 meq/L (ref 3.5–5.1)
Sodium: 134 meq/L — ABNORMAL LOW (ref 135–145)
Total Bilirubin: 1.2 mg/dL (ref 0.3–1.2)
Total Protein: 7.4 g/dL (ref 6.0–8.3)

## 2012-11-25 LAB — CBC WITH DIFFERENTIAL/PLATELET
Basophils Absolute: 0 10*3/uL (ref 0.0–0.1)
Basophils Relative: 0 % (ref 0–1)
Eosinophils Absolute: 0 K/uL (ref 0.0–0.7)
Eosinophils Relative: 0 % (ref 0–5)
HCT: 45.1 % (ref 39.0–52.0)
Hemoglobin: 15.2 g/dL (ref 13.0–17.0)
Lymphocytes Relative: 10 % — ABNORMAL LOW (ref 12–46)
Lymphs Abs: 1.6 10*3/uL (ref 0.7–4.0)
MCH: 31.3 pg (ref 26.0–34.0)
MCHC: 33.7 g/dL (ref 30.0–36.0)
MCV: 93 fL (ref 78.0–100.0)
Monocytes Absolute: 1.5 10*3/uL — ABNORMAL HIGH (ref 0.1–1.0)
Monocytes Relative: 9 % (ref 3–12)
Neutro Abs: 13.1 K/uL — ABNORMAL HIGH (ref 1.7–7.7)
Neutrophils Relative %: 81 % — ABNORMAL HIGH (ref 43–77)
Platelets: 176 10*3/uL (ref 150–400)
RBC: 4.85 MIL/uL (ref 4.22–5.81)
RDW: 14.2 % (ref 11.5–15.5)
WBC: 16.2 10*3/uL — ABNORMAL HIGH (ref 4.0–10.5)

## 2012-11-25 LAB — URINE MICROSCOPIC-ADD ON

## 2012-11-25 LAB — EXPECTORATED SPUTUM ASSESSMENT W GRAM STAIN, RFLX TO RESP C: Special Requests: NORMAL

## 2012-11-25 LAB — COMPREHENSIVE METABOLIC PANEL
AST: 48 U/L — ABNORMAL HIGH (ref 0–37)
Albumin: 2.5 g/dL — ABNORMAL LOW (ref 3.5–5.2)
Calcium: 8.8 mg/dL (ref 8.4–10.5)
Creatinine, Ser: 0.91 mg/dL (ref 0.50–1.35)

## 2012-11-25 LAB — MRSA PCR SCREENING: MRSA by PCR: NEGATIVE

## 2012-11-25 LAB — PROTIME-INR
INR: 1.15 (ref 0.00–1.49)
Prothrombin Time: 14.5 seconds (ref 11.6–15.2)

## 2012-11-25 LAB — CG4 I-STAT (LACTIC ACID): Lactic Acid, Venous: 2.74 mmol/L — ABNORMAL HIGH (ref 0.5–2.2)

## 2012-11-25 LAB — TROPONIN I: Troponin I: <0.3 ng/mL (ref <0.30)

## 2012-11-25 LAB — PROCALCITONIN: Procalcitonin: 0.1 ng/mL

## 2012-11-25 LAB — APTT: aPTT: 29 s (ref 24–37)

## 2012-11-25 MED ORDER — SODIUM CHLORIDE 0.9 % IV SOLN: 1000.0000 mL | INTRAVENOUS | Status: DC

## 2012-11-25 MED ORDER — LEVALBUTEROL HCL 1.25 MG/0.5ML IN NEBU
1.2500 mg | INHALATION_SOLUTION | Freq: Once | RESPIRATORY_TRACT | Status: AC
  Administered 2012-11-25: 1.25 mg via RESPIRATORY_TRACT
  Filled 2012-11-25: qty 0.5

## 2012-11-25 MED ORDER — ENOXAPARIN SODIUM 40 MG/0.4ML ~~LOC~~ SOLN
40.0000 mg | SUBCUTANEOUS | Status: DC
  Administered 2012-11-25: 40 mg via SUBCUTANEOUS
  Filled 2012-11-25 (×2): qty 0.4

## 2012-11-25 MED ORDER — ALBUTEROL SULFATE (5 MG/ML) 0.5% IN NEBU: 5.0000 mg | INHALATION_SOLUTION | Freq: Once | RESPIRATORY_TRACT | Status: DC

## 2012-11-25 MED ORDER — DEXTROSE 5 % IV SOLN
5.0000 mg/h | INTRAVENOUS | Status: DC
  Administered 2012-11-26: 7.5 mg/h via INTRAVENOUS
  Filled 2012-11-25: qty 100

## 2012-11-25 MED ORDER — ASPIRIN EC 81 MG PO TBEC
81.0000 mg | DELAYED_RELEASE_TABLET | Freq: Every day | ORAL | Status: DC
  Administered 2012-11-26 – 2012-12-01 (×6): 81 mg via ORAL
  Filled 2012-11-25 (×6): qty 1

## 2012-11-25 MED ORDER — ONDANSETRON HCL 4 MG PO TABS: 4.0000 mg | ORAL_TABLET | Freq: Four times a day (QID) | ORAL | Status: DC | PRN

## 2012-11-25 MED ORDER — VANCOMYCIN HCL IN DEXTROSE 1-5 GM/200ML-% IV SOLN
1000.0000 mg | Freq: Once | INTRAVENOUS | Status: AC
  Administered 2012-11-25: 1000 mg via INTRAVENOUS
  Filled 2012-11-25: qty 200

## 2012-11-25 MED ORDER — SODIUM CHLORIDE 0.9 % IV SOLN
1000.0000 mL | INTRAVENOUS | Status: DC
  Administered 2012-11-25: 1000 mL via INTRAVENOUS

## 2012-11-25 MED ORDER — FUROSEMIDE 10 MG/ML IJ SOLN
40.0000 mg | Freq: Once | INTRAMUSCULAR | Status: AC
  Administered 2012-11-25: 40 mg via INTRAVENOUS
  Filled 2012-11-25: qty 4

## 2012-11-25 MED ORDER — BUPROPION HCL 100 MG PO TABS
100.0000 mg | ORAL_TABLET | Freq: Two times a day (BID) | ORAL | Status: DC
  Administered 2012-11-25 – 2012-12-01 (×12): 100 mg via ORAL
  Filled 2012-11-25 (×13): qty 1

## 2012-11-25 MED ORDER — SIMVASTATIN 40 MG PO TABS: 40.0000 mg | ORAL_TABLET | Freq: Every day | ORAL | Status: DC

## 2012-11-25 MED ORDER — DEXTROSE 5 % IV SOLN
1.0000 g | Freq: Three times a day (TID) | INTRAVENOUS | Status: DC
  Administered 2012-11-25 – 2012-11-27 (×7): 1 g via INTRAVENOUS
  Filled 2012-11-25 (×10): qty 1

## 2012-11-25 MED ORDER — DEXTROSE 5 % IV SOLN
5.0000 mg/h | Freq: Once | INTRAVENOUS | Status: AC
  Administered 2012-11-25: 5 mg/h via INTRAVENOUS

## 2012-11-25 MED ORDER — ONDANSETRON HCL 4 MG/2ML IJ SOLN: 4.0000 mg | Freq: Four times a day (QID) | INTRAMUSCULAR | Status: DC | PRN

## 2012-11-25 MED ORDER — CITALOPRAM HYDROBROMIDE 20 MG PO TABS
20.0000 mg | ORAL_TABLET | Freq: Every evening | ORAL | Status: DC
  Administered 2012-11-25 – 2012-11-30 (×6): 20 mg via ORAL
  Filled 2012-11-25 (×7): qty 1

## 2012-11-25 MED ORDER — VANCOMYCIN HCL IN DEXTROSE 750-5 MG/150ML-% IV SOLN
750.0000 mg | Freq: Two times a day (BID) | INTRAVENOUS | Status: DC
  Administered 2012-11-26 – 2012-11-28 (×5): 750 mg via INTRAVENOUS
  Filled 2012-11-25 (×7): qty 150

## 2012-11-25 MED ORDER — SODIUM CHLORIDE 0.9 % IJ SOLN
3.0000 mL | Freq: Two times a day (BID) | INTRAMUSCULAR | Status: DC
  Administered 2012-11-25 – 2012-11-30 (×11): 3 mL via INTRAVENOUS

## 2012-11-25 MED ORDER — DEXTROSE 5 % IV SOLN
2.0000 g | Freq: Once | INTRAVENOUS | Status: AC
  Administered 2012-11-25: 2 g via INTRAVENOUS
  Filled 2012-11-25: qty 2

## 2012-11-25 MED ORDER — ATORVASTATIN CALCIUM 20 MG PO TABS
20.0000 mg | ORAL_TABLET | Freq: Every day | ORAL | Status: DC
  Administered 2012-11-26 – 2012-12-01 (×6): 20 mg via ORAL
  Filled 2012-11-25 (×6): qty 1

## 2012-11-25 MED ORDER — ASPIRIN 81 MG PO TABS: 81.0000 mg | ORAL_TABLET | Freq: Every day | ORAL | Status: DC

## 2012-11-25 MED ORDER — ACETAMINOPHEN 325 MG PO TABS
650.0000 mg | ORAL_TABLET | Freq: Once | ORAL | Status: AC
  Administered 2012-11-25: 650 mg via ORAL
  Filled 2012-11-25: qty 2

## 2012-11-25 MED ORDER — SODIUM CHLORIDE 0.9 % IV SOLN
1000.0000 mL | Freq: Once | INTRAVENOUS | Status: AC
  Administered 2012-11-25: 1000 mL via INTRAVENOUS

## 2012-11-25 MED ORDER — METOPROLOL TARTRATE 25 MG PO TABS
25.0000 mg | ORAL_TABLET | Freq: Three times a day (TID) | ORAL | Status: DC
  Administered 2012-11-26 – 2012-12-01 (×16): 25 mg via ORAL
  Filled 2012-11-25 (×20): qty 1

## 2012-11-25 NOTE — ED Notes (Signed)
Portable chest xray at bedside.

## 2012-11-25 NOTE — Progress Notes (Signed)
Pt arrived on unit in respiratory distress (increased work of breathing, RR high 20's).  Pt on venturi mask with oxygen saturations 88-92%, Cardizem drip 7.41mcg, denied pain but admits to being SOB.  MD at bedside and requested pt to be moved to a "camera" room.  Charge nurse will follow up.

## 2012-11-25 NOTE — Progress Notes (Signed)
ANTIBIOTIC CONSULT NOTE - INITIAL  Pharmacy Consult for Vancomycin/Cefepime Indication: rule out pneumonia, Sepsis  Allergies  Allergen Reactions  . Percocet (Oxycodone-Acetaminophen) Anaphylaxis and Shortness Of Breath  . Ciprofloxacin Tinitus  . Influenza Vac Split (Flu Virus Vaccine) Other (See Comments)    Gets the flu  . Tape Other (See Comments)    Blisters skin---------------------USE PAPER TAPE  . Latex Rash    Patient Measurements:   Ht: 68 in Wt: 103.7 kg  Vital Signs: Temp: 102.3 F (39.1 C) (05/27 1455) Temp src: Rectal (05/27 1455) BP: 126/77 mmHg (05/27 1500) Pulse Rate: 163 (05/27 1455) Intake/Output from previous day:   Intake/Output from this shift:    Labs: No results found for this basename: WBC, HGB, PLT, LABCREA, CREATININE,  in the last 72 hours The CrCl is unknown because both a height and weight (above a minimum accepted value) are required for this calculation. No results found for this basename: VANCOTROUGH, VANCOPEAK, VANCORANDOM, GENTTROUGH, GENTPEAK, GENTRANDOM, TOBRATROUGH, TOBRAPEAK, TOBRARND, AMIKACINPEAK, AMIKACINTROU, AMIKACIN,  in the last 72 hours   Microbiology: No results found for this or any previous visit (from the past 720 hour(s)).  Medical History: Past Medical History  Diagnosis Date  . Personal history of other diseases of circulatory system   . Other malignant neoplasm without specification of site   . Other and unspecified coagulation defects   . Depressive disorder, not elsewhere classified   . Pure hypercholesterolemia   . Radiotherapy   . Rectal fistula   . Personal history of tobacco use, presenting hazards to health     1-1 1/2 ppd for 50 years  . Malignant neoplasm of bladder, part unspecified   . Other chronic cystitis   . Intestinovesical fistula   . Gross hematuria   . Malignant neoplasm of prostate   . Myocardial infarction 1992, 1998  . Aortic aneurysm 2008  . Hypertension   . Tendinitis   .  Breathing problem     shallow/ chronic productive cough  . Bronchitis   . GERD (gastroesophageal reflux disease)   . Headache     sinus  . Coronary artery disease   . Left bundle branch block    Assessment: 79 y/o from [place] office per EMS for respiratory distress and marked tachycardia. To start vancomycin and cefepime per pharmacy for possible PNA. o2 stat 91-94% on [place], Tmax 102.3, CXR reveals interstitial opacities, WBC is 16.2, renal function is ok with Scr 0.91. CODE SEPSIS called and first dose vancomycin and zosyn pulled and given to RN.   ED Antibiotics Vancomycin 1000mg  IV x 1 Cefepime 2g IV x 1  Goal of Therapy:  Vancomycin trough level 15-20 mcg/ml  Plan:  -Start Vancomycin 750 mg IV q12h at 0200 on 5/28 -Start Cefepime 1g IV q8h at 2200 today -Trend WBC, temp, renal function -Drug levels when indicated  Abran Duke, PharmD Clinical Pharmacist Phone: [phone] Pager: [phone] 11/25/2012 3:19 PM

## 2012-11-25 NOTE — ED Notes (Signed)
MD at bedside. 

## 2012-11-25 NOTE — ED Notes (Signed)
Dr. Ghim at bedside. 

## 2012-11-25 NOTE — ED Notes (Addendum)
Per EMS pt from [facility] physician called out for respiratory distress pt lips cyanotic- pt 91%/3L and tachycardic pt places on NRB enroute found to be in SVT HR in 170's given 6mg  of adenosine with no change. HR appears to be a.fib not SVT. Pt alert and oriented. Productive cough- grey sputum.

## 2012-11-25 NOTE — H&P (Signed)
[facility] History and Physical  Zachary Haas ZOX:[id number] DOB: 1928-02-23 DOA: 11/25/2012  Referring physician: ED PCP: Pearla Dubonnet, MD  Specialists: [facility] Cardiology   Chief Complaint:  Chief Complaint  Patient presents with  . Code Sepsis     HPI: Zachary Haas is a 79 y.o. male with past medical history of coronary disease, coronary artery bypass grafting, COPD, and prostate cancer, bladder cancer, colostomy and suprapubic catheter, who has felt poorly for one week with increasing shortness of breath, cough, fevers and chills. Presented to his primary care physician today and was found to be tachycardic, febrile, hypoxic and was promptly referred to the emergency room. In the emergency room he was found to be in acute atrial fibrillation with rapid ventricular response,  code sepsis was started and patient was given boluses of saline. He remains tachypneic, hypoxic, heart rate in the 130s on a Cardizem drip. He will be admitted to step down unit. Cardiology consultation has been requested by EDP   Review of Systems:  The patient has a colostomy The patient has a urostomy The patient denies anorexia, weight loss,, vision loss, decreased hearing, hoarseness, chest pain, syncope, balance deficits, hemoptysis, abdominal pain, severe indigestion/heartburn, hematuria,  muscle weakness, suspicious skin lesions, transient blindness, difficulty walking, depression, unusual weight change, abnormal bleeding, enlarged lymph nodes, angioedema  Past Medical History  Diagnosis Date  . Personal history of other diseases of circulatory system   . Other and unspecified coagulation defects   . Depressive disorder, not elsewhere classified   . Pure hypercholesterolemia   . Radiotherapy   . Rectal fistula   . Personal history of tobacco use, presenting hazards to health     1-1 1/2 ppd for 50 years  . Malignant neoplasm of bladder, part unspecified   . Other chronic cystitis   .  Intestinovesical fistula   . Gross hematuria   . Malignant neoplasm of prostate   . Myocardial infarction 1992, 1998  . Aortic aneurysm 2008  . Hypertension   . Tendinitis   . Breathing problem     shallow/ chronic productive cough  . Bronchitis   . GERD (gastroesophageal reflux disease)   . Headache     sinus  . Coronary artery disease   . Left bundle branch block   . Prostate cancer   . Bladder cancer    Past Surgical History  Procedure Laterality Date  . Colon surgery  1966    removed 6 " colon  . Colon surgery  1968    scar tissue  . Balloon dilation  1992    heart attack  . Angioplasty  1993  . Bypass graft  1998    heart attack  . Prostate ca  04/1997    Dr. Dayton Scrape  . Radiation seeding implant  05/1997  . Coloc cauterization  05/1998  . Bladder ca  08/2006  . Cystoscopy  1999    with biopsy x2  . Spt tube  2011  . Prostate surgery  1998    seed inplantation  radiation treatments  . Coronary artery bypass graft    . Cardiac catheterization    . Cataract extraction    . Laparoscopic diverted colostomy  08/04/2012    Procedure: LAPAROSCOPIC DIVERTED COLOSTOMY;  Surgeon: Mariella Saa, MD;  Location: [facility];  Service: General;  Laterality: N/A;  Laparoscopic Colostomy, surgery start time 13:24  . Cystostomy  08/04/2012    Procedure: CYSTOSTOMY SUPRAPUBIC;  Surgeon: Valetta Fuller, MD;  Location: [facility];  Service: Urology;  Laterality: N/A;  Cysto via Supra Pubic Tract and Insertion of New Suprapubic Tube, possible Bladder Biopsy, PROCEDURE START 1300, STOP 1314  . Cystoscopy N/A 10/22/2012    Procedure: CYSTOSCOPY;  Surgeon: Valetta Fuller, MD;  Location: [facility];  Service: Urology;  Laterality: N/A;  Flexible CYSTOSCOPY VIA SUPRAPUBIC TRACT, HOLMIUM LASER LITHOTRIPSY, NEW SUPRAPUBIC TUBE INSERTION   . Insertion of suprapubic catheter N/A 10/22/2012    Procedure: INSERTION OF SUPRAPUBIC CATHETER;  Surgeon: Valetta Fuller, MD;  Location: [facility];  Service: Urology;   Laterality: N/A;  . Holmium laser application N/A 10/22/2012    Procedure: HOLMIUM LASER APPLICATION;  Surgeon: Valetta Fuller, MD;  Location: [facility];  Service: Urology;  Laterality: N/A;   Social History:  reports that he quit smoking about 16 years ago. His smoking use included Cigarettes. He smoked 0.00 packs per day for 50 years. He has never used smokeless tobacco. He reports that he does not drink alcohol or use illicit drugs. The patient lives with his wife The patient just lost his youngest son to cancer   Allergies  Allergen Reactions  . Percocet (Oxycodone-Acetaminophen) Anaphylaxis and Shortness Of Breath  . Ciprofloxacin Tinitus  . Influenza Vac Split (Flu Virus Vaccine) Other (See Comments)    Gets the flu  . Tape Other (See Comments)    Blisters skin---------------------USE PAPER TAPE  . Latex Rash    Family History  Problem Relation Age of Onset  . Stroke Mother     Ischemic Stroke  . Cancer Father     prostate cancer    Prior to Admission medications   Medication Sig Start Date End Date Taking? Authorizing Provider  aspirin 81 MG tablet Take 81 mg by mouth daily.   Yes Historical Provider, MD  buPROPion (WELLBUTRIN) 100 MG tablet Take 100 mg by mouth 2 (two) times daily.  07/16/12  Yes Historical Provider, MD  citalopram (CELEXA) 20 MG tablet Take 20 mg by mouth every evening.  05/19/12  Yes Historical Provider, MD  Cranberry (SM CRANBERRY) 300 MG tablet Take 300 mg by mouth 3 (three) times daily.    Yes Historical Provider, MD  dextromethorphan-guaiFENesin (MUCINEX DM) 30-600 MG per 12 hr tablet Take 2 tablets by mouth every 12 (twelve) hours.   Yes Historical Provider, MD  isosorbide mononitrate (IMDUR) 30 MG 24 hr tablet Take 30 mg by mouth daily before breakfast.  05/26/12  Yes Historical Provider, MD  metoprolol tartrate (LOPRESSOR) 25 MG tablet Take 25 mg by mouth daily.  07/16/12  Yes Historical Provider, MD  Multiple Vitamins-Minerals (MULTIVITAMIN WITH  MINERALS) tablet Take 1 tablet by mouth daily.   Yes Historical Provider, MD  psyllium (REGULOID) 0.52 G capsule Take 104 g by mouth 2 (two) times daily.   Yes Historical Provider, MD  simvastatin (ZOCOR) 40 MG tablet Take 40 mg by mouth daily before breakfast.  07/16/12  Yes Historical Provider, MD   Physical Exam: Filed Vitals:   11/25/12 1615 11/25/12 1630 11/25/12 1645 11/25/12 1655  BP: 117/58 136/105 120/61   Pulse: 45 52 123   Temp:      TempSrc:      Resp: 19 21 21    SpO2: 95% 99% 91% 94%   Patient Vitals for the past 24 hrs:  BP Temp Temp src Pulse Resp SpO2  11/25/12 1655 - - - - - 94 %  11/25/12 1645 120/61 mmHg - - 123 21 91 %  11/25/12  1630 136/105 mmHg - - 52 21 99 %  11/25/12 1615 117/58 mmHg - - 45 19 95 %  11/25/12 1600 113/45 mmHg - - 74 22 99 %  11/25/12 1549 - - - - - 97 %  11/25/12 1545 111/69 mmHg - - 136 22 96 %  11/25/12 1530 126/77 mmHg - - 150 26 93 %  11/25/12 1530 111/61 mmHg - - 151 21 94 %  11/25/12 1500 126/77 mmHg - - - 18 -  11/25/12 1455 115/85 mmHg 102.3 F (39.1 C) Rectal 163 26 94 %  11/25/12 1450 - 102.3 F (39.1 C) Rectal - - -  11/25/12 1445 115/85 mmHg - - 171 26 94 %  11/25/12 1440 - - - - - 91 %     General:  Alert, moderate respiratory distress  Eyes: Pupil equal round and reactive and accommodation  ENT: Clear pharynx  Neck: JVD up to angle of the jaw  Cardiovascular: Irregularly irregular, tachycardic, no murmurs  Respiratory: Bilateral rhonchi and crackles  Abdomen: Soft, left-sided colostomy, suprapubic catheter with clear urine   Skin: Pale dry no rashes  Musculoskeletal:  intact  Psychiatric: euthymic   Neurologic: cn 2-12 intact, strength 5/5 all 4  Labs on Admission:  Basic Metabolic Panel:  Recent Labs Lab 11/25/12 1519  NA 134*  K 3.8  CL 99  CO2 19  GLUCOSE 137*  BUN 20  CREATININE 0.91  CALCIUM 8.8   Liver Function Tests:  Recent Labs Lab 11/25/12 1519  AST 48*  ALT 35  ALKPHOS 79   BILITOT 1.2  PROT 7.4  ALBUMIN 2.5*   No results found for this basename: LIPASE, AMYLASE,  in the last 168 hours No results found for this basename: AMMONIA,  in the last 168 hours CBC:  Recent Labs Lab 11/25/12 1519  WBC 16.2*  NEUTROABS 13.1*  HGB 15.2  HCT 45.1  MCV 93.0  PLT 176   Cardiac Enzymes: No results found for this basename: CKTOTAL, CKMB, CKMBINDEX, TROPONINI,  in the last 168 hours  BNP (last 3 results) No results found for this basename: PROBNP,  in the last 8760 hours CBG: No results found for this basename: GLUCAP,  in the last 168 hours  Radiological Exams on Admission: Dg Chest Port 1 View  11/25/2012   *RADIOLOGY REPORT*  Clinical Data: Cough, fever  PORTABLE CHEST - 1 VIEW  Comparison: 10/20/2012  Findings: Previous coronary bypass changes noted.  Increased bibasilar interstitial opacities could represent chronic interstitial lung disease versus early developing basilar edema. Minimal streaky atelectasis also suspected in the lower lobes.  No effusion or pneumothorax.  Background COPD/emphysema suspected.  IMPRESSION: COPD/emphysema  Increased basilar interstitial changes versus developing edema.   Original Report Authenticated By: Judie Petit. Miles Costain, M.D.    EKG: Independently reviewed. Atrial fibrillation and right bundle branch block  Assessment/Plan Principal Problem:   Sepsis Active Problems:   Rectourethral fistula   GERD (gastroesophageal reflux disease)   Depressive disorder, not elsewhere classified   Pure hypercholesterolemia   Malignant neoplasm of prostate   Myocardial infarction   Aortic aneurysm   Hypertension Right bundle branch block   Bladder cancer   S/P CABG x 4   Acute respiratory failure   COPD (chronic obstructive pulmonary disease)   1. Sepsis with either urinary or pulmonary source. Blood cultures and urine cultures were sent. Patient is not hypotensive at the moment. He was put on vancomycin and cefepime while cultures are  pending. 2. Atrial fibrillation  with rapid ventricular response - probably secondary to sepsis. Continue Cardizem drip, beta blocker 25 mg by mouth of metoprolol every 8 hours. 3. Acute respiratory failure probably combination of COPD, CHF, maybe acute lung injury and pneumonia. It is possible that this patient may have a component of interstitial lung disease as his chest x-ray from 10/20/2012 mentions presence of chronic interstitial changes. Continue oxygen via Venturi mask.  4. Coronary artery disease status post coronary artery bypass grafting, history of 2 myocardial infarctions. Continue aspirin and statin. We'll check troponin 5. History of prostate cancer status post radiation therapy with severe radiation colitis and rectourethral fistula status post colostomy and suprapubic catheter. Seems stable  Chella Chapdelaine [facility] Pager [phone] If 7PM-7AM, please contact night-coverage [email or web address] Password [facility] 11/25/2012, 5:21 PM

## 2012-11-25 NOTE — Consult Note (Addendum)
Admit date: 11/25/2012 Referring Physician  Dr. Kevan Ny Primary Physician  Dr. Johnella Moloney Primary Cardiologist  Dr. Verdis Prime Reason for Consultation  Atrial fibrillation with RVR  HPI: Zachary Haas is an 79 y.o. male with past medical history of coronary disease, coronary artery bypass grafting, COPD, and prostate cancer, bladder cancer, colostomy and suprapubic catheter, who has felt poorly for one week with increasing shortness of breath, cough, fevers and chills. Presented to his primary care physician today and was found to be tachycardic, febrile, hypoxic and was promptly referred to the emergency room. In the emergency room he was found to be in acute atrial fibrillation with rapid ventricular response, code sepsis was started and patient was given boluses of saline. He remains tachypneic, hypoxic, heart rate in the 130s on a Cardizem drip. He will be admitted to step down unit. Cardiology consultation has been requested by EDP.      PMH:   Past Medical History  Diagnosis Date  . Personal history of other diseases of circulatory system   . Other and unspecified coagulation defects   . Depressive disorder, not elsewhere classified   . Pure hypercholesterolemia   . Radiotherapy   . Rectal fistula   . Personal history of tobacco use, presenting hazards to health     1-1 1/2 ppd for 50 years  . Malignant neoplasm of bladder, part unspecified   . Other chronic cystitis   . Intestinovesical fistula   . Gross hematuria   . Malignant neoplasm of prostate   . Myocardial infarction 1992, 1998  . Aortic aneurysm 2008  . Hypertension   . Tendinitis   . Breathing problem     shallow/ chronic productive cough  . Bronchitis   . GERD (gastroesophageal reflux disease)   . Headache(784.0)     sinus  . Coronary artery disease   . Left bundle branch block   . Prostate cancer   . Bladder cancer      PSH:   Past Surgical History  Procedure Laterality Date  . Colon surgery  1966   removed 6 " colon  . Colon surgery  1968    scar tissue  . Balloon dilation  1992    heart attack  . Angioplasty  1993  . Bypass graft  1998    heart attack  . Prostate ca  04/1997    Dr. Dayton Scrape  . Radiation seeding implant  05/1997  . Coloc cauterization  05/1998  . Bladder ca  08/2006  . Cystoscopy  1999    with biopsy x2  . Spt tube  2011  . Prostate surgery  1998    seed inplantation  radiation treatments  . Coronary artery bypass graft    . Cardiac catheterization    . Cataract extraction    . Laparoscopic diverted colostomy  08/04/2012    Procedure: LAPAROSCOPIC DIVERTED COLOSTOMY;  Surgeon: Mariella Saa, MD;  Location: [facility];  Service: General;  Laterality: N/A;  Laparoscopic Colostomy, surgery start time 13:24  . Cystostomy  08/04/2012    Procedure: CYSTOSTOMY SUPRAPUBIC;  Surgeon: Valetta Fuller, MD;  Location: [facility];  Service: Urology;  Laterality: N/A;  Cysto via Supra Pubic Tract and Insertion of New Suprapubic Tube, possible Bladder Biopsy, PROCEDURE START 1300, STOP 1314  . Cystoscopy N/A 10/22/2012    Procedure: CYSTOSCOPY;  Surgeon: Valetta Fuller, MD;  Location: [facility];  Service: Urology;  Laterality: N/A;  Flexible CYSTOSCOPY VIA SUPRAPUBIC TRACT, HOLMIUM LASER LITHOTRIPSY, NEW  SUPRAPUBIC TUBE INSERTION   . Insertion of suprapubic catheter N/A 10/22/2012    Procedure: INSERTION OF SUPRAPUBIC CATHETER;  Surgeon: Valetta Fuller, MD;  Location: [facility];  Service: Urology;  Laterality: N/A;  . Holmium laser application N/A 10/22/2012    Procedure: HOLMIUM LASER APPLICATION;  Surgeon: Valetta Fuller, MD;  Location: [facility];  Service: Urology;  Laterality: N/A;    Allergies:  Percocet; Ciprofloxacin; Influenza vac split; Tape; and Latex Prior to Admit Meds:   Prescriptions prior to admission  Medication Sig Dispense Refill  . aspirin 81 MG tablet Take 81 mg by mouth daily.      Marland Kitchen buPROPion (WELLBUTRIN) 100 MG tablet Take 100 mg by mouth 2 (two) times daily.       .  citalopram (CELEXA) 20 MG tablet Take 20 mg by mouth every evening.       . Cranberry (SM CRANBERRY) 300 MG tablet Take 300 mg by mouth 3 (three) times daily.       Marland Kitchen dextromethorphan-guaiFENesin (MUCINEX DM) 30-600 MG per 12 hr tablet Take 2 tablets by mouth every 12 (twelve) hours.      . isosorbide mononitrate (IMDUR) 30 MG 24 hr tablet Take 30 mg by mouth daily before breakfast.       . metoprolol tartrate (LOPRESSOR) 25 MG tablet Take 25 mg by mouth daily.       . Multiple Vitamins-Minerals (MULTIVITAMIN WITH MINERALS) tablet Take 1 tablet by mouth daily.      . psyllium (REGULOID) 0.52 G capsule Take 104 g by mouth 2 (two) times daily.      . simvastatin (ZOCOR) 40 MG tablet Take 40 mg by mouth daily before breakfast.        Fam HX:    Family History  Problem Relation Age of Onset  . Stroke Mother     Ischemic Stroke  . Cancer Father     prostate cancer   Social HX:    History   Social History  . Marital Status: Married    Spouse Name: N/A    Number of Children: N/A  . Years of Education: N/A   Occupational History  . Not on file.   Social History Main Topics  . Smoking status: Former Smoker -- 50 years    Types: Cigarettes    Quit date: 07/02/1996  . Smokeless tobacco: Never Used     Comment: quit 1998  . Alcohol Use: No  . Drug Use: No  . Sexually Active: Not on file   Other Topics Concern  . Not on file   Social History Narrative  . No narrative on file     ROS:  All 11 ROS were addressed and are negative except what is stated in the HPI  Physical Exam: Blood pressure 95/72, pulse 93, temperature 98.1 F (36.7 C), temperature source Oral, resp. rate 22, height 5\' 8"  (1.727 m), weight 100.6 kg (221 lb 12.5 oz), SpO2 92.00%.    General: Well developed, well nourished, in no acute distress Head: Eyes PERRLA, No xanthomas.   Normal cephalic and atramatic  Lungs:   Scattered wheezes Heart:   RRR S1 S2 Pulses are 2+ & equal.            No carotid bruit. No  JVD.  No abdominal bruits. No femoral bruits. Abdomen: Bowel sounds are positive, abdomen soft and non-tender without masses  Extremities:   No clubbing, cyanosis or edema.  DP +1 Neuro: Alert and oriented X  3. Psych:  Good affect, responds appropriately    Labs:   Lab Results  Component Value Date   WBC 16.2* 11/25/2012   HGB 15.2 11/25/2012   HCT 45.1 11/25/2012   MCV 93.0 11/25/2012   PLT 176 11/25/2012    Recent Labs Lab 11/25/12 1519  NA 134*  K 3.8  CL 99  CO2 19  BUN 20  CREATININE 0.91  CALCIUM 8.8  PROT 7.4  BILITOT 1.2  ALKPHOS 79  ALT 35  AST 48*  GLUCOSE 137*   No results found for this basename: PTT   Lab Results  Component Value Date   INR 1.15 11/25/2012   INR 1.0 09/13/2008   Lab Results  Component Value Date   CKTOTAL 75 09/13/2008   CKMB 1.5 09/13/2008   TROPONINI <0.30 11/25/2012     Lab Results  Component Value Date   CHOL  Value: 137        ATP III CLASSIFICATION:  <200     mg/dL   Desirable  657-846  mg/dL   Borderline High  >=962    mg/dL   High        9/52/8413   Lab Results  Component Value Date   HDL 35* 09/13/2008   Lab Results  Component Value Date   LDLCALC  Value: 88        Total Cholesterol/HDL:CHD Risk Coronary Heart Disease Risk Table                     Men   Women  1/2 Average Risk   3.4   3.3  Average Risk       5.0   4.4  2 X Average Risk   9.6   7.1  3 X Average Risk  23.4   11.0        Use the calculated Patient Ratio above and the CHD Risk Table to determine the patient's CHD Risk.        ATP III CLASSIFICATION (LDL):  <100     mg/dL   Optimal  244-010  mg/dL   Near or Above                    Optimal  130-159  mg/dL   Borderline  272-536  mg/dL   High  >644     mg/dL   Very High 0/34/7425   Lab Results  Component Value Date   TRIG 70 09/13/2008   Lab Results  Component Value Date   CHOLHDL 3.9 09/13/2008   No results found for this basename: LDLDIRECT      Radiology:  Dg Chest Port 1 View  11/25/2012   *RADIOLOGY  REPORT*  Clinical Data: Cough, fever  PORTABLE CHEST - 1 VIEW  Comparison: 10/20/2012  Findings: Previous coronary bypass changes noted.  Increased bibasilar interstitial opacities could represent chronic interstitial lung disease versus early developing basilar edema. Minimal streaky atelectasis also suspected in the lower lobes.  No effusion or pneumothorax.  Background COPD/emphysema suspected.  IMPRESSION: COPD/emphysema  Increased basilar interstitial changes versus developing edema.   Original Report Authenticated By: Judie Petit. Shick, M.D.    EKG:  Atrial fibrillation with RVR and RBBB  ASSESSMENT:  1.  New onset atrial fibrillation with RVR of unknown duration - ? On tele whether he is now back in NSR 2.  Sepsis syndrome ? Urinary vs. Pulmonary source 3.  Acute respiratory failure multifactorial from COPD/CHF and ? PNA 4.  Acute  diastolic CHF secondary to #1 4.  CAD s/p CABG 5.  Prior Prostated CA s/p XRT complicated by radiation colitis and rectourethral fistula  PLAN:   1.  IV Cardizem gtt for rate control as BP tolerates 2.  Cycle cardiac enzymes 3.  Repeat 2D echo to eval LVF 4.  IV Heparin gtt per pharmacy for afib - patient will need long term anticoagulation given CHADS VASC score of 3 (age/HTN and CAD) 5.  IV Lasix as BP tolerates 6 . Check repeat EKG to confirm rhythm  Quintella Reichert, MD  11/25/2012  7:22 PM

## 2012-11-25 NOTE — ED Provider Notes (Signed)
History     CSN: [id number]  Arrival date & time 11/25/12  1436   First MD Initiated Contact with Patient 11/25/12 1442      Chief Complaint  Patient presents with  . Code Sepsis    (Consider location/radiation/quality/duration/timing/severity/associated sxs/prior treatment) HPI Comments: Level 5 caveat due to patient condition.  Pt with h/o prostate cancer, current smoker has had weakness, SOB and cough along with fever intermittent for a few weeks.  He went to see PCP at [facility] and was found to be tachycardic, febrile, tachypneic and thus EMS called to be brought to the ED.  Pt denies h/o CAD, atrial fibrillation.  He reports he can feel palpitations that it began on the ambulance enroute, but ti seems he was tachycardic at the office.  He has taken antipyretics at home, but not given any at the office.  Pt has been coughing, has not had pneumonia in the past that he knows of.  He was in the hospital about 1 month ago.  He denies dysuria, urinary frequency.  No abd pain, N/V/D.    The history is provided by the patient and the EMS personnel. The history is limited by the condition of the patient.    Past Medical History  Diagnosis Date  . Personal history of other diseases of circulatory system   . Other malignant neoplasm without specification of site   . Other and unspecified coagulation defects   . Depressive disorder, not elsewhere classified   . Pure hypercholesterolemia   . Radiotherapy   . Rectal fistula   . Personal history of tobacco use, presenting hazards to health     1-1 1/2 ppd for 50 years  . Malignant neoplasm of bladder, part unspecified   . Other chronic cystitis   . Intestinovesical fistula   . Gross hematuria   . Malignant neoplasm of prostate   . Myocardial infarction 1992, 1998  . Aortic aneurysm 2008  . Hypertension   . Tendinitis   . Breathing problem     shallow/ chronic productive cough  . Bronchitis   . GERD (gastroesophageal reflux  disease)   . Headache     sinus  . Coronary artery disease   . Left bundle branch block     Past Surgical History  Procedure Laterality Date  . Colon surgery  1966    removed 6 " colon  . Colon surgery  1968    scar tissue  . Balloon dilation  1992    heart attack  . Angioplasty  1993  . Bypass graft  1998    heart attack  . Prostate ca  04/1997    Dr. Dayton Scrape  . Radiation seeding implant  05/1997  . Coloc cauterization  05/1998  . Bladder ca  08/2006  . Cystoscopy  1999    with biopsy x2  . Spt tube  2011  . Prostate surgery  1998    seed inplantation  radiation treatments  . Coronary artery bypass graft    . Cardiac catheterization    . Cataract extraction    . Laparoscopic diverted colostomy  08/04/2012    Procedure: LAPAROSCOPIC DIVERTED COLOSTOMY;  Surgeon: Mariella Saa, MD;  Location: [facility];  Service: General;  Laterality: N/A;  Laparoscopic Colostomy, surgery start time 13:24  . Cystostomy  08/04/2012    Procedure: CYSTOSTOMY SUPRAPUBIC;  Surgeon: Valetta Fuller, MD;  Location: [facility];  Service: Urology;  Laterality: N/A;  Cysto via Supra Pubic Tract and  Insertion of New Suprapubic Tube, possible Bladder Biopsy, PROCEDURE START 1300, STOP 1314  . Cystoscopy N/A 10/22/2012    Procedure: CYSTOSCOPY;  Surgeon: Valetta Fuller, MD;  Location: [facility];  Service: Urology;  Laterality: N/A;  Flexible CYSTOSCOPY VIA SUPRAPUBIC TRACT, HOLMIUM LASER LITHOTRIPSY, NEW SUPRAPUBIC TUBE INSERTION   . Insertion of suprapubic catheter N/A 10/22/2012    Procedure: INSERTION OF SUPRAPUBIC CATHETER;  Surgeon: Valetta Fuller, MD;  Location: [facility];  Service: Urology;  Laterality: N/A;  . Holmium laser application N/A 10/22/2012    Procedure: HOLMIUM LASER APPLICATION;  Surgeon: Valetta Fuller, MD;  Location: [facility];  Service: Urology;  Laterality: N/A;    Family History  Problem Relation Age of Onset  . Stroke Mother     Ischemic Stroke  . Cancer Father     prostate cancer     History  Substance Use Topics  . Smoking status: Former Smoker -- 50 years    Types: Cigarettes    Quit date: 07/02/1996  . Smokeless tobacco: Never Used     Comment: quit 1998  . Alcohol Use: No      Review of Systems  Unable to perform ROS: Acuity of condition    Allergies  Percocet; Ciprofloxacin; Influenza vac split; Tape; and Latex  Home Medications   Current Outpatient Rx  Name  Route  Sig  Dispense  Refill  . aspirin 81 MG tablet   Oral   Take 81 mg by mouth daily.         Marland Kitchen buPROPion (WELLBUTRIN) 100 MG tablet   Oral   Take 100 mg by mouth 2 (two) times daily.          . citalopram (CELEXA) 20 MG tablet   Oral   Take 20 mg by mouth every evening.          . Cranberry (SM CRANBERRY) 300 MG tablet   Oral   Take 300 mg by mouth 3 (three) times daily.          Marland Kitchen dextromethorphan-guaiFENesin (MUCINEX DM) 30-600 MG per 12 hr tablet   Oral   Take 2 tablets by mouth every 12 (twelve) hours.         . isosorbide mononitrate (IMDUR) 30 MG 24 hr tablet   Oral   Take 30 mg by mouth daily before breakfast.          . metoprolol tartrate (LOPRESSOR) 25 MG tablet   Oral   Take 25 mg by mouth daily.          . Multiple Vitamins-Minerals (MULTIVITAMIN WITH MINERALS) tablet   Oral   Take 1 tablet by mouth daily.         . psyllium (REGULOID) 0.52 G capsule   Oral   Take 104 g by mouth 2 (two) times daily.         . simvastatin (ZOCOR) 40 MG tablet   Oral   Take 40 mg by mouth daily before breakfast.            BP 126/77  Pulse 150  Temp(Src) 102.3 F (39.1 C) (Rectal)  Resp 26  SpO2 97%  Physical Exam  Nursing note and vitals reviewed. Constitutional: He is oriented to person, place, and time. He appears well-developed and well-nourished. He appears distressed.  HENT:  Head: Normocephalic.  Eyes: EOM are normal.  Neck: JVD present.  Cardiovascular: Intact distal pulses.  An irregularly irregular rhythm present. Tachycardia  present.   Pulmonary/Chest:  Accessory muscle usage present. Tachypnea noted. He is in respiratory distress. He has rhonchi. He has rales.  Abdominal: Soft. Normal appearance. He exhibits no distension. There is no tenderness. There is no rebound, no guarding and no CVA tenderness.    Neurological: He is alert and oriented to person, place, and time. No cranial nerve deficit. He exhibits normal muscle tone. Coordination normal.  Skin: Skin is warm. No rash noted. He is diaphoretic.  Psychiatric: He has a normal mood and affect.    ED Course  Procedures (including critical care time)  CRITICAL CARE Performed by: Lear Ng. Total critical care time: 30 min Critical care time was exclusive of separately billable procedures and treating other patients. Critical care was necessary to treat or prevent imminent or life-threatening deterioration. Critical care was time spent personally by me on the following activities: development of treatment plan with patient and/or surrogate as well as nursing, discussions with consultants, evaluation of patient's response to treatment, examination of patient, obtaining history from patient or surrogate, ordering and performing treatments and interventions, ordering and review of laboratory studies, ordering and review of radiographic studies, pulse oximetry and re-evaluation of patient's condition.   Labs Reviewed  CBC WITH DIFFERENTIAL - Abnormal; Notable for the following:    WBC 16.2 (*)    Neutrophils Relative % 81 (*)    Lymphocytes Relative 10 (*)    Neutro Abs 13.1 (*)    Monocytes Absolute 1.5 (*)    All other components within normal limits  COMPREHENSIVE METABOLIC PANEL - Abnormal; Notable for the following:    Sodium 134 (*)    Glucose, Bld 137 (*)    Albumin 2.5 (*)    AST 48 (*)    GFR calc non Af Amer 77 (*)    GFR calc Af Amer 89 (*)    All other components within normal limits  URINALYSIS, ROUTINE W REFLEX MICROSCOPIC - Abnormal;  Notable for the following:    Color, Urine AMBER (*)    APPearance CLOUDY (*)    Hgb urine dipstick LARGE (*)    Bilirubin Urine SMALL (*)    Protein, ur 100 (*)    Leukocytes, UA LARGE (*)    All other components within normal limits  URINE MICROSCOPIC-ADD ON - Abnormal; Notable for the following:    Bacteria, UA MANY (*)    All other components within normal limits  CG4 I-STAT (LACTIC ACID) - Abnormal; Notable for the following:    Lactic Acid, Venous 2.74 (*)    All other components within normal limits  POCT I-STAT 3, BLOOD GAS (G3+) - Abnormal; Notable for the following:    pO2, Arterial 138.0 (*)    Acid-base deficit 3.0 (*)    All other components within normal limits  CULTURE, BLOOD (ROUTINE X 2)  CULTURE, BLOOD (ROUTINE X 2)  URINE CULTURE  CULTURE, EXPECTORATED SPUTUM-ASSESSMENT  PROCALCITONIN  APTT  PROTIME-INR   Dg Chest Port 1 View  11/25/2012   *RADIOLOGY REPORT*  Clinical Data: Cough, fever  PORTABLE CHEST - 1 VIEW  Comparison: 10/20/2012  Findings: Previous coronary bypass changes noted.  Increased bibasilar interstitial opacities could represent chronic interstitial lung disease versus early developing basilar edema. Minimal streaky atelectasis also suspected in the lower lobes.  No effusion or pneumothorax.  Background COPD/emphysema suspected.  IMPRESSION: COPD/emphysema  Increased basilar interstitial changes versus developing edema.   Original Report Authenticated By: Judie Petit. Shick, M.D.     1. Sepsis   2. Atrial fibrillation  with RVR   3. Respiratory distress   4. Healthcare-associated pneumonia     Saturation on facemask is 93% which is adequate, but abnormal  ECG at time 14:48 atrial fibrillation with RBBB, PVC's, at rate 177, RVR.  Abn ECG  3:48 PM HR is now down to 140's.  Family reports pt did have waxing and waning mental status last night.  Will get ABG here.  Will hold off on bipap as his respiratiosn seem improved after IVF's, and continued O2 here.   Pt coughed up greenish sputum, will send for culture.  Abx being given.    3:57 PM Lactic acid is 2.7.  Pt's breathing is improving, if O2 can be weaned, will admit to step down.    MDM  Pt with tachycardic, likely atrial fibrillation, hypoxemic, respiratory distress.  Fever rectal to 102, suspect possibly pneumonia, health care associated.  Given resp distress, meeting SIRS criteria, alerted level 2 code sepsis.  wil give antipyretics, will start early abx, IVF's and reassess.  BiPAP, check CXR, will need admission.  Will closely monitor heart rate, rhythm consider diltiazem.          Gavin Pound. Jahmal Dunavant, MD 11/25/12 1642

## 2012-11-25 NOTE — ED Notes (Signed)
1L of NS left wide open. Pt warm and dry, labored breathing- pt able to speak in full sentences. Radial pulses palpated.

## 2012-11-25 NOTE — ED Notes (Signed)
Wife, estelene Jane  can be reached at  [phone]

## 2012-11-26 LAB — URINE CULTURE: Colony Count: >=100000

## 2012-11-26 LAB — COMPREHENSIVE METABOLIC PANEL
Albumin: 2.3 g/dL — ABNORMAL LOW (ref 3.5–5.2)
BUN: 18 mg/dL (ref 6–23)
Calcium: 8.8 mg/dL (ref 8.4–10.5)
Creatinine, Ser: 0.91 mg/dL (ref 0.50–1.35)
Total Bilirubin: 0.9 mg/dL (ref 0.3–1.2)
Total Protein: 6.5 g/dL (ref 6.0–8.3)

## 2012-11-26 LAB — TROPONIN I: Troponin I: <0.3 ng/mL (ref <0.30)

## 2012-11-26 LAB — CBC
HCT: 42 % (ref 39.0–52.0)
MCV: 93.3 fL (ref 78.0–100.0)
Platelets: 166 10*3/uL (ref 150–400)
RBC: 4.5 MIL/uL (ref 4.22–5.81)
WBC: 10.9 10*3/uL — ABNORMAL HIGH (ref 4.0–10.5)

## 2012-11-26 MED ORDER — ENOXAPARIN SODIUM 100 MG/ML ~~LOC~~ SOLN
100.0000 mg | Freq: Two times a day (BID) | SUBCUTANEOUS | Status: DC
  Administered 2012-11-26 (×2): 100 mg via SUBCUTANEOUS
  Filled 2012-11-26 (×5): qty 1

## 2012-11-26 MED ORDER — BIOTENE DRY MOUTH MT LIQD
15.0000 mL | Freq: Two times a day (BID) | OROMUCOSAL | Status: DC
  Administered 2012-11-26 – 2012-12-01 (×9): 15 mL via OROMUCOSAL

## 2012-11-26 MED ORDER — DILTIAZEM HCL 60 MG PO TABS
60.0000 mg | ORAL_TABLET | Freq: Four times a day (QID) | ORAL | Status: DC
  Administered 2012-11-26 – 2012-11-27 (×4): 60 mg via ORAL
  Filled 2012-11-26 (×8): qty 1

## 2012-11-26 NOTE — Progress Notes (Signed)
Subjective: Patient looks much better today and is conversive and much more energetic. Very confused to my office yesterday and febrile. Afebrile today. Cough is much improved.  Objective: Weight change:   Intake/Output Summary (Last 24 hours) at 11/26/12 2111 Last data filed at 11/26/12 2000  Gross per 24 hour  Intake 1435.5 ml  Output   2000 ml  Net -564.5 ml   Filed Vitals:   11/26/12 1205 11/26/12 1632 11/26/12 1859 11/26/12 2027  BP: 95/44 117/102 128/50 108/67  Pulse: 83 75  89  Temp: 97.9 F (36.6 C) 98.5 F (36.9 C)  98 F (36.7 C)  TempSrc: Oral Oral  Oral  Resp: 18 19  23   Height:      Weight:      SpO2: 92% 93%  92%    General Appearance: Alert, cooperative, no distress, appears stated age Lungs: Clear to auscultation bilaterally but rhonchi with cough Heart: Irregular rhythm, S1 and S2 normal, no murmur, rub or gallop. Rate controlled Abdomen: Soft, non-tender, bowel sounds active all four quadrants, no masses, no organomegaly. Patient has colostomy as well as suprapubic tube and urine is slightly cloudy Extremities: Extremities normal, atraumatic, no cyanosis or edema Neuro: Oriented x2, to name and place, nonfocal exam  Lab Results: Results for orders placed during the hospital encounter of 11/25/12 (from the past 48 hour(s))  CULTURE, BLOOD (ROUTINE X 2)     Status: None   Collection Time    11/25/12  3:00 PM      Result Value Range   Specimen Description BLOOD RIGHT ARM     Special Requests BOTTLES DRAWN AEROBIC AND ANAEROBIC 10CC     Culture  Setup Time 11/25/2012 20:35     Culture       Value:        BLOOD CULTURE RECEIVED NO GROWTH TO DATE CULTURE WILL BE HELD FOR 5 DAYS BEFORE ISSUING A FINAL NEGATIVE REPORT   Report Status PENDING    CULTURE, BLOOD (ROUTINE X 2)     Status: None   Collection Time    11/25/12  3:15 PM      Result Value Range   Specimen Description BLOOD RIGHT HAND     Special Requests       Value: BOTTLES DRAWN AEROBIC AND  ANAEROBIC 10CC BLUE 7CC RED   Culture  Setup Time 11/25/2012 20:36     Culture       Value:        BLOOD CULTURE RECEIVED NO GROWTH TO DATE CULTURE WILL BE HELD FOR 5 DAYS BEFORE ISSUING A FINAL NEGATIVE REPORT   Report Status PENDING    CBC WITH DIFFERENTIAL     Status: Abnormal   Collection Time    11/25/12  3:19 PM      Result Value Range   WBC 16.2 (*) 4.0 - 10.5 K/uL   RBC 4.85  4.22 - 5.81 MIL/uL   Hemoglobin 15.2  13.0 - 17.0 g/dL   HCT 16.1  09.6 - 04.5 %   MCV 93.0  78.0 - 100.0 fL   MCH 31.3  26.0 - 34.0 pg   MCHC 33.7  30.0 - 36.0 g/dL   RDW 40.9  81.1 - 91.4 %   Platelets 176  150 - 400 K/uL   Neutrophils Relative % 81 (*) 43 - 77 %   Lymphocytes Relative 10 (*) 12 - 46 %   Monocytes Relative 9  3 - 12 %   Eosinophils Relative 0  0 - 5 %   Basophils Relative 0  0 - 1 %   Neutro Abs 13.1 (*) 1.7 - 7.7 K/uL   Lymphs Abs 1.6  0.7 - 4.0 K/uL   Monocytes Absolute 1.5 (*) 0.1 - 1.0 K/uL   Eosinophils Absolute 0.0  0.0 - 0.7 K/uL   Basophils Absolute 0.0  0.0 - 0.1 K/uL   WBC Morphology TOXIC GRANULATION     Comment: FEW ATYPICAL LYMPHS NOTED  COMPREHENSIVE METABOLIC PANEL     Status: Abnormal   Collection Time    11/25/12  3:19 PM      Result Value Range   Sodium 134 (*) 135 - 145 mEq/L   Potassium 3.8  3.5 - 5.1 mEq/L   Chloride 99  96 - 112 mEq/L   CO2 19  19 - 32 mEq/L   Glucose, Bld 137 (*) 70 - 99 mg/dL   BUN 20  6 - 23 mg/dL   Creatinine, Ser 9.14  0.50 - 1.35 mg/dL   Calcium 8.8  8.4 - 78.2 mg/dL   Total Protein 7.4  6.0 - 8.3 g/dL   Albumin 2.5 (*) 3.5 - 5.2 g/dL   AST 48 (*) 0 - 37 U/L   ALT 35  0 - 53 U/L   Alkaline Phosphatase 79  39 - 117 U/L   Total Bilirubin 1.2  0.3 - 1.2 mg/dL   GFR calc non Af Amer 77 (*) >90 mL/min   GFR calc Af Amer 89 (*) >90 mL/min   Comment:            The eGFR has been calculated     using the CKD EPI equation.     This calculation has not been     validated in all clinical     situations.     eGFR's persistently      <90 mL/min signify     possible Chronic Kidney Disease.  PROCALCITONIN     Status: None   Collection Time    11/25/12  3:19 PM      Result Value Range   Procalcitonin 0.10     Comment:            Interpretation:     PCT (Procalcitonin) <= 0.5 ng/mL:     Systemic infection (sepsis) is not likely.     Local bacterial infection is possible.     (NOTE)             ICU PCT Algorithm               Non ICU PCT Algorithm        ----------------------------     ------------------------------             PCT < 0.25 ng/mL                 PCT < 0.1 ng/mL         Stopping of antibiotics            Stopping of antibiotics           strongly encouraged.               strongly encouraged.        ----------------------------     ------------------------------           PCT level decrease by               PCT < 0.25 ng/mL           >=  80% from peak PCT           OR PCT 0.25 - 0.5 ng/mL          Stopping of antibiotics                                                 encouraged.         Stopping of antibiotics               encouraged.        ----------------------------     ------------------------------           PCT level decrease by              PCT >= 0.25 ng/mL           < 80% from peak PCT            AND PCT >= 0.5 ng/mL            Continuing antibiotics                                                  encouraged.           Continuing antibiotics                encouraged.        ----------------------------     ------------------------------         PCT level increase compared          PCT > 0.5 ng/mL             with peak PCT AND              PCT >= 0.5 ng/mL             Escalation of antibiotics                                              strongly encouraged.          Escalation of antibiotics            strongly encouraged.  APTT     Status: None   Collection Time    11/25/12  3:19 PM      Result Value Range   aPTT 29  24 - 37 seconds  PROTIME-INR     Status: None   Collection  Time    11/25/12  3:19 PM      Result Value Range   Prothrombin Time 14.5  11.6 - 15.2 seconds   INR 1.15  0.00 - 1.49  CG4 I-STAT (LACTIC ACID)     Status: Abnormal   Collection Time    11/25/12  3:54 PM      Result Value Range   Lactic Acid, Venous 2.74 (*) 0.5 - 2.2 mmol/L  CULTURE, EXPECTORATED SPUTUM-ASSESSMENT     Status: None   Collection Time    11/25/12  3:56 PM      Result Value Range   Specimen Description SPUTUM     Special Requests Normal     Sputum  evaluation       Value: THIS SPECIMEN IS ACCEPTABLE. RESPIRATORY CULTURE REPORT TO FOLLOW.   Report Status 11/25/2012 FINAL    CULTURE, RESPIRATORY (NON-EXPECTORATED)     Status: None   Collection Time    11/25/12  3:56 PM      Result Value Range   Specimen Description SPUTUM     Special Requests NONE     Gram Stain       Value: ABUNDANT WBC PRESENT, PREDOMINANTLY PMN     NO SQUAMOUS EPITHELIAL CELLS SEEN     MODERATE GRAM POSITIVE RODS   Culture PENDING     Report Status PENDING    URINALYSIS, ROUTINE W REFLEX MICROSCOPIC     Status: Abnormal   Collection Time    11/25/12  4:00 PM      Result Value Range   Color, Urine AMBER (*) YELLOW   Comment: BIOCHEMICALS MAY BE AFFECTED BY COLOR   APPearance CLOUDY (*) CLEAR   Specific Gravity, Urine 1.024  1.005 - 1.030   pH 5.5  5.0 - 8.0   Glucose, UA NEGATIVE  NEGATIVE mg/dL   Hgb urine dipstick LARGE (*) NEGATIVE   Bilirubin Urine SMALL (*) NEGATIVE   Ketones, ur NEGATIVE  NEGATIVE mg/dL   Protein, ur 161 (*) NEGATIVE mg/dL   Urobilinogen, UA 1.0  0.0 - 1.0 mg/dL   Nitrite NEGATIVE  NEGATIVE   Leukocytes, UA LARGE (*) NEGATIVE  POCT I-STAT 3, BLOOD GAS (G3+)     Status: Abnormal   Collection Time    11/25/12  4:00 PM      Result Value Range   pH, Arterial 7.372  7.350 - 7.450   pCO2 arterial 37.5  35.0 - 45.0 mmHg   pO2, Arterial 138.0 (*) 80.0 - 100.0 mmHg   Bicarbonate 21.3  20.0 - 24.0 mEq/L   TCO2 22  0 - 100 mmol/L   O2 Saturation 99.0     Acid-base  deficit 3.0 (*) 0.0 - 2.0 mmol/L   Patient temperature 102.3 F     Collection site RADIAL, ALLEN'S TEST ACCEPTABLE     Drawn by Operator     Sample type ARTERIAL    URINE MICROSCOPIC-ADD ON     Status: Abnormal   Collection Time    11/25/12  4:00 PM      Result Value Range   Squamous Epithelial / LPF RARE  RARE   WBC, UA 21-50  <3 WBC/hpf   RBC / HPF 0-2  <3 RBC/hpf   Bacteria, UA MANY (*) RARE   Urine-Other MUCOUS PRESENT    URINE CULTURE     Status: None   Collection Time    11/25/12  4:01 PM      Result Value Range   Specimen Description URINE, CATHETERIZED     Special Requests NONE     Culture  Setup Time 11/25/2012 15:25     Colony Count >=100,000 COLONIES/ML     Culture       Value: Multiple bacterial morphotypes present, none predominant. Suggest appropriate recollection if clinically indicated.   Report Status 11/26/2012 FINAL    PRO B NATRIURETIC PEPTIDE     Status: Abnormal   Collection Time    11/25/12  6:15 PM      Result Value Range   Pro B Natriuretic peptide (BNP) 2648.0 (*) 0 - 450 pg/mL  TROPONIN I     Status: None   Collection Time    11/25/12  6:15 PM  Result Value Range   Troponin I <0.30  <0.30 ng/mL   Comment:            Due to the release kinetics of cTnI,     a negative result within the first hours     of the onset of symptoms does not rule out     myocardial infarction with certainty.     If myocardial infarction is still suspected,     repeat the test at appropriate intervals.  MRSA PCR SCREENING     Status: None   Collection Time    11/25/12  7:04 PM      Result Value Range   MRSA by PCR NEGATIVE  NEGATIVE   Comment:            The GeneXpert MRSA Assay (FDA     approved for NASAL specimens     only), is one component of a     comprehensive MRSA colonization     surveillance program. It is not     intended to diagnose MRSA     infection nor to guide or     monitor treatment for     MRSA infections.  TROPONIN I     Status: None    Collection Time    11/26/12  2:22 AM      Result Value Range   Troponin I <0.30  <0.30 ng/mL   Comment:            Due to the release kinetics of cTnI,     a negative result within the first hours     of the onset of symptoms does not rule out     myocardial infarction with certainty.     If myocardial infarction is still suspected,     repeat the test at appropriate intervals.  COMPREHENSIVE METABOLIC PANEL     Status: Abnormal   Collection Time    11/26/12  2:23 AM      Result Value Range   Sodium 136  135 - 145 mEq/L   Potassium 3.4 (*) 3.5 - 5.1 mEq/L   Chloride 100  96 - 112 mEq/L   CO2 28  19 - 32 mEq/L   Glucose, Bld 177 (*) 70 - 99 mg/dL   BUN 18  6 - 23 mg/dL   Creatinine, Ser 1.61  0.50 - 1.35 mg/dL   Calcium 8.8  8.4 - 09.6 mg/dL   Total Protein 6.5  6.0 - 8.3 g/dL   Albumin 2.3 (*) 3.5 - 5.2 g/dL   AST 40 (*) 0 - 37 U/L   ALT 34  0 - 53 U/L   Alkaline Phosphatase 71  39 - 117 U/L   Total Bilirubin 0.9  0.3 - 1.2 mg/dL   GFR calc non Af Amer 77 (*) >90 mL/min   GFR calc Af Amer 89 (*) >90 mL/min   Comment:            The eGFR has been calculated     using the CKD EPI equation.     This calculation has not been     validated in all clinical     situations.     eGFR's persistently     <90 mL/min signify     possible Chronic Kidney Disease.  CBC     Status: Abnormal   Collection Time    11/26/12  2:23 AM      Result Value Range   WBC 10.9 (*) 4.0 -  10.5 K/uL   RBC 4.50  4.22 - 5.81 MIL/uL   Hemoglobin 13.8  13.0 - 17.0 g/dL   HCT 16.1  09.6 - 04.5 %   MCV 93.3  78.0 - 100.0 fL   MCH 30.7  26.0 - 34.0 pg   MCHC 32.9  30.0 - 36.0 g/dL   RDW 40.9  81.1 - 91.4 %   Platelets 166  150 - 400 K/uL  TROPONIN I     Status: None   Collection Time    11/26/12  8:42 AM      Result Value Range   Troponin I <0.30  <0.30 ng/mL   Comment:            Due to the release kinetics of cTnI,     a negative result within the first hours     of the onset of symptoms does  not rule out     myocardial infarction with certainty.     If myocardial infarction is still suspected,     repeat the test at appropriate intervals.    Studies/Results: Dg Chest Port 1 View  11/25/2012   *RADIOLOGY REPORT*  Clinical Data: Cough, fever  PORTABLE CHEST - 1 VIEW  Comparison: 10/20/2012  Findings: Previous coronary bypass changes noted.  Increased bibasilar interstitial opacities could represent chronic interstitial lung disease versus early developing basilar edema. Minimal streaky atelectasis also suspected in the lower lobes.  No effusion or pneumothorax.  Background COPD/emphysema suspected.  IMPRESSION: COPD/emphysema  Increased basilar interstitial changes versus developing edema.   Original Report Authenticated By: Judie Petit. Miles Costain, M.D.   Medications: Scheduled Meds: . antiseptic oral rinse  15 mL Mouth Rinse BID  . aspirin EC  81 mg Oral Daily  . atorvastatin  20 mg Oral Daily  . buPROPion  100 mg Oral BID  . ceFEPime (MAXIPIME) IV  1 g Intravenous Q8H  . citalopram  20 mg Oral QPM  . diltiazem  60 mg Oral Q6H  . enoxaparin (LOVENOX) injection  100 mg Subcutaneous Q12H  . metoprolol tartrate  25 mg Oral Q8H  . sodium chloride  3 mL Intravenous Q12H  . vancomycin  750 mg Intravenous Q12H   Continuous Infusions: . sodium chloride 1,000 mL (11/25/12 2205)   PRN Meds:.ondansetron (ZOFRAN) IV, ondansetron  Assessment/Plan: Principal Problem:   Sepsis - patient much improved. Blood cultures are pending. Respiratory culture growing a gram-positive rod. Urine culture is mixed which is likely chronic. Continue current antibiotics until bacterial cultures more specific Active Problems:   Rectourethral fistula   GERD (gastroesophageal reflux disease)   Depressive disorder, not elsewhere classified   Pure hypercholesterolemia   Malignant neoplasm of prostate   Myocardial infarction, history of   Aortic aneurysm   Hypertension - controlled   Left bundle branch block    Bladder cancer, history of   S/P CABG x 4   Acute respiratory failure - improving   COPD (chronic obstructive pulmonary disease) - symptomatically improving, no wheezes present   Acute diastolic CHF (congestive heart failure) - BNP elevated on admission. Symptomatically better on Lasix. 2-D echo pending.   Atrial fibrillation/atrial flutter - with [place] score greater than 2, we'll consider warfarin therapy. Currently on Lovenox    LOS: 1 day   Pearla Dubonnet, MD 11/26/2012, 9:11 PM

## 2012-11-26 NOTE — Progress Notes (Signed)
  Echocardiogram 2D Echocardiogram has been performed.  Zachary Haas, Zachary Haas 11/26/2012, 4:12 PM

## 2012-11-26 NOTE — Progress Notes (Signed)
ANTICOAGULATION CONSULT NOTE - Initial Consult  Pharmacy Consult for lovenox Indication: atrial fibrillation  Allergies  Allergen Reactions  . Percocet (Oxycodone-Acetaminophen) Anaphylaxis and Shortness Of Breath  . Ciprofloxacin Tinitus  . Influenza Vac Split (Flu Virus Vaccine) Other (See Comments)    Gets the flu  . Tape Other (See Comments)    Blisters skin---------------------USE PAPER TAPE  . Latex Rash    Patient Measurements: Height: 5\' 8"  (172.7 cm) Weight: 222 lb 0.1 oz (100.7 kg) IBW/kg (Calculated) : 68.4  Vital Signs: Temp: 98.2 F (36.8 C) (05/28 0824) Temp src: Oral (05/28 0824) BP: 143/78 mmHg (05/28 0948) Pulse Rate: 76 (05/28 0824)  Labs:  Recent Labs  11/25/12 1519 11/25/12 1815 11/26/12 0222 11/26/12 0223 11/26/12 0842  HGB 15.2  --   --  13.8  --   HCT 45.1  --   --  42.0  --   PLT 176  --   --  166  --   APTT 29  --   --   --   --   LABPROT 14.5  --   --   --   --   INR 1.15  --   --   --   --   CREATININE 0.91  --   --  0.91  --   TROPONINI  --  <0.30 <0.30  --  <0.30    Estimated Creatinine Clearance: 72 ml/min (by C-G formula based on Cr of 0.91).   Medical History: Past Medical History  Diagnosis Date  . Personal history of other diseases of circulatory system   . Other and unspecified coagulation defects   . Depressive disorder, not elsewhere classified   . Pure hypercholesterolemia   . Radiotherapy   . Rectal fistula   . Personal history of tobacco use, presenting hazards to health     1-1 1/2 ppd for 50 years  . Malignant neoplasm of bladder, part unspecified   . Other chronic cystitis   . Intestinovesical fistula   . Gross hematuria   . Malignant neoplasm of prostate   . Myocardial infarction 1992, 1998  . Aortic aneurysm 2008  . Hypertension   . Tendinitis   . Breathing problem     shallow/ chronic productive cough  . Bronchitis   . GERD (gastroesophageal reflux disease)   . Headache(784.0)     sinus  .  Coronary artery disease   . Left bundle branch block   . Prostate cancer   . Bladder cancer     Medications:  Prescriptions prior to admission  Medication Sig Dispense Refill  . aspirin 81 MG tablet Take 81 mg by mouth daily.      Marland Kitchen buPROPion (WELLBUTRIN) 100 MG tablet Take 100 mg by mouth 2 (two) times daily.       . citalopram (CELEXA) 20 MG tablet Take 20 mg by mouth every evening.       . Cranberry (SM CRANBERRY) 300 MG tablet Take 300 mg by mouth 3 (three) times daily.       Marland Kitchen dextromethorphan-guaiFENesin (MUCINEX DM) 30-600 MG per 12 hr tablet Take 2 tablets by mouth every 12 (twelve) hours.      . isosorbide mononitrate (IMDUR) 30 MG 24 hr tablet Take 30 mg by mouth daily before breakfast.       . metoprolol tartrate (LOPRESSOR) 25 MG tablet Take 25 mg by mouth daily.       . Multiple Vitamins-Minerals (MULTIVITAMIN WITH MINERALS) tablet Take 1 tablet by  mouth daily.      . psyllium (REGULOID) 0.52 G capsule Take 104 g by mouth 2 (two) times daily.      . simvastatin (ZOCOR) 40 MG tablet Take 40 mg by mouth daily before breakfast.        Scheduled:  . antiseptic oral rinse  15 mL Mouth Rinse BID  . aspirin EC  81 mg Oral Daily  . atorvastatin  20 mg Oral Daily  . buPROPion  100 mg Oral BID  . ceFEPime (MAXIPIME) IV  1 g Intravenous Q8H  . citalopram  20 mg Oral QPM  . diltiazem  60 mg Oral Q6H  . metoprolol tartrate  25 mg Oral Q8H  . sodium chloride  3 mL Intravenous Q12H  . vancomycin  750 mg Intravenous Q12H    Assessment: 79 yo with new onset afib who was admitted yesterday. Lovenox will be started for anticoagulation. Baseline CBC and scr ok.   Goal of Therapy:  Anti-Xa level 0.6-1.2 units/ml 4hrs after LMWH dose given Monitor platelets by anticoagulation protocol: Yes   Plan:  1. Lovenox 100mg  SQ q12 2. CBC q3days  Ulyses Southward [place] 11/26/2012,9:59 AM

## 2012-11-26 NOTE — Progress Notes (Signed)
UR COMPLETED  

## 2012-11-26 NOTE — Progress Notes (Addendum)
Patient Name: Zachary Haas Date of Encounter: 11/26/2012    SUBJECTIVE: Doing better this AM. No chest pain or CV complaints.  TELEMETRY:  A flutter with variable block. Filed Vitals:   11/26/12 0000 11/26/12 0041 11/26/12 0424 11/26/12 0642  BP: 112/64 111/46 130/62 112/65  Pulse: 95 91 85 87  Temp:  98 F (36.7 C) 97.9 F (36.6 C)   TempSrc:  Axillary Oral   Resp: 28 27 17    Height:      Weight:  100.7 kg (222 lb 0.1 oz)    SpO2: 94% 91% 92%     Intake/Output Summary (Last 24 hours) at 11/26/12 0758 Last data filed at 11/26/12 0700  Gross per 24 hour  Intake 493.25 ml  Output   1700 ml  Net -1206.75 ml    LABS: Basic Metabolic Panel:  Recent Labs  16/10/96 1519 11/26/12 0223  NA 134* 136  K 3.8 3.4*  CL 99 100  CO2 19 28  GLUCOSE 137* 177*  BUN 20 18  CREATININE 0.91 0.91  CALCIUM 8.8 8.8   CBC:  Recent Labs  11/25/12 1519 11/26/12 0223  WBC 16.2* 10.9*  NEUTROABS 13.1*  --   HGB 15.2 13.8  HCT 45.1 42.0  MCV 93.0 93.3  PLT 176 166   Cardiac Enzymes:  Recent Labs  11/25/12 1815 11/26/12 0222  TROPONINI <0.30 <0.30   BNP    Component Value Date/Time   PROBNP 2648.0* 11/25/2012 1815    Radiology/Studies:  Possible early CHF  Physical Exam: Blood pressure 112/65, pulse 87, temperature 97.9 F (36.6 C), temperature source Oral, resp. rate 17, height 5\' 8"  (1.727 m), weight 100.7 kg (222 lb 0.1 oz), SpO2 92.00%. Weight change:    Chest with rhonchi No rub but irregularly irregular rhythm  ASSESSMENT:  1. Atrial fib new onset of uncertain duration, CHADS2 >2 2. Acute diastolic heart failure (elevated BNP and normal EF 2011)  Plan:  1. Convert to PO diltiazem 2. Will need long term anticoagulation if no contraindication  Signed, Lesleigh Noe 11/26/2012, 7:58 AM

## 2012-11-27 ENCOUNTER — Inpatient Hospital Stay (HOSPITAL_COMMUNITY): Payer: Medicare Other

## 2012-11-27 DIAGNOSIS — E876 Hypokalemia: Secondary | ICD-10-CM | POA: Diagnosis not present

## 2012-11-27 LAB — CULTURE, RESPIRATORY W GRAM STAIN: Culture: NORMAL

## 2012-11-27 LAB — BASIC METABOLIC PANEL
Calcium: 8.7 mg/dL (ref 8.4–10.5)
Creatinine, Ser: 0.85 mg/dL (ref 0.50–1.35)
GFR calc Af Amer: >90 mL/min (ref >90)
GFR calc non Af Amer: 79 mL/min — ABNORMAL LOW (ref >90)

## 2012-11-27 LAB — CBC WITH DIFFERENTIAL/PLATELET
Basophils Absolute: 0.1 10*3/uL (ref 0.0–0.1)
Lymphocytes Relative: 21 % (ref 12–46)
Lymphs Abs: 2.4 10*3/uL (ref 0.7–4.0)
Monocytes Relative: 8 % (ref 3–12)
Platelets: 205 10*3/uL (ref 150–400)
RDW: 14.2 % (ref 11.5–15.5)
WBC: 11.4 10*3/uL — ABNORMAL HIGH (ref 4.0–10.5)

## 2012-11-27 MED ORDER — DILTIAZEM HCL ER COATED BEADS 240 MG PO CP24
240.0000 mg | ORAL_CAPSULE | Freq: Every day | ORAL | Status: DC
  Administered 2012-11-27 – 2012-12-01 (×5): 240 mg via ORAL
  Filled 2012-11-27 (×5): qty 1

## 2012-11-27 MED ORDER — POTASSIUM CHLORIDE CRYS ER 20 MEQ PO TBCR
20.0000 meq | EXTENDED_RELEASE_TABLET | Freq: Two times a day (BID) | ORAL | Status: DC
  Administered 2012-11-27 – 2012-12-01 (×9): 20 meq via ORAL
  Filled 2012-11-27 (×10): qty 1

## 2012-11-27 MED ORDER — POTASSIUM CHLORIDE CRYS ER 20 MEQ PO TBCR: 40.0000 meq | EXTENDED_RELEASE_TABLET | Freq: Once | ORAL | Status: DC

## 2012-11-27 MED ORDER — FLUTICASONE PROPIONATE HFA 44 MCG/ACT IN AERO
2.0000 | INHALATION_SPRAY | Freq: Two times a day (BID) | RESPIRATORY_TRACT | Status: DC
  Administered 2012-11-28 – 2012-12-01 (×7): 2 via RESPIRATORY_TRACT
  Filled 2012-11-27 (×2): qty 10.6

## 2012-11-27 MED ORDER — POTASSIUM CHLORIDE CRYS ER 20 MEQ PO TBCR: 20.0000 meq | EXTENDED_RELEASE_TABLET | Freq: Two times a day (BID) | ORAL | Status: DC

## 2012-11-27 MED ORDER — APIXABAN 5 MG PO TABS
5.0000 mg | ORAL_TABLET | Freq: Two times a day (BID) | ORAL | Status: DC
  Administered 2012-11-27 – 2012-12-01 (×9): 5 mg via ORAL
  Filled 2012-11-27 (×10): qty 1

## 2012-11-27 MED ORDER — FUROSEMIDE 10 MG/ML IJ SOLN: 60.0000 mg | Freq: Once | INTRAMUSCULAR | Status: AC

## 2012-11-27 MED ORDER — FUROSEMIDE 10 MG/ML IJ SOLN
INTRAMUSCULAR | Status: AC
  Administered 2012-11-27: 60 mg via INTRAVENOUS
  Filled 2012-11-27: qty 8

## 2012-11-27 NOTE — Progress Notes (Signed)
Patient Name: Zachary Haas Date of Encounter: 11/27/2012    SUBJECTIVE: Mildly dyspneic  TELEMETRY:  Atrial flutter with variable block: Filed Vitals:   11/27/12 0000 11/27/12 0003 11/27/12 0420 11/27/12 0746  BP: 111/58 111/58 102/46 100/68  Pulse: 69  71 64  Temp: 97.7 F (36.5 C)  97.9 F (36.6 C) 98 F (36.7 C)  TempSrc: Axillary  Oral Oral  Resp: 32  20 24  Height:      Weight: 103.6 kg (228 lb 6.3 oz)     SpO2: 93%  91% 92%    Intake/Output Summary (Last 24 hours) at 11/27/12 0907 Last data filed at 11/27/12 0600  Gross per 24 hour  Intake 1615.5 ml  Output    900 ml  Net  715.5 ml    LABS: Basic Metabolic Panel:  Recent Labs  16/10/96 0223 11/27/12 0440  NA 136 134*  K 3.4* 3.2*  CL 100 99  CO2 28 21  GLUCOSE 177* 105*  BUN 18 15  CREATININE 0.91 0.85  CALCIUM 8.8 8.7   CBC:  Recent Labs  11/25/12 1519 11/26/12 0223 11/27/12 0440  WBC 16.2* 10.9* 11.4*  NEUTROABS 13.1*  --  7.5  HGB 15.2 13.8 13.6  HCT 45.1 42.0 40.1  MCV 93.0 93.3 92.8  PLT 176 166 205   Cardiac Enzymes:  Recent Labs  11/25/12 1815 11/26/12 0222 11/26/12 0842  TROPONINI <0.30 <0.30 <0.30     Radiology/Studies:  No new data  Physical Exam: Blood pressure 100/68, pulse 64, temperature 98 F (36.7 C), temperature source Oral, resp. rate 24, height 5\' 8"  (1.727 m), weight 103.6 kg (228 lb 6.3 oz), SpO2 92.00%. Weight change: 3 kg (6 lb 9.8 oz)   Irregular rhythm.  Clear lungs anteriorly  ASSESSMENT:  1. Atrial flutter with variable block  2. Diastolic heart failure, improved  Plan:  1. Start oral anticoagulant, Eliquis  2. Converted long-acting diltiazem  Selinda Eon 11/27/2012, 9:07 AM

## 2012-11-27 NOTE — Evaluation (Signed)
Physical Therapy Evaluation Patient Details Name: Zachary Haas MRN: [id number] DOB: 01-25-1928 Today's Date: 11/27/2012 Time: 1324-4010 PT Time Calculation (min): 41 min  PT Assessment / Plan / Recommendation Clinical Impression    Pt admitted with acute respiratory failure, acute chf, and sepsis. Pt currently with functional limitations due to the deficits listed below (PT Problem List). Pt will benefit from skilled PT to increase their independence and safety with mobility to allow discharge home with wife.      PT Assessment  Patient needs continued PT services    Follow Up Recommendations  Home health PT    Does the patient have the potential to tolerate intense rehabilitation      Barriers to Discharge        Equipment Recommendations  None recommended by PT    Recommendations for Other Services     Frequency Min 3X/week    Precautions / Restrictions Precautions Precautions: Fall   Pertinent Vitals/Pain Dyspnea 3/4 with O2.  SaO2 89-90% on 2L.      Mobility  Bed Mobility Bed Mobility: Supine to Sit;Sitting - Scoot to Edge of Bed Supine to Sit: 4: Min guard;HOB elevated;With rails Sitting - Scoot to Edge of Bed: 5: Supervision Transfers Transfers: Sit to Stand;Stand to [employer] Transfers Sit to Stand: 4: Min assist;With upper extremity assist;From bed Stand to Sit: 4: Min assist;With upper extremity assist;With armrests Stand Pivot Transfers: 4: Min assist;With armrests Details for Transfer Assistance: Stand pivot with walker.  Pt very dyspneic with transfer. Ambulation/Gait Ambulation/Gait Assistance: Not tested (comment) (pt SOB with just transfer.)    Exercises     PT Diagnosis: Difficulty walking;Generalized weakness  PT Problem List: Decreased strength;Decreased activity tolerance;Decreased balance;Decreased mobility;Cardiopulmonary status limiting activity;Obesity PT Treatment Interventions: DME instruction;Gait training;Patient/family  education;Functional mobility training;Therapeutic activities;Therapeutic exercise;Balance training   PT Goals Acute Rehab PT Goals PT Goal Formulation: With patient Time For Goal Achievement: 12/04/12 Potential to Achieve Goals: Good Pt will go Supine/Side to Sit: with modified independence PT Goal: Supine/Side to Sit - Progress: Goal set today Pt will go Sit to Supine/Side: with modified independence PT Goal: Sit to Supine/Side - Progress: Goal set today Pt will go Sit to Stand: with modified independence PT Goal: Sit to Stand - Progress: Goal set today Pt will go Stand to Sit: with modified independence PT Goal: Stand to Sit - Progress: Goal set today Pt will Ambulate: 51 - 150 feet;with supervision;with least restrictive assistive device PT Goal: Ambulate - Progress: Goal set today  Visit Information  Last PT Received On: 11/27/12 Assistance Needed: +1    Subjective Data  Subjective: Pt's wife reports pt was doing pretty well getting around at home prior this illness. Patient Stated Goal: Return home   Prior Functioning  Home Living Lives With: Spouse Available Help at Discharge: Family;Available 24 hours/day Type of Home: House Home Access: Level entry Home Layout: One level Bathroom Shower/Tub: Engineer, manufacturing systems: Handicapped height Home Adaptive Equipment: Built-in shower seat;Grab bars around toilet;Grab bars in shower;Walker - rolling;Walker - four wheeled;Straight cane Prior Function Level of Independence: Independent with assistive device(s) Vocation: Retired Comments: Amb in house without assistive device most of time.  Amb with rollator or rolling walker when out. Communication Communication: No difficulties    Cognition  Cognition Arousal/Alertness: Awake/alert Behavior During Therapy: WFL for tasks assessed/performed Overall Cognitive Status: Within Functional Limits for tasks assessed    Extremity/Trunk Assessment Right Lower Extremity  Assessment RLE ROM/Strength/Tone: Deficits RLE ROM/Strength/Tone Deficits: grossly 4/5  Left Lower Extremity Assessment LLE ROM/Strength/Tone: Deficits LLE ROM/Strength/Tone Deficits: grossly 4/5   Balance Balance Balance Assessed: Yes Static Standing Balance Static Standing - Balance Support: Bilateral upper extremity supported Static Standing - Level of Assistance: 5: Stand by assistance  End of Session PT - End of Session Equipment Utilized During Treatment: Oxygen Activity Tolerance: Patient limited by fatigue Patient left: in chair;with call bell/phone within reach;with family/visitor present Nurse Communication: Mobility status  GP     Tiney Zipper 11/27/2012, 3:19 PM  [employer] PT [phone]

## 2012-11-27 NOTE — Progress Notes (Signed)
ANTICOAGULATION CONSULT NOTE - Initial Consult  Pharmacy Consult for apixaban Indication: atrial fibrillation  Allergies  Allergen Reactions  . Percocet (Oxycodone-Acetaminophen) Anaphylaxis and Shortness Of Breath  . Ciprofloxacin Tinitus  . Influenza Vac Split (Flu Virus Vaccine) Other (See Comments)    Gets the flu  . Tape Other (See Comments)    Blisters skin---------------------USE PAPER TAPE  . Latex Rash    Patient Measurements: Height: 5\' 8"  (172.7 cm) Weight: 228 lb 6.3 oz (103.6 kg) IBW/kg (Calculated) : 68.4  Vital Signs: Temp: 98 F (36.7 C) (05/29 0746) Temp src: Oral (05/29 0746) BP: 100/68 mmHg (05/29 0746) Pulse Rate: 64 (05/29 0746)  Labs:  Recent Labs  11/25/12 1519 11/25/12 1815 11/26/12 0222 11/26/12 0223 11/26/12 0842 11/27/12 0440  HGB 15.2  --   --  13.8  --  13.6  HCT 45.1  --   --  42.0  --  40.1  PLT 176  --   --  166  --  205  APTT 29  --   --   --   --   --   LABPROT 14.5  --   --   --   --   --   INR 1.15  --   --   --   --   --   CREATININE 0.91  --   --  0.91  --  0.85  TROPONINI  --  <0.30 <0.30  --  <0.30  --     Estimated Creatinine Clearance: 78.2 ml/min (by C-G formula based on Cr of 0.85).   Medical History: Past Medical History  Diagnosis Date  . Personal history of other diseases of circulatory system   . Other and unspecified coagulation defects   . Depressive disorder, not elsewhere classified   . Pure hypercholesterolemia   . Radiotherapy   . Rectal fistula   . Personal history of tobacco use, presenting hazards to health     1-1 1/2 ppd for 50 years  . Malignant neoplasm of bladder, part unspecified   . Other chronic cystitis   . Intestinovesical fistula   . Gross hematuria   . Malignant neoplasm of prostate   . Myocardial infarction 1992, 1998  . Aortic aneurysm 2008  . Hypertension   . Tendinitis   . Breathing problem     shallow/ chronic productive cough  . Bronchitis   . GERD (gastroesophageal  reflux disease)   . Headache(784.0)     sinus  . Coronary artery disease   . Left bundle branch block   . Prostate cancer   . Bladder cancer     Medications:  Prescriptions prior to admission  Medication Sig Dispense Refill  . aspirin 81 MG tablet Take 81 mg by mouth daily.      Marland Kitchen buPROPion (WELLBUTRIN) 100 MG tablet Take 100 mg by mouth 2 (two) times daily.       . citalopram (CELEXA) 20 MG tablet Take 20 mg by mouth every evening.       . Cranberry (SM CRANBERRY) 300 MG tablet Take 300 mg by mouth 3 (three) times daily.       Marland Kitchen dextromethorphan-guaiFENesin (MUCINEX DM) 30-600 MG per 12 hr tablet Take 2 tablets by mouth every 12 (twelve) hours.      . isosorbide mononitrate (IMDUR) 30 MG 24 hr tablet Take 30 mg by mouth daily before breakfast.       . metoprolol tartrate (LOPRESSOR) 25 MG tablet Take 25 mg by mouth  daily.       . Multiple Vitamins-Minerals (MULTIVITAMIN WITH MINERALS) tablet Take 1 tablet by mouth daily.      . psyllium (REGULOID) 0.52 G capsule Take 104 g by mouth 2 (two) times daily.      . simvastatin (ZOCOR) 40 MG tablet Take 40 mg by mouth daily before breakfast.        Scheduled:  . antiseptic oral rinse  15 mL Mouth Rinse BID  . apixaban  5 mg Oral BID  . aspirin EC  81 mg Oral Daily  . atorvastatin  20 mg Oral Daily  . buPROPion  100 mg Oral BID  . ceFEPime (MAXIPIME) IV  1 g Intravenous Q8H  . citalopram  20 mg Oral QPM  . diltiazem  240 mg Oral Daily  . fluticasone  2 puff Inhalation BID  . metoprolol tartrate  25 mg Oral Q8H  . potassium chloride  20 mEq Oral BID  . sodium chloride  3 mL Intravenous Q12H  . vancomycin  750 mg Intravenous Q12H    Assessment: 79 yo with new onset afib who was admitted yesterday. Transitioning to apixaban today. Bid dosing appropriate for weight and renal function.    Goal of Therapy:   Monitor platelets by anticoagulation protocol: Yes   Plan:   Apixaban 5mg  PO BID

## 2012-11-27 NOTE — Progress Notes (Signed)
Subjective: Zachary Haas is feeling much better today.  Cardiac rhythm appears to be in possible sinus but may be very irregular with atrial fibrillation/flutter.  Urine is growing a mixed culture as expected because of suprapubic tube.  Sputum has a gram-positive rod which is still being speciated.  Blood cultures are negative thus far. Can likely transfer to this telemetry bed today and get out of bed to chair and start physical therapy.  Echocardiogram reveals ejection fraction of 60-65% with no wall thickening but apparently has septal motion abnormalities  Objective: Weight change: 3 kg (6 lb 9.8 oz)  Intake/Output Summary (Last 24 hours) at 11/27/12 0747 Last data filed at 11/27/12 0600  Gross per 24 hour  Intake 1670.5 ml  Output    900 ml  Net  770.5 ml   Filed Vitals:   11/26/12 2027 11/27/12 0000 11/27/12 0003 11/27/12 0420  BP: 108/67 111/58 111/58 102/46  Pulse: 89 69  71  Temp: 98 F (36.7 C) 97.7 F (36.5 C)  97.9 F (36.6 C)  TempSrc: Oral Axillary  Oral  Resp: 23 32  20  Height:      Weight:  103.6 kg (228 lb 6.3 oz)    SpO2: 92% 93%  91%   General Appearance: Alert, cooperative, no distress, appears stated age  Lungs: Clear to auscultation bilaterally but rhonchi with cough  Heart: Irregular rhythm, S1 and S2 normal, no murmur, rub or gallop. Rate controlled  Abdomen: Soft, non-tender, bowel sounds active all four quadrants, no masses, no organomegaly. Patient has colostomy as well as suprapubic tube and urine is slightly cloudy  Extremities: Extremities normal, atraumatic, no cyanosis or edema  Neuro: Oriented x2, to name and place, nonfocal exam  Lab Results: Results for orders placed during the hospital encounter of 11/25/12 (from the past 48 hour(s))  CULTURE, BLOOD (ROUTINE X 2)     Status: None   Collection Time    11/25/12  3:00 PM      Result Value Range   Specimen Description BLOOD RIGHT ARM     Special Requests BOTTLES DRAWN AEROBIC AND ANAEROBIC 10CC      Culture  Setup Time 11/25/2012 20:35     Culture       Value:        BLOOD CULTURE RECEIVED NO GROWTH TO DATE CULTURE WILL BE HELD FOR 5 DAYS BEFORE ISSUING A FINAL NEGATIVE REPORT   Report Status PENDING    CULTURE, BLOOD (ROUTINE X 2)     Status: None   Collection Time    11/25/12  3:15 PM      Result Value Range   Specimen Description BLOOD RIGHT HAND     Special Requests       Value: BOTTLES DRAWN AEROBIC AND ANAEROBIC 10CC BLUE 7CC RED   Culture  Setup Time 11/25/2012 20:36     Culture       Value:        BLOOD CULTURE RECEIVED NO GROWTH TO DATE CULTURE WILL BE HELD FOR 5 DAYS BEFORE ISSUING A FINAL NEGATIVE REPORT   Report Status PENDING    CBC WITH DIFFERENTIAL     Status: Abnormal   Collection Time    11/25/12  3:19 PM      Result Value Range   WBC 16.2 (*) 4.0 - 10.5 K/uL   RBC 4.85  4.22 - 5.81 MIL/uL   Hemoglobin 15.2  13.0 - 17.0 g/dL   HCT 16.1  09.6 - 04.5 %  MCV 93.0  78.0 - 100.0 fL   MCH 31.3  26.0 - 34.0 pg   MCHC 33.7  30.0 - 36.0 g/dL   RDW 11.9  14.7 - 82.9 %   Platelets 176  150 - 400 K/uL   Neutrophils Relative % 81 (*) 43 - 77 %   Lymphocytes Relative 10 (*) 12 - 46 %   Monocytes Relative 9  3 - 12 %   Eosinophils Relative 0  0 - 5 %   Basophils Relative 0  0 - 1 %   Neutro Abs 13.1 (*) 1.7 - 7.7 K/uL   Lymphs Abs 1.6  0.7 - 4.0 K/uL   Monocytes Absolute 1.5 (*) 0.1 - 1.0 K/uL   Eosinophils Absolute 0.0  0.0 - 0.7 K/uL   Basophils Absolute 0.0  0.0 - 0.1 K/uL   WBC Morphology TOXIC GRANULATION     Comment: FEW ATYPICAL LYMPHS NOTED  COMPREHENSIVE METABOLIC PANEL     Status: Abnormal   Collection Time    11/25/12  3:19 PM      Result Value Range   Sodium 134 (*) 135 - 145 mEq/L   Potassium 3.8  3.5 - 5.1 mEq/L   Chloride 99  96 - 112 mEq/L   CO2 19  19 - 32 mEq/L   Glucose, Bld 137 (*) 70 - 99 mg/dL   BUN 20  6 - 23 mg/dL   Creatinine, Ser 5.62  0.50 - 1.35 mg/dL   Calcium 8.8  8.4 - 13.0 mg/dL   Total Protein 7.4  6.0 - 8.3 g/dL    Albumin 2.5 (*) 3.5 - 5.2 g/dL   AST 48 (*) 0 - 37 U/L   ALT 35  0 - 53 U/L   Alkaline Phosphatase 79  39 - 117 U/L   Total Bilirubin 1.2  0.3 - 1.2 mg/dL   GFR calc non Af Amer 77 (*) >90 mL/min   GFR calc Af Amer 89 (*) >90 mL/min   Comment:            The eGFR has been calculated     using the CKD EPI equation.     This calculation has not been     validated in all clinical     situations.     eGFR's persistently     <90 mL/min signify     possible Chronic Kidney Disease.  PROCALCITONIN     Status: None   Collection Time    11/25/12  3:19 PM      Result Value Range   Procalcitonin 0.10     Comment:            Interpretation:     PCT (Procalcitonin) <= 0.5 ng/mL:     Systemic infection (sepsis) is not likely.     Local bacterial infection is possible.     (NOTE)             ICU PCT Algorithm               Non ICU PCT Algorithm        ----------------------------     ------------------------------             PCT < 0.25 ng/mL                 PCT < 0.1 ng/mL         Stopping of antibiotics            Stopping of antibiotics  strongly encouraged.               strongly encouraged.        ----------------------------     ------------------------------           PCT level decrease by               PCT < 0.25 ng/mL           >= 80% from peak PCT           OR PCT 0.25 - 0.5 ng/mL          Stopping of antibiotics                                                 encouraged.         Stopping of antibiotics               encouraged.        ----------------------------     ------------------------------           PCT level decrease by              PCT >= 0.25 ng/mL           < 80% from peak PCT            AND PCT >= 0.5 ng/mL            Continuing antibiotics                                                  encouraged.           Continuing antibiotics                encouraged.        ----------------------------     ------------------------------         PCT level  increase compared          PCT > 0.5 ng/mL             with peak PCT AND              PCT >= 0.5 ng/mL             Escalation of antibiotics                                              strongly encouraged.          Escalation of antibiotics            strongly encouraged.  APTT     Status: None   Collection Time    11/25/12  3:19 PM      Result Value Range   aPTT 29  24 - 37 seconds  PROTIME-INR     Status: None   Collection Time    11/25/12  3:19 PM      Result Value Range   Prothrombin Time 14.5  11.6 - 15.2 seconds   INR 1.15  0.00 - 1.49  CG4 I-STAT (LACTIC ACID)     Status: Abnormal  Collection Time    11/25/12  3:54 PM      Result Value Range   Lactic Acid, Venous 2.74 (*) 0.5 - 2.2 mmol/L  CULTURE, EXPECTORATED SPUTUM-ASSESSMENT     Status: None   Collection Time    11/25/12  3:56 PM      Result Value Range   Specimen Description SPUTUM     Special Requests Normal     Sputum evaluation       Value: THIS SPECIMEN IS ACCEPTABLE. RESPIRATORY CULTURE REPORT TO FOLLOW.   Report Status 11/25/2012 FINAL    CULTURE, RESPIRATORY (NON-EXPECTORATED)     Status: None   Collection Time    11/25/12  3:56 PM      Result Value Range   Specimen Description SPUTUM     Special Requests NONE     Gram Stain       Value: ABUNDANT WBC PRESENT, PREDOMINANTLY PMN     NO SQUAMOUS EPITHELIAL CELLS SEEN     MODERATE GRAM POSITIVE RODS   Culture PENDING     Report Status PENDING    URINALYSIS, ROUTINE W REFLEX MICROSCOPIC     Status: Abnormal   Collection Time    11/25/12  4:00 PM      Result Value Range   Color, Urine AMBER (*) YELLOW   Comment: BIOCHEMICALS MAY BE AFFECTED BY COLOR   APPearance CLOUDY (*) CLEAR   Specific Gravity, Urine 1.024  1.005 - 1.030   pH 5.5  5.0 - 8.0   Glucose, UA NEGATIVE  NEGATIVE mg/dL   Hgb urine dipstick LARGE (*) NEGATIVE   Bilirubin Urine SMALL (*) NEGATIVE   Ketones, ur NEGATIVE  NEGATIVE mg/dL   Protein, ur 161 (*) NEGATIVE mg/dL    Urobilinogen, UA 1.0  0.0 - 1.0 mg/dL   Nitrite NEGATIVE  NEGATIVE   Leukocytes, UA LARGE (*) NEGATIVE  POCT I-STAT 3, BLOOD GAS (G3+)     Status: Abnormal   Collection Time    11/25/12  4:00 PM      Result Value Range   pH, Arterial 7.372  7.350 - 7.450   pCO2 arterial 37.5  35.0 - 45.0 mmHg   pO2, Arterial 138.0 (*) 80.0 - 100.0 mmHg   Bicarbonate 21.3  20.0 - 24.0 mEq/L   TCO2 22  0 - 100 mmol/L   O2 Saturation 99.0     Acid-base deficit 3.0 (*) 0.0 - 2.0 mmol/L   Patient temperature 102.3 F     Collection site RADIAL, ALLEN'S TEST ACCEPTABLE     Drawn by Operator     Sample type ARTERIAL    URINE MICROSCOPIC-ADD ON     Status: Abnormal   Collection Time    11/25/12  4:00 PM      Result Value Range   Squamous Epithelial / LPF RARE  RARE   WBC, UA 21-50  <3 WBC/hpf   RBC / HPF 0-2  <3 RBC/hpf   Bacteria, UA MANY (*) RARE   Urine-Other MUCOUS PRESENT    URINE CULTURE     Status: None   Collection Time    11/25/12  4:01 PM      Result Value Range   Specimen Description URINE, CATHETERIZED     Special Requests NONE     Culture  Setup Time 11/25/2012 15:25     Colony Count >=100,000 COLONIES/ML     Culture       Value: Multiple bacterial morphotypes present, none predominant. Suggest appropriate recollection if  clinically indicated.   Report Status 11/26/2012 FINAL    PRO B NATRIURETIC PEPTIDE     Status: Abnormal   Collection Time    11/25/12  6:15 PM      Result Value Range   Pro B Natriuretic peptide (BNP) 2648.0 (*) 0 - 450 pg/mL  TROPONIN I     Status: None   Collection Time    11/25/12  6:15 PM      Result Value Range   Troponin I <0.30  <0.30 ng/mL   Comment:            Due to the release kinetics of cTnI,     a negative result within the first hours     of the onset of symptoms does not rule out     myocardial infarction with certainty.     If myocardial infarction is still suspected,     repeat the test at appropriate intervals.  MRSA PCR SCREENING      Status: None   Collection Time    11/25/12  7:04 PM      Result Value Range   MRSA by PCR NEGATIVE  NEGATIVE   Comment:            The GeneXpert MRSA Assay (FDA     approved for NASAL specimens     only), is one component of a     comprehensive MRSA colonization     surveillance program. It is not     intended to diagnose MRSA     infection nor to guide or     monitor treatment for     MRSA infections.  TROPONIN I     Status: None   Collection Time    11/26/12  2:22 AM      Result Value Range   Troponin I <0.30  <0.30 ng/mL   Comment:            Due to the release kinetics of cTnI,     a negative result within the first hours     of the onset of symptoms does not rule out     myocardial infarction with certainty.     If myocardial infarction is still suspected,     repeat the test at appropriate intervals.  COMPREHENSIVE METABOLIC PANEL     Status: Abnormal   Collection Time    11/26/12  2:23 AM      Result Value Range   Sodium 136  135 - 145 mEq/L   Potassium 3.4 (*) 3.5 - 5.1 mEq/L   Chloride 100  96 - 112 mEq/L   CO2 28  19 - 32 mEq/L   Glucose, Bld 177 (*) 70 - 99 mg/dL   BUN 18  6 - 23 mg/dL   Creatinine, Ser 9.60  0.50 - 1.35 mg/dL   Calcium 8.8  8.4 - 45.4 mg/dL   Total Protein 6.5  6.0 - 8.3 g/dL   Albumin 2.3 (*) 3.5 - 5.2 g/dL   AST 40 (*) 0 - 37 U/L   ALT 34  0 - 53 U/L   Alkaline Phosphatase 71  39 - 117 U/L   Total Bilirubin 0.9  0.3 - 1.2 mg/dL   GFR calc non Af Amer 77 (*) >90 mL/min   GFR calc Af Amer 89 (*) >90 mL/min   Comment:            The eGFR has been calculated     using the CKD EPI  equation.     This calculation has not been     validated in all clinical     situations.     eGFR's persistently     <90 mL/min signify     possible Chronic Kidney Disease.  CBC     Status: Abnormal   Collection Time    11/26/12  2:23 AM      Result Value Range   WBC 10.9 (*) 4.0 - 10.5 K/uL   RBC 4.50  4.22 - 5.81 MIL/uL   Hemoglobin 13.8  13.0 - 17.0  g/dL   HCT 16.1  09.6 - 04.5 %   MCV 93.3  78.0 - 100.0 fL   MCH 30.7  26.0 - 34.0 pg   MCHC 32.9  30.0 - 36.0 g/dL   RDW 40.9  81.1 - 91.4 %   Platelets 166  150 - 400 K/uL  TROPONIN I     Status: None   Collection Time    11/26/12  8:42 AM      Result Value Range   Troponin I <0.30  <0.30 ng/mL   Comment:            Due to the release kinetics of cTnI,     a negative result within the first hours     of the onset of symptoms does not rule out     myocardial infarction with certainty.     If myocardial infarction is still suspected,     repeat the test at appropriate intervals.  BASIC METABOLIC PANEL     Status: Abnormal   Collection Time    11/27/12  4:40 AM      Result Value Range   Sodium 134 (*) 135 - 145 mEq/L   Potassium 3.2 (*) 3.5 - 5.1 mEq/L   Chloride 99  96 - 112 mEq/L   CO2 21  19 - 32 mEq/L   Glucose, Bld 105 (*) 70 - 99 mg/dL   BUN 15  6 - 23 mg/dL   Creatinine, Ser 7.82  0.50 - 1.35 mg/dL   Calcium 8.7  8.4 - 95.6 mg/dL   GFR calc non Af Amer 79 (*) >90 mL/min   GFR calc Af Amer >90  >90 mL/min   Comment:            The eGFR has been calculated     using the CKD EPI equation.     This calculation has not been     validated in all clinical     situations.     eGFR's persistently     <90 mL/min signify     possible Chronic Kidney Disease.  CBC WITH DIFFERENTIAL     Status: Abnormal   Collection Time    11/27/12  4:40 AM      Result Value Range   WBC 11.4 (*) 4.0 - 10.5 K/uL   RBC 4.32  4.22 - 5.81 MIL/uL   Hemoglobin 13.6  13.0 - 17.0 g/dL   HCT 21.3  08.6 - 57.8 %   MCV 92.8  78.0 - 100.0 fL   MCH 31.5  26.0 - 34.0 pg   MCHC 33.9  30.0 - 36.0 g/dL   RDW 46.9  62.9 - 52.8 %   Platelets 205  150 - 400 K/uL   Neutrophils Relative % 66  43 - 77 %   Lymphocytes Relative 21  12 - 46 %   Monocytes Relative 8  3 - 12 %  Eosinophils Relative 4  0 - 5 %   Basophils Relative 1  0 - 1 %   Neutro Abs 7.5  1.7 - 7.7 K/uL   Lymphs Abs 2.4  0.7 - 4.0 K/uL    Monocytes Absolute 0.9  0.1 - 1.0 K/uL   Eosinophils Absolute 0.5  0.0 - 0.7 K/uL   Basophils Absolute 0.1  0.0 - 0.1 K/uL   WBC Morphology ATYPICAL LYMPHOCYTES     Comment: TOXIC GRANULATION  PRO B NATRIURETIC PEPTIDE     Status: Abnormal   Collection Time    11/27/12  4:40 AM      Result Value Range   Pro B Natriuretic peptide (BNP) 3110.0 (*) 0 - 450 pg/mL    Studies/Results: Dg Chest Port 1 View  11/25/2012   *RADIOLOGY REPORT*  Clinical Data: Cough, fever  PORTABLE CHEST - 1 VIEW  Comparison: 10/20/2012  Findings: Previous coronary bypass changes noted.  Increased bibasilar interstitial opacities could represent chronic interstitial lung disease versus early developing basilar edema. Minimal streaky atelectasis also suspected in the lower lobes.  No effusion or pneumothorax.  Background COPD/emphysema suspected.  IMPRESSION: COPD/emphysema  Increased basilar interstitial changes versus developing edema.   Original Report Authenticated By: Judie Petit. Miles Costain, M.D.   Medications: Scheduled Meds: . antiseptic oral rinse  15 mL Mouth Rinse BID  . aspirin EC  81 mg Oral Daily  . atorvastatin  20 mg Oral Daily  . buPROPion  100 mg Oral BID  . ceFEPime (MAXIPIME) IV  1 g Intravenous Q8H  . citalopram  20 mg Oral QPM  . diltiazem  60 mg Oral Q6H  . enoxaparin (LOVENOX) injection  100 mg Subcutaneous Q12H  . metoprolol tartrate  25 mg Oral Q8H  . potassium chloride  20 mEq Oral BID  . sodium chloride  3 mL Intravenous Q12H  . vancomycin  750 mg Intravenous Q12H   Continuous Infusions: . sodium chloride 1,000 mL (11/25/12 2205)   PRN Meds:.ondansetron (ZOFRAN) IV, ondansetron  Assessment/Plan: Principal Problem:  Sepsis - patient much improved. Blood cultures are negative as far. Respiratory culture growing a gram-positive rod, still to be speciated sensitivities. Urine culture is mixed which is likely chronic. Continue current antibiotics until bacterial cultures more specific  Active  Problems:  Rectourethral fistula - history of GERD (gastroesophageal reflux disease) - asymptomatic  Depressive disorder, not elsewhere classified  Pure hypercholesterolemia  Malignant neoplasm of prostate - history of  Myocardial infarction, history of  Aortic aneurysm  Hypertension - controlled  Left bundle branch block  Bladder cancer, history of  S/P CABG x 4  Acute respiratory failure - improving - likely with bronchitis and mild COPD exacerbation - currently wheezing COPD (chronic obstructive pulmonary disease) - symptomatically improving, no wheezes present - will start Pulmicort, 2 inhalations twice a day  Acute diastolic CHF (congestive heart failure) - BNP actually higher today than on admission. Symptomatically better on Lasix. 2-D echo with normal EF and may have had cardiac dysfunction secondary to sepsis.  Also worry about coronary artery disease contributing to his current myocardial dysfunction  Atrial fibrillation/atrial flutter - with [place] score greater than 2, we'll consider warfarin therapy. Currently on Lovenox.  Start warfarin therapy or other anticoagulant per cardiology recommendation Disposition - transfer to telemetry and start physical therapy and get patient out of bed    LOS: 2 days   Pearla Dubonnet, MD 11/27/2012, 7:47 AM

## 2012-11-27 NOTE — Progress Notes (Signed)
Patient is able to ambulate with minimal assist from bed to chair.  Shortness of breath noted.  O2 per  ongoing.  No pain verbalized.

## 2012-11-28 LAB — CBC WITH DIFFERENTIAL/PLATELET
Basophils Relative: 1 % (ref 0–1)
Eosinophils Absolute: 0.5 10*3/uL (ref 0.0–0.7)
Hemoglobin: 14 g/dL (ref 13.0–17.0)
Lymphs Abs: 2.3 10*3/uL (ref 0.7–4.0)
MCH: 31 pg (ref 26.0–34.0)
MCHC: 33.2 g/dL (ref 30.0–36.0)
MCV: 93.4 fL (ref 78.0–100.0)
Monocytes Absolute: 0.8 10*3/uL (ref 0.1–1.0)
Neutro Abs: 7.9 10*3/uL — ABNORMAL HIGH (ref 1.7–7.7)
Neutrophils Relative %: 68 % (ref 43–77)

## 2012-11-28 LAB — BASIC METABOLIC PANEL
BUN: 12 mg/dL (ref 6–23)
GFR calc non Af Amer: 79 mL/min — ABNORMAL LOW (ref >90)
Glucose, Bld: 145 mg/dL — ABNORMAL HIGH (ref 70–99)
Potassium: 4 mEq/L (ref 3.5–5.1)

## 2012-11-28 MED ORDER — FUROSEMIDE 10 MG/ML IJ SOLN
80.0000 mg | Freq: Two times a day (BID) | INTRAMUSCULAR | Status: AC
  Administered 2012-11-28 – 2012-11-29 (×3): 80 mg via INTRAVENOUS
  Filled 2012-11-28 (×3): qty 8

## 2012-11-28 MED ORDER — AZITHROMYCIN 250 MG PO TABS
250.0000 mg | ORAL_TABLET | Freq: Every day | ORAL | Status: DC
  Administered 2012-11-28 – 2012-12-01 (×4): 250 mg via ORAL
  Filled 2012-11-28 (×5): qty 1

## 2012-11-28 MED ORDER — CEFUROXIME AXETIL 500 MG PO TABS
500.0000 mg | ORAL_TABLET | Freq: Two times a day (BID) | ORAL | Status: DC
  Administered 2012-11-28 – 2012-12-01 (×7): 500 mg via ORAL
  Filled 2012-11-28 (×11): qty 1

## 2012-11-28 MED ORDER — FUROSEMIDE 40 MG PO TABS
40.0000 mg | ORAL_TABLET | Freq: Every day | ORAL | Status: DC
  Administered 2012-11-28: 40 mg via ORAL
  Filled 2012-11-28: qty 1

## 2012-11-28 NOTE — Clinical Documentation Improvement (Signed)
GENERIC DOCUMENTATION CLARIFICATION QUERY  THIS DOCUMENT IS NOT A PERMANENT PART OF THE MEDICAL RECORD  TO RESPOND TO THE THIS QUERY, FOLLOW THE INSTRUCTIONS BELOW:  1. If needed, update documentation for the patient's encounter via the notes activity.  2. Access this query again and click edit on the In [employer].  3. After updating, or not, click F2 to complete all highlighted (required) fields concerning your review. Select "additional documentation in the medical record" OR "no additional documentation provided".  4. Click Sign note button.  5. The deficiency will fall out of your In Basket *Please let us know if you are not able to complete this workflow by phone or e-mail (listed below).  Please update your documentation within the medical record to reflect your response to this query.                                                                                        11/28/12   Dear Dr. Kevan Ny / Associates,  In a better effort to capture your patient's severity of illness, reflect appropriate length of stay and utilization of resources, a review of the patient medical record has revealed the following indicators.    Based on your clinical judgment, please clarify and document in a progress note and/or discharge summary the clinical condition associated with the following supporting information:  In responding to this query please exercise your independent judgment.  The fact that a query is asked, does not imply that any particular answer is desired or expected.   Possible Clinical Conditions?  _______UTI related to catheter  ____X___Sepsis related to UTI   _______Other Condition  _______Cannot Clinically Determine     Supporting Information:  Risk Factors: Per 5/27 H&P, Sepsis is either due to urinary or pulmonary source. Patient with a history of suprapubic catheter. 5/27 labs include abnormal U/A and abnormal urine micro.    You may use possible,  probable, or suspect with inpatient documentation. possible, probable, suspected diagnoses MUST be documented at the time of discharge  Reviewed: Sepsis related to UTI is the most probable diagnosis  Thank You,  Burton Apley,  Clinical Documentation Specialist:  Phone: [phone]  Health Information Management [place]

## 2012-11-28 NOTE — Progress Notes (Addendum)
Patient Name: Zachary Haas Date of Encounter: 11/28/2012    SUBJECTIVE: The patient has no cardiac complaints and denies palpitations. Still has cough and some mild dyspnea..  TELEMETRY:  Atrial flutter with moderate rate control. Filed Vitals:   11/27/12 1440 11/27/12 2012 11/28/12 0500 11/28/12 0800  BP:  101/66 102/59 102/85  Pulse:  74 71 79  Temp:  98.3 F (36.8 C) 98.2 F (36.8 C) 98.6 F (37 C)  TempSrc:  Oral Oral Oral  Resp:  20 20 20   Height:      Weight:      SpO2: 90% 90% 90% 90%    Intake/Output Summary (Last 24 hours) at 11/28/12 1152 Last data filed at 11/28/12 1030  Gross per 24 hour  Intake    503 ml  Output   1900 ml  Net  -1397 ml    LABS: Basic Metabolic Panel:  Recent Labs  25/36/64 0440 11/28/12 0520  NA 134* 138  K 3.2* 4.0  CL 99 100  CO2 21 29  GLUCOSE 105* 145*  BUN 15 12  CREATININE 0.85 0.84  CALCIUM 8.7 9.2   CBC:  Recent Labs  11/27/12 0440 11/28/12 0520  WBC 11.4* 11.6*  NEUTROABS 7.5 7.9*  HGB 13.6 14.0  HCT 40.1 42.2  MCV 92.8 93.4  PLT 205 226   Cardiac Enzymes:  Recent Labs  11/25/12 1815 11/26/12 0222 11/26/12 0842  TROPONINI <0.30 <0.30 <0.30   BNP (last 3 results)  Recent Labs  11/25/12 1815 11/27/12 0440  PROBNP 2648.0* 3110.0*   ECHOCARDIOGRAM:  Study Conclusions  - Left ventricle: The cavity size was normal. Systolic function was normal. The estimated ejection fraction was in the range of 60% to 65%. Wall motion was normal; there were no regional wall motion abnormalities. - Ventricular septum: Septal motion showed abnormal function, dyssynergy, and paradox. - Mitral valve: Calcified annulus. Mildly thickened leaflets . - Left atrium: The atrium was mildly dilated. - Right ventricle: The cavity size was moderately dilated. Wall thickness was normal. - Right atrium: The atrium was mildly to moderately dilated.   Radiology/Studies:  CXR 11/27/12, worsening vascular  congestion. *RADIOLOGY REPORT*  Clinical Data: Productive cough, slight shortness of breath  PORTABLE CHEST - 1 VIEW  Comparison: Chest x-ray of 11/25/2012 and  Findings: There is more indistinctness of perihilar vasculature  most consistent with slight worsening of pulmonary vascular  congestion. No definite effusion is seen. Mild cardiomegaly is  stable. Median sternotomy sutures are noted from prior CABG.  IMPRESSION:  Slight worsening of probable pulmonary vascular congestion.  Original Report Authenticated By: Dwyane Dee, M.D.      Physical Exam: Blood pressure 102/85, pulse 79, temperature 98.6 F (37 C), temperature source Oral, resp. rate 20, height 5\' 8"  (1.727 m), weight 103.6 kg (228 lb 6.3 oz), SpO2 90.00%. Weight change:    Decreased breath sounds at the bases. Irregular rhythm. No murmur No peripheral or presacral edema  ASSESSMENT:  1. Atrial flutter continues 2. Acute diastolic heart failure, likely due to A. Flutter. 3. CAD without evidence of ischemia or MI   Plan:  1. More aggressive diuresis 2. May require rhythm control  Via cardioversion and antiarrhythmic therapy if unable to control CHF.  Selinda Eon 11/28/2012, 11:52 AM

## 2012-11-28 NOTE — Progress Notes (Signed)
Physical Therapy Treatment Patient Details Name: Zachary Haas MRN: [id number] DOB: Oct 16, 1927 Today's Date: 11/28/2012 Time: 1191-4782 PT Time Calculation (min): 23 min  PT Assessment / Plan / Recommendation Comments on Treatment Session  Patient demonstrates decreased activity tolerance with ambulation with O2 dropping to 81% on 2 liters. Patient toelrated ther-ex well, similar program to what patient performs with HHPT. Will continue to see and progress activity as tolerated in preparation for dc home with HHPT.    Follow Up Recommendations  Home health PT           Equipment Recommendations  None recommended by PT    Recommendations for Other Services    Frequency Min 3X/week   Plan Discharge plan remains appropriate    Precautions / Restrictions Precautions Precautions: Fall Restrictions Weight Bearing Restrictions: No   Pertinent Vitals/Pain SpO2 at rest 93% on 2 liters, ambulating dropped to 81% HR in 100s    Mobility  Transfers Transfers: Sit to Stand;Stand to Sit Sit to Stand: 4: Min guard Stand to Sit: 4: Min guard Details for Transfer Assistance: VCs for hand placement Ambulation/Gait Ambulation/Gait Assistance: 4: Min guard (pt SOB with just transfer.) Ambulation Distance (Feet): 110 Feet Assistive device: Rolling walker Ambulation/Gait Assistance Details: VCs for proper position in rw. Pt very dyspnic with ambulation, O2 monitored 93% on 2 liters at rest; droped to 81% with 60 ft ambulation, seated rest break on 2 liters, increased to 3 liters and rebounded to 90% with extended rest, continued ambulation and became dyspnic again spo2 dropped low 80s.  Gait Pattern: Step-through pattern;Trunk flexed;Narrow base of support Gait velocity: slightly decreased General Gait Details: steady but very dyspnic with increased WOB Stairs: No    Exercises General Exercises - Lower Extremity Ankle Circles/Pumps: AROM;Both;10 reps Quad Sets: AROM;Both;10 reps Long Arc  Quad: AROM;Both;10 reps Hip ABduction/ADduction: AROM;Both;10 reps Hip Flexion/Marching: AROM;Both;10 reps Heel Raises: AROM;Both;10 reps   PT Diagnosis:    PT Problem List:   PT Treatment Interventions:     PT Goals Acute Rehab PT Goals PT Goal Formulation: With patient Time For Goal Achievement: 12/04/12 Potential to Achieve Goals: Good Pt will go Supine/Side to Sit: with modified independence PT Goal: Supine/Side to Sit - Progress: Progressing toward goal Pt will go Sit to Supine/Side: with modified independence PT Goal: Sit to Supine/Side - Progress: Progressing toward goal Pt will go Sit to Stand: with modified independence PT Goal: Sit to Stand - Progress: Progressing toward goal Pt will go Stand to Sit: with modified independence PT Goal: Stand to Sit - Progress: Progressing toward goal Pt will Ambulate: 51 - 150 feet;with supervision;with least restrictive assistive device PT Goal: Ambulate - Progress: Progressing toward goal  Visit Information  Last PT Received On: 11/28/12 Assistance Needed: +1    Subjective Data  Subjective: It takes a bit for me to puff up my lungs Patient Stated Goal: Return home   Cognition  Cognition Arousal/Alertness: Awake/alert Behavior During Therapy: WFL for tasks assessed/performed Overall Cognitive Status: Within Functional Limits for tasks assessed    Balance  Balance Balance Assessed: Yes Static Standing Balance Static Standing - Balance Support: Bilateral upper extremity supported Static Standing - Level of Assistance: 5: Stand by assistance  End of Session PT - End of Session Equipment Utilized During Treatment: Gait belt;Oxygen Activity Tolerance: Patient limited by fatigue Patient left: in chair;with call bell/phone within reach;with family/visitor present Nurse Communication: Mobility status   GP     Fabio Asa 11/28/2012, 2:51 PM [facility]  Barb Merino, [place] DPT  [phone]

## 2012-11-29 DIAGNOSIS — N390 Urinary tract infection, site not specified: Secondary | ICD-10-CM | POA: Diagnosis present

## 2012-11-29 DIAGNOSIS — I4819 Other persistent atrial fibrillation: Secondary | ICD-10-CM | POA: Diagnosis present

## 2012-11-29 LAB — BASIC METABOLIC PANEL
BUN: 13 mg/dL (ref 6–23)
Calcium: 9.2 mg/dL (ref 8.4–10.5)
Creatinine, Ser: 0.87 mg/dL (ref 0.50–1.35)
GFR calc non Af Amer: 78 mL/min — ABNORMAL LOW (ref >90)
Glucose, Bld: 138 mg/dL — ABNORMAL HIGH (ref 70–99)

## 2012-11-29 MED ORDER — FUROSEMIDE 80 MG PO TABS
80.0000 mg | ORAL_TABLET | Freq: Two times a day (BID) | ORAL | Status: DC
  Administered 2012-11-29 – 2012-11-30 (×3): 80 mg via ORAL
  Filled 2012-11-29 (×7): qty 1

## 2012-11-29 NOTE — Progress Notes (Signed)
Subjective:  Still mildly short of breath but no chest pain  Objective:  Vital Signs in the last 24 hours: BP 102/53  Pulse 75  Temp(Src) 98.3 F (36.8 C) (Oral)  Resp 19  Ht 5\' 8"  (1.727 m)  Wt 103.6 kg (228 lb 6.3 oz)  BMI 34.74 kg/m2  SpO2 93%  Physical Exam: Pleasant obese male in no acute distress Lungs: Mild rhonchi Cardiac:  Regular rhythm, normal S1 and S2, no S3 Extremities:  No edema present  Intake/Output from previous day: 05/30 0701 - 05/31 0700 In: 243 [P.O.:240; I.V.:3] Out: 1500 [Urine:1500] Weight Filed Weights   11/25/12 1852 11/26/12 0041 11/27/12 0000  Weight: 100.6 kg (221 lb 12.5 oz) 100.7 kg (222 lb 0.1 oz) 103.6 kg (228 lb 6.3 oz)    Lab Results: Basic Metabolic Panel:  Recent Labs  16/10/96 0520 11/29/12 0440  NA 138 137  K 4.0 3.8  CL 100 96  CO2 29 31  GLUCOSE 145* 138*  BUN 12 13  CREATININE 0.84 0.87    CBC:  Recent Labs  11/27/12 0440 11/28/12 0520  WBC 11.4* 11.6*  NEUTROABS 7.5 7.9*  HGB 13.6 14.0  HCT 40.1 42.2  MCV 92.8 93.4  PLT 205 226    BNP    Component Value Date/Time   PROBNP 3110.0* 11/27/2012 0440    PROTIME: Lab Results  Component Value Date   INR 1.15 11/25/2012   INR 1.0 09/13/2008    Telemetry: Reviewed and converted to sinus rhythm at 2 AM this morning.  Assessment/Plan:  1. Atrial flutter converted to sinus rhythm 2. Acute diastolic heart failure improving 3. History of coronary artery disease 4. Long-term anticoagulation with Eliquus 5. Recent sepsis  Recommendations:  Continue apixaban and metoprolol. Continue other therapy. Check EKG.     Darden Palmer  MD [facility]  11/29/2012, 9:42 AM

## 2012-11-29 NOTE — Progress Notes (Addendum)
Subjective: Converted to sinus rhythm early this morning.  Feeling much better overall.  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  Likely switch over to oral diuresis today.  Has a history of COPD and may need home oxygen.  Currently on oral antibiotic therapy for probable bronchitis and chronic urethritis.  We'll see how he does over the next 48 hours  Objective: Weight change:   Intake/Output Summary (Last 24 hours) at 11/29/12 1055 Last data filed at 11/28/12 2338  Gross per 24 hour  Intake    240 ml  Output   1500 ml  Net  -1260 ml   Filed Vitals:   11/28/12 1712 11/28/12 1958 11/29/12 0421 11/29/12 0905  BP: 106/82 116/58 102/53   Pulse: 79 84 75   Temp:  98.2 F (36.8 C) 98.3 F (36.8 C)   TempSrc:  Oral Oral   Resp:  20 19   Height:      Weight:      SpO2:  93% 93% 93%   General Appearance: Alert, cooperative, no distress, appears stated age  Lungs: Clear to auscultation bilaterally but rhonchi with cough  Heart: Regular rhythm, S1 and S2 normal, no murmur, rub or gallop. Rate controlled  Abdomen: Soft, non-tender, bowel sounds active all four quadrants, no masses, no organomegaly. Patient has colostomy as well as suprapubic tube and urine is slightly cloudy GU: Purulent urethral discharge markedly improved, minimal at this time Extremities: Extremities normal, atraumatic, no cyanosis or edema  Neuro: Oriented x2, to name and place, nonfocal exam   Lab Results: Results for orders placed during the hospital encounter of 11/25/12 (from the past 48 hour(s))  BASIC METABOLIC PANEL     Status: Abnormal   Collection Time    11/28/12  5:20 AM      Result Value Range   Sodium 138  135 - 145 mEq/L   Potassium 4.0  3.5 - 5.1 mEq/L   Comment: DELTA CHECK NOTED   Chloride 100  96 - 112 mEq/L   CO2 29  19 - 32 mEq/L   Glucose, Bld 145 (*) 70 - 99 mg/dL   BUN 12  6 - 23 mg/dL   Creatinine, Ser 2.72  0.50 - 1.35 mg/dL   Calcium 9.2  8.4 - 53.6 mg/dL   GFR calc non Af Amer 79 (*)  >90 mL/min   GFR calc Af Amer >90  >90 mL/min   Comment:            The eGFR has been calculated     using the CKD EPI equation.     This calculation has not been     validated in all clinical     situations.     eGFR's persistently     <90 mL/min signify     possible Chronic Kidney Disease.  CBC WITH DIFFERENTIAL     Status: Abnormal   Collection Time    11/28/12  5:20 AM      Result Value Range   WBC 11.6 (*) 4.0 - 10.5 K/uL   RBC 4.52  4.22 - 5.81 MIL/uL   Hemoglobin 14.0  13.0 - 17.0 g/dL   HCT 64.4  03.4 - 74.2 %   MCV 93.4  78.0 - 100.0 fL   MCH 31.0  26.0 - 34.0 pg   MCHC 33.2  30.0 - 36.0 g/dL   RDW 59.5  63.8 - 75.6 %   Platelets 226  150 - 400 K/uL   Neutrophils Relative % 68  43 - 77 %   Lymphocytes Relative 20  12 - 46 %   Monocytes Relative 7  3 - 12 %   Eosinophils Relative 4  0 - 5 %   Basophils Relative 1  0 - 1 %   Neutro Abs 7.9 (*) 1.7 - 7.7 K/uL   Lymphs Abs 2.3  0.7 - 4.0 K/uL   Monocytes Absolute 0.8  0.1 - 1.0 K/uL   Eosinophils Absolute 0.5  0.0 - 0.7 K/uL   Basophils Absolute 0.1  0.0 - 0.1 K/uL   WBC Morphology TOXIC GRANULATION     Comment: ATYPICAL LYMPHOCYTES  BASIC METABOLIC PANEL     Status: Abnormal   Collection Time    11/29/12  4:40 AM      Result Value Range   Sodium 137  135 - 145 mEq/L   Potassium 3.8  3.5 - 5.1 mEq/L   Chloride 96  96 - 112 mEq/L   CO2 31  19 - 32 mEq/L   Glucose, Bld 138 (*) 70 - 99 mg/dL   BUN 13  6 - 23 mg/dL   Creatinine, Ser 0.98  0.50 - 1.35 mg/dL   Calcium 9.2  8.4 - 11.9 mg/dL   GFR calc non Af Amer 78 (*) >90 mL/min   GFR calc Af Amer >90  >90 mL/min   Comment:            The eGFR has been calculated     using the CKD EPI equation.     This calculation has not been     validated in all clinical     situations.     eGFR's persistently     <90 mL/min signify     possible Chronic Kidney Disease.    Studies/Results: No results found. Medications: Scheduled Meds: . antiseptic oral rinse  15 mL  Mouth Rinse BID  . apixaban  5 mg Oral BID  . aspirin EC  81 mg Oral Daily  . atorvastatin  20 mg Oral Daily  . azithromycin  250 mg Oral Daily  . buPROPion  100 mg Oral BID  . cefUROXime  500 mg Oral BID WC  . citalopram  20 mg Oral QPM  . diltiazem  240 mg Oral Daily  . fluticasone  2 puff Inhalation BID  . metoprolol tartrate  25 mg Oral Q8H  . potassium chloride  20 mEq Oral BID  . sodium chloride  3 mL Intravenous Q12H   Continuous Infusions: . sodium chloride 1,000 mL (11/25/12 2205)   PRN Meds:.ondansetron (ZOFRAN) IV, ondansetron  Assessment/Plan:  Principal Problem:  Sepsis - patient much improved. Blood cultures are negative as far. Respiratory culture growing standard oropharyngeal bacteria, nonspecific Urine culture is mixed which is likely chronic.  Infection could have been coming from his old prostatic bed.  He was having a significant urethral discharge on admission which has improved a great deal.  We'll continue azithromycin and Ceftin for one more week  Active Problems:  Rectourethral fistula - history of  GERD (gastroesophageal reflux disease) - asymptomatic  Depressive disorder, not elsewhere classified  Pure hypercholesterolemia  Malignant neoplasm of prostate - history of  Myocardial infarction, history of  Aortic aneurysm  Hypertension - controlled  Left bundle branch block  Bladder cancer, history of  S/P CABG x 4  Acute respiratory failure - improving - likely combination of COPD exacerbation and CHF. We'll check room air O2 saturations today and tomorrow and try to increase ambulation  COPD (chronic obstructive pulmonary disease) - symptomatically improving, no wheezes present - continue Pulmicort, 2 inhalations twice a day  Acute diastolic CHF (congestive heart failure) - diuresis increased and symptomatically improving.  Also better because of conversion to sinus rhythm probably  Atrial fibrillation/atrial flutter - converted to sinus rhythm and now  is on anticoagulation therapy with Eliquis  Disposition - ambulate and hopefully home tomorrow or Monday    LOS: 4 days   Pearla Dubonnet, MD 11/29/2012, 10:55 AM

## 2012-11-30 LAB — CULTURE, ROUTINE-GENITAL

## 2012-11-30 LAB — BASIC METABOLIC PANEL
BUN: 18 mg/dL (ref 6–23)
CO2: 35 mEq/L — ABNORMAL HIGH (ref 19–32)
Calcium: 9.1 mg/dL (ref 8.4–10.5)
Creatinine, Ser: 1.01 mg/dL (ref 0.50–1.35)
Glucose, Bld: 127 mg/dL — ABNORMAL HIGH (ref 70–99)

## 2012-11-30 LAB — PRO B NATRIURETIC PEPTIDE: Pro B Natriuretic peptide (BNP): 685.7 pg/mL — ABNORMAL HIGH (ref 0–450)

## 2012-11-30 MED ORDER — CEFUROXIME AXETIL 500 MG PO TABS: 500.0000 mg | ORAL_TABLET | Freq: Two times a day (BID) | ORAL | Status: DC

## 2012-11-30 MED ORDER — METOPROLOL TARTRATE 25 MG PO TABS: 25.0000 mg | ORAL_TABLET | Freq: Two times a day (BID) | ORAL | Status: DC

## 2012-11-30 MED ORDER — CITALOPRAM HYDROBROMIDE 20 MG PO TABS: 10.0000 mg | ORAL_TABLET | Freq: Every evening | ORAL | Status: DC

## 2012-11-30 MED ORDER — FUROSEMIDE 80 MG PO TABS: 80.0000 mg | ORAL_TABLET | Freq: Two times a day (BID) | ORAL | Status: AC

## 2012-11-30 MED ORDER — AZITHROMYCIN 250 MG PO TABS: 250.0000 mg | ORAL_TABLET | Freq: Every day | ORAL | Status: AC

## 2012-11-30 MED ORDER — DILTIAZEM HCL ER COATED BEADS 240 MG PO CP24: 240.0000 mg | ORAL_CAPSULE | Freq: Every day | ORAL | Status: DC

## 2012-11-30 MED ORDER — POTASSIUM CHLORIDE CRYS ER 20 MEQ PO TBCR: 20.0000 meq | EXTENDED_RELEASE_TABLET | Freq: Two times a day (BID) | ORAL | Status: AC

## 2012-11-30 MED ORDER — APIXABAN 5 MG PO TABS: 5.0000 mg | ORAL_TABLET | Freq: Two times a day (BID) | ORAL | Status: DC

## 2012-11-30 NOTE — Progress Notes (Signed)
Subjective: Patient feeling much better today.  Still on nasal cannula O2 and will assess today to see if he needs home O2.  Still has not been ambulating and we'll try to attempt to ambulate him today to see if he can do so and see if he desaturates on room air.  Urethral discharge is virtually resolved.  Feels much less short of breath on twice a day Lasix and O2 saturations are much improved.  May not need home oxygen but will verify whether or not he does today  Objective: Weight change:   Intake/Output Summary (Last 24 hours) at 11/30/12 1211 Last data filed at 11/30/12 0618  Gross per 24 hour  Intake    560 ml  Output   1700 ml  Net  -1140 ml   Filed Vitals:   11/29/12 0905 11/29/12 1423 11/29/12 2011 11/30/12 0617  BP:  110/48 99/48 101/46  Pulse:  77 76 64  Temp:  97.6 F (36.4 C) 97.6 F (36.4 C) 97.8 F (36.6 C)  TempSrc:  Oral Oral Oral  Resp:  18 18 20   Height:      Weight:      SpO2: 93% 90% 94% 96%   General Appearance: Alert, cooperative, no distress, appears stated age  Lungs: Clearing to auscultation bilaterally with minimal rhonchi with cough  Heart: Regular rhythm, S1 and S2 normal, no murmur, rub or gallop. Rate controlled  Abdomen: Soft, non-tender, bowel sounds active all four quadrants, no masses, no organomegaly. Patient has colostomy as well as suprapubic tube and urine is slightly cloudy  GU: Purulent urethral discharge markedly improved, minimal at this time  Extremities: Extremities normal, atraumatic, no cyanosis or edema  Neuro: Oriented x2, to name and place, nonfocal exam   Lab Results: Results for orders placed during the hospital encounter of 11/25/12 (from the past 48 hour(s))  BASIC METABOLIC PANEL     Status: Abnormal   Collection Time    11/29/12  4:40 AM      Result Value Range   Sodium 137  135 - 145 mEq/L   Potassium 3.8  3.5 - 5.1 mEq/L   Chloride 96  96 - 112 mEq/L   CO2 31  19 - 32 mEq/L   Glucose, Bld 138 (*) 70 - 99 mg/dL   BUN 13  6 - 23 mg/dL   Creatinine, Ser 1.61  0.50 - 1.35 mg/dL   Calcium 9.2  8.4 - 09.6 mg/dL   GFR calc non Af Amer 78 (*) >90 mL/min   GFR calc Af Amer >90  >90 mL/min   Comment:            The eGFR has been calculated     using the CKD EPI equation.     This calculation has not been     validated in all clinical     situations.     eGFR's persistently     <90 mL/min signify     possible Chronic Kidney Disease.  BASIC METABOLIC PANEL     Status: Abnormal   Collection Time    11/30/12  3:45 AM      Result Value Range   Sodium 135  135 - 145 mEq/L   Potassium 3.7  3.5 - 5.1 mEq/L   Chloride 94 (*) 96 - 112 mEq/L   CO2 35 (*) 19 - 32 mEq/L   Glucose, Bld 127 (*) 70 - 99 mg/dL   BUN 18  6 - 23 mg/dL  Creatinine, Ser 1.01  0.50 - 1.35 mg/dL   Calcium 9.1  8.4 - 04.5 mg/dL   GFR calc non Af Amer 67 (*) >90 mL/min   GFR calc Af Amer 78 (*) >90 mL/min   Comment:            The eGFR has been calculated     using the CKD EPI equation.     This calculation has not been     validated in all clinical     situations.     eGFR's persistently     <90 mL/min signify     possible Chronic Kidney Disease.  PRO B NATRIURETIC PEPTIDE     Status: Abnormal   Collection Time    11/30/12  3:45 AM      Result Value Range   Pro B Natriuretic peptide (BNP) 685.7 (*) 0 - 450 pg/mL    Studies/Results: No results found. Medications: Scheduled Meds: . antiseptic oral rinse  15 mL Mouth Rinse BID  . apixaban  5 mg Oral BID  . aspirin EC  81 mg Oral Daily  . atorvastatin  20 mg Oral Daily  . azithromycin  250 mg Oral Daily  . buPROPion  100 mg Oral BID  . cefUROXime  500 mg Oral BID WC  . citalopram  20 mg Oral QPM  . diltiazem  240 mg Oral Daily  . fluticasone  2 puff Inhalation BID  . furosemide  80 mg Oral BID  . metoprolol tartrate  25 mg Oral Q8H  . potassium chloride  20 mEq Oral BID  . sodium chloride  3 mL Intravenous Q12H   Continuous Infusions: . sodium chloride 1,000 mL  (11/25/12 2205)   PRN Meds:.ondansetron (ZOFRAN) IV, ondansetron  Assessment/Plan:  Principal Problem:  Sepsis - patient much improved. Blood cultures are negative as far. Respiratory culture growing standard oropharyngeal bacteria, nonspecific Urine culture is mixed which is likely chronic. Infection could have been coming from his old prostatic bed. He was having a significant urethral discharge on admission which has improved a great deal. We'll continue azithromycin and Ceftin for one week and decrease citalopram dose while on azithromycin to help prevent QTC prolongation  Active Problems:  Rectourethral fistula - history of  GERD (gastroesophageal reflux disease) - asymptomatic  Depressive disorder, not elsewhere classified - Doing well on current medications Pure hypercholesterolemia  Malignant neoplasm of prostate - history of  Myocardial infarction, history of  Aortic aneurysm  Hypertension - controlled  Left bundle branch block  Bladder cancer, history of  S/P CABG x 4  Acute respiratory failure - improving - likely combination of COPD exacerbation and CHF - symptomatically much improved today We'll check room air O2 saturations today and tomorrow and try to increase ambulation  COPD (chronic obstructive pulmonary disease) - symptomatically improving, no wheezes present - will not continue Pulmicort as an outpatient secondary to much improved pulmonary function with diuresis  Acute diastolic CHF (congestive heart failure) - diuresis increased and symptomatically much improved. Also better because of conversion to sinus rhythm probably  Atrial fibrillation/atrial flutter - converted to sinus rhythm and now is on anticoagulation therapy with Eliquis  Disposition - ambulate today and check O2 saturations on room air and home tomorrow with home health nursing and physical therapy    LOS: 5 days   Pearla Dubonnet, MD 11/30/2012, 12:11 PM

## 2012-11-30 NOTE — Progress Notes (Signed)
Subjective:  He feels good today and is no longer short of breath. He remains in sinus rhythm but has a lot of PVCs.  Objective:  Vital Signs in the last 24 hours: BP 101/46  Pulse 64  Temp(Src) 97.8 F (36.6 C) (Oral)  Resp 20  Ht 5\' 8"  (1.727 m)  Wt 103.6 kg (228 lb 6.3 oz)  BMI 34.74 kg/m2  SpO2 96%  Physical Exam: Pleasant obese male in no acute distress Lungs: Mild rhonchi Cardiac:  Regular rhythm, normal S1 and S2, no S3 Extremities:  No edema present  Intake/Output from previous day: 05/31 0701 - 06/01 0700 In: 560 [P.O.:560] Out: 1700 [Urine:1700] Weight Filed Weights   11/25/12 1852 11/26/12 0041 11/27/12 0000  Weight: 100.6 kg (221 lb 12.5 oz) 100.7 kg (222 lb 0.1 oz) 103.6 kg (228 lb 6.3 oz)    Lab Results: Basic Metabolic Panel:  Recent Labs  16/10/96 0440 11/30/12 0345  NA 137 135  K 3.8 3.7  CL 96 94*  CO2 31 35*  GLUCOSE 138* 127*  BUN 13 18  CREATININE 0.87 1.01    CBC:  Recent Labs  11/28/12 0520  WBC 11.6*  NEUTROABS 7.9*  HGB 14.0  HCT 42.2  MCV 93.4  PLT 226    BNP    Component Value Date/Time   PROBNP 685.7* 11/30/2012 0345    PROTIME: Lab Results  Component Value Date   INR 1.15 11/25/2012   INR 1.0 09/13/2008    Telemetry: He is in sinus rhythm with some PVCs and occasional PACs.  Assessment/Plan:  1. Atrial flutter converted to sinus rhythm 2. Acute diastolic heart failure improving 3. History of coronary artery disease 4. Long-term anticoagulation with Eliquus 5. Recent sepsis  Recommendations:  Continue apixaban and metoprolol. Continue other therapy.    Darden Palmer  MD [facility]  11/30/2012, 10:33 AM

## 2012-11-30 NOTE — Progress Notes (Signed)
Patient placed on room air for 20 minutes. SaO2 on room air was 94%.  Patient was then ambulated on room air for 200 feet. SaO2 on room air post ambulation was 78%. Patient's breathing was labored.  When at rest and placed back on his oxygen his SaO2 was 96%.

## 2012-12-01 DIAGNOSIS — R0902 Hypoxemia: Secondary | ICD-10-CM | POA: Diagnosis present

## 2012-12-01 LAB — CULTURE, BLOOD (ROUTINE X 2)
Culture: NO GROWTH
Culture: NO GROWTH

## 2012-12-01 LAB — BASIC METABOLIC PANEL
BUN: 18 mg/dL (ref 6–23)
CO2: 32 mEq/L (ref 19–32)
Calcium: 9.3 mg/dL (ref 8.4–10.5)
Creatinine, Ser: 0.78 mg/dL (ref 0.50–1.35)
Glucose, Bld: 122 mg/dL — ABNORMAL HIGH (ref 70–99)

## 2012-12-01 NOTE — Care Management Note (Signed)
    Page 1 of 2   12/01/2012     3:51:36 PM   CARE MANAGEMENT NOTE 12/01/2012  Patient:  Zachary Haas, Zachary Haas   Account Number:  [id number]  Date Initiated:  12/01/2012  Documentation initiated by:  Dennie Moltz  Subjective/Objective Assessment:   PT ADM ON 11/25/12 WITH SEPSIS, COPD, CHF.  PTA, PT INDEPENDENT, LIVES WITH SPOUSE.     Action/Plan:   PT NEEDS HH FOLLOW UP AND HOME O2 AT DC.  WIFE TO PROVIDE CARE.   Anticipated DC Date:  12/01/2012   Anticipated DC Plan:  HOME W HOME HEALTH SERVICES      DC Planning Services  CM consult      [facility] Choice  HOME HEALTH   Choice offered to / List presented to:  C-1 Patient   DME arranged  OXYGEN      DME agency  [facility]     HH arranged  HH-1 RN  HH-2 PT      [facility] agency  [facility]   Status of service:  Completed, signed off Medicare Important Message given?   (If response is "NO", the following Medicare IM given date fields will be blank) Date Medicare IM given:   Date Additional Medicare IM given:    Discharge Disposition:  HOME W HOME HEALTH SERVICES  Per UR Regulation:    If discussed at Long Length of Stay Meetings, dates discussed:    Comments:  12/01/12 Faiga Stones,RN,BSN [phone] PT FOR DC HOME WITH SPOUSE.  HOME OXYGEN ORDERED; REFERRAL TO [facility]--PORTABLE TANK DELIVERED TO PT'S ROOM PRIOR TO DC HOME.  PT NEEDS HH FOLLOW UP; REFERRAL TO [facility], PER PT CHOICE.  START OF CARE 24-48H POST DC DATE.

## 2012-12-01 NOTE — Progress Notes (Signed)
Delivered and instructed patient and wife on oxygen.  Zachary Haas [phone]

## 2012-12-01 NOTE — Discharge Summary (Signed)
Physician Discharge Summary  NAME:Zachary Haas  XBJ:[id number]  DOB: 1928/02/23   Admit date: 11/25/2012 Discharge date: 12/01/2012  Discharge Diagnoses:   Principal Problem: Sepsis - patient much improved. Blood cultures remained negative. Respiratory culture grew standard oropharyngeal bacteria, nonspecific urine culture is mixed which is likely chronic. Infection could have been coming from his old prostatic bed with increased urethral discharge, which has markedly improved. We'll continue azithromycin and Ceftin x one week and decrease citalopram dose while on azithromycin to prevent QTC prolongation   Active Problems:  Rectourethral fistula - history of  GERD (gastroesophageal reflux disease) - asymptomatic  Depressive disorder, not elsewhere classified - Doing well on current medications  Pure hypercholesterolemia  Malignant neoplasm of prostate - history of  Myocardial infarction, history of  Aortic aneurysm  Hypertension - controlled  Left bundle branch block  Bladder cancer, history of  S/P CABG x 4  Acute respiratory failure - resolved - likely combination of COPD exacerbation and CHF - symptomatically much improved COPD (chronic obstructive pulmonary disease) - symptomatically improving, no wheezes present - will continue Pulmicort as an outpatient Acute diastolic CHF (congestive heart failure) - diuresis increased and symptomatically much improved. Also likely clinically improved because of conversion to sinus rhythm Atrial fibrillation/atrial flutter - converted to sinus rhythm and now is on anticoagulation therapy with Eliquis  Hypoxemia - ambulate today prior to discharge and check O2 saturations on room air, on 2L with ambulation, and on 2L at rest Disposition - Home today with home health nursing and physical therapy   Discharge Physical Exam:  General Appearance: Alert, cooperative, no distress, appears stated age  Weight change:   Intake/Output Summary (Last 24  hours) at 12/01/12 0324 Last data filed at 11/30/12 0618  Gross per 24 hour  Intake      0 ml  Output    500 ml  Net   -500 ml   Filed Vitals:   11/30/12 1300 11/30/12 1351 11/30/12 1955 11/30/12 2015  BP:  98/41 101/47   Pulse:  69 73   Temp:  97.4 F (36.3 C) 98.1 F (36.7 C)   TempSrc:  Oral Oral   Resp:  18 19   Height:      Weight:      SpO2: 96% 94% 92% 93%   General Appearance: Alert, cooperative, no distress, appears stated age  Lungs: Clearing to auscultation bilaterally with minimal rhonchi with cough  Heart: Regular rhythm, S1 and S2 normal, no murmur, rub or gallop. Rate controlled  Abdomen: Soft, non-tender, bowel sounds active all four quadrants, no masses, no organomegaly. Patient has colostomy as well as suprapubic tube and urine is slightly cloudy  GU: Purulent urethral discharge markedly improved, minimal at this time  Extremities: Extremities normal, atraumatic, no cyanosis or edema  Neuro: Oriented x2, to name and place, nonfocal exam   Discharge Condition: much improved  Hospital Course:  Mr.Zachary Haas is a very pleasant, but unfortunate84 y.o. male with past medical history of coronary disease, coronary artery bypass grafting, COPD, and prostate cancer, bladder cancer, colostomy and suprapubic catheter, who had felt poorly for one week prior to admission with increasing shortness of breath, cough, fevers and chills. He presented to my office on May 27 and was found to be tachycardic with heart rate of approximately 160, febrile, hypoxic and was promptly referred to the emergency room. In the emergency room he was found to be in acute atrial fibrillation with rapid ventricular response, code sepsis was  started and patient was given boluses of saline. He remained tachypneic, hypoxic, heart rate in the 130s on a Cardizem drip and was admitted to step down unit.  Cardiology consultation was obtained, and beta blockade was continued. He ultimately converted back  to sinus rhythm on the early a.m. Of May 31. He had marked chest congestion on admission and was producing dark phlegm, but respiratory culture grew oropharyngeal bacteria, and his urinary culture was a mixed culture, and he does have a suprapubic catheter. He very heavy, urethral discharge, which likely drained serous chronically inflamed prostatic bed and after antibiotic therapy was started, this resolved almost completely. The source of his infection may have been the prostatic and was clinically represented by his urethral discharge. Also while hospitalized, he was felt to be in diastolic congestive heart failure and even after diuresis, was hypoxemic ambulation, which is likely secondary to chronic COPD. He will be discharged on inhaled oral steroids, but will try to minimize bronchodilators, secondary to propensity for cardiac dysrhythmia, i.e. Atrial fibrillation and PVCs. Patient is still quite weak and he will need home physical therapy and occupational therapy, as well as home nursing visits for followup of COPD and CHF. He is homebound secondary to clinical status of severe deconditioning with hypoxemia with exertion   Things to follow up in the outpatient setting:  Oxygen saturation/CHF/COPD/suprapubic catheter/colostomy/unsteady gait/urethral infection  Consults: Treatment Team:  Lesleigh Noe, MD  Disposition: Home or Self Care - with home health nursing, physical therapy, occupational therapy, and home treatment with nasal cannula O2  Discharge Orders   Future Appointments Provider Department Dept Phone   12/18/2012 12:00 PM Mariella Saa, MD [facility], [place] [phone]   Future Orders Complete By Expires     (HEART FAILURE PATIENTS) Call MD:  Anytime you have any of the following symptoms: 1) 3 pound weight gain in 24 hours or 5 pounds in 1 week 2) shortness of breath, with or without a dry hacking cough 3) swelling in the hands, feet or stomach 4) if you  have to sleep on extra pillows at night in order to breathe.  As directed     Call MD for:  difficulty breathing, headache or visual disturbances  As directed     Call MD for:  temperature >100.4  As directed     Diet - low sodium heart healthy  As directed     Discharge instructions  As directed     Comments:      Will need HH nursing for new onset CHF and probable sepsis syndrome with UTI and Physical Tx for gait disorder and possible home O2 for hypoxemia    Increase activity slowly  As directed         Medication List    TAKE these medications       apixaban 5 MG Tabs tablet  Commonly known as:  ELIQUIS  Take 1 tablet (5 mg total) by mouth 2 (two) times daily.     aspirin 81 MG tablet  Take 81 mg by mouth daily.     azithromycin 250 MG tablet  Commonly known as:  ZITHROMAX  Take 1 tablet (250 mg total) by mouth daily.     buPROPion 100 MG tablet  Commonly known as:  WELLBUTRIN  Take 100 mg by mouth 2 (two) times daily.     cefUROXime 500 MG tablet  Commonly known as:  CEFTIN  Take 1 tablet (500 mg total) by mouth 2 (  two) times daily with a meal.     citalopram 20 MG tablet  Commonly known as:  CELEXA  Take 0.5 tablets (10 mg total) by mouth every evening.     dextromethorphan-guaiFENesin 30-600 MG per 12 hr tablet  Commonly known as:  MUCINEX DM  Take 2 tablets by mouth every 12 (twelve) hours.     diltiazem 240 MG 24 hr capsule  Commonly known as:  CARDIZEM CD  Take 1 capsule (240 mg total) by mouth daily.     furosemide 80 MG tablet  Commonly known as:  LASIX  Take 1 tablet (80 mg total) by mouth 2 (two) times daily.     isosorbide mononitrate 30 MG 24 hr tablet  Commonly known as:  IMDUR  Take 30 mg by mouth daily before breakfast.     metoprolol tartrate 25 MG tablet  Commonly known as:  LOPRESSOR  Take 1 tablet (25 mg total) by mouth 2 (two) times daily.     multivitamin with minerals tablet  Take 1 tablet by mouth daily.     potassium chloride SA  20 MEQ tablet  Commonly known as:  K-DUR,KLOR-CON  Take 1 tablet (20 mEq total) by mouth 2 (two) times daily.     psyllium 0.52 G capsule  Commonly known as:  REGULOID  Take 104 g by mouth 2 (two) times daily.     simvastatin 40 MG tablet  Commonly known as:  ZOCOR  Take 40 mg by mouth daily before breakfast.     SM CRANBERRY 300 MG tablet  Generic drug:  Cranberry  Take 300 mg by mouth 3 (three) times daily.         The results of significant diagnostics from this hospitalization (including imaging, microbiology, ancillary and laboratory) are listed below for reference.    Significant Diagnostic Studies: Dg Chest Port 1 View  11/27/2012   *RADIOLOGY REPORT*  Clinical Data: Productive cough, slight shortness of breath  PORTABLE CHEST - 1 VIEW  Comparison: Chest x-ray of 11/25/2012 and  Findings: There is more indistinctness of perihilar vasculature most consistent with slight worsening of pulmonary vascular congestion.  No definite effusion is seen.  Mild cardiomegaly is stable.  Median sternotomy sutures are noted from prior CABG.  IMPRESSION: Slight worsening of probable pulmonary vascular congestion.   Original Report Authenticated By: Dwyane Dee, M.D.   Dg Chest Port 1 View  11/25/2012   *RADIOLOGY REPORT*  Clinical Data: Cough, fever  PORTABLE CHEST - 1 VIEW  Comparison: 10/20/2012  Findings: Previous coronary bypass changes noted.  Increased bibasilar interstitial opacities could represent chronic interstitial lung disease versus early developing basilar edema. Minimal streaky atelectasis also suspected in the lower lobes.  No effusion or pneumothorax.  Background COPD/emphysema suspected.  IMPRESSION: COPD/emphysema  Increased basilar interstitial changes versus developing edema.   Original Report Authenticated By: Judie Petit. Miles Costain, M.D.    Microbiology: Recent Results (from the past 240 hour(s))  CULTURE, BLOOD (ROUTINE X 2)     Status: None   Collection Time    11/25/12  3:00 PM       Result Value Range Status   Specimen Description BLOOD RIGHT ARM   Final   Special Requests BOTTLES DRAWN AEROBIC AND ANAEROBIC 10CC   Final   Culture  Setup Time 11/25/2012 20:35   Final   Culture     Final   Value:        BLOOD CULTURE RECEIVED NO GROWTH TO DATE CULTURE WILL BE HELD  FOR 5 DAYS BEFORE ISSUING A FINAL NEGATIVE REPORT   Report Status PENDING   Incomplete  CULTURE, BLOOD (ROUTINE X 2)     Status: None   Collection Time    11/25/12  3:15 PM      Result Value Range Status   Specimen Description BLOOD RIGHT HAND   Final   Special Requests     Final   Value: BOTTLES DRAWN AEROBIC AND ANAEROBIC 10CC BLUE 7CC RED   Culture  Setup Time 11/25/2012 20:36   Final   Culture     Final   Value:        BLOOD CULTURE RECEIVED NO GROWTH TO DATE CULTURE WILL BE HELD FOR 5 DAYS BEFORE ISSUING A FINAL NEGATIVE REPORT   Report Status PENDING   Incomplete  CULTURE, EXPECTORATED SPUTUM-ASSESSMENT     Status: None   Collection Time    11/25/12  3:56 PM      Result Value Range Status   Specimen Description SPUTUM   Final   Special Requests Normal   Final   Sputum evaluation     Final   Value: THIS SPECIMEN IS ACCEPTABLE. RESPIRATORY CULTURE REPORT TO FOLLOW.   Report Status 11/25/2012 FINAL   Final  CULTURE, RESPIRATORY (NON-EXPECTORATED)     Status: None   Collection Time    11/25/12  3:56 PM      Result Value Range Status   Specimen Description SPUTUM   Final   Special Requests NONE   Final   Gram Stain     Final   Value: ABUNDANT WBC PRESENT, PREDOMINANTLY PMN     NO SQUAMOUS EPITHELIAL CELLS SEEN     MODERATE GRAM POSITIVE RODS   Culture NORMAL OROPHARYNGEAL FLORA   Final   Report Status 11/27/2012 FINAL   Final  URINE CULTURE     Status: None   Collection Time    11/25/12  4:01 PM      Result Value Range Status   Specimen Description URINE, CATHETERIZED   Final   Special Requests NONE   Final   Culture  Setup Time 11/25/2012 15:25   Final   Colony Count >=100,000  COLONIES/ML   Final   Culture     Final   Value: Multiple bacterial morphotypes present, none predominant. Suggest appropriate recollection if clinically indicated.   Report Status 11/26/2012 FINAL   Final  MRSA PCR SCREENING     Status: None   Collection Time    11/25/12  7:04 PM      Result Value Range Status   MRSA by PCR NEGATIVE  NEGATIVE Final   Comment:            The GeneXpert MRSA Assay (FDA     approved for NASAL specimens     only), is one component of a     comprehensive MRSA colonization     surveillance program. It is not     intended to diagnose MRSA     infection nor to guide or     monitor treatment for     MRSA infections.  CULTURE, ROUTINE-GENITAL     Status: None   Collection Time    11/27/12  9:57 AM      Result Value Range Status   Specimen Description URETHRA   Final   Special Requests URETHRAL DISCHARGE   Final   Culture MULTIPLE ORGANISMS PRESENT, NONE PREDOMINANT   Final   Report Status 11/30/2012 FINAL   Final  Labs: Results for orders placed during the hospital encounter of 11/25/12  CULTURE, BLOOD (ROUTINE X 2)      Result Value Range   Specimen Description BLOOD RIGHT ARM     Special Requests BOTTLES DRAWN AEROBIC AND ANAEROBIC 10CC     Culture  Setup Time 11/25/2012 20:35     Culture       Value:        BLOOD CULTURE RECEIVED NO GROWTH TO DATE CULTURE WILL BE HELD FOR 5 DAYS BEFORE ISSUING A FINAL NEGATIVE REPORT   Report Status PENDING    CULTURE, BLOOD (ROUTINE X 2)      Result Value Range   Specimen Description BLOOD RIGHT HAND     Special Requests       Value: BOTTLES DRAWN AEROBIC AND ANAEROBIC 10CC BLUE 7CC RED   Culture  Setup Time 11/25/2012 20:36     Culture       Value:        BLOOD CULTURE RECEIVED NO GROWTH TO DATE CULTURE WILL BE HELD FOR 5 DAYS BEFORE ISSUING A FINAL NEGATIVE REPORT   Report Status PENDING    URINE CULTURE      Result Value Range   Specimen Description URINE, CATHETERIZED     Special Requests NONE      Culture  Setup Time 11/25/2012 15:25     Colony Count >=100,000 COLONIES/ML     Culture       Value: Multiple bacterial morphotypes present, none predominant. Suggest appropriate recollection if clinically indicated.   Report Status 11/26/2012 FINAL    CULTURE, EXPECTORATED SPUTUM-ASSESSMENT      Result Value Range   Specimen Description SPUTUM     Special Requests Normal     Sputum evaluation       Value: THIS SPECIMEN IS ACCEPTABLE. RESPIRATORY CULTURE REPORT TO FOLLOW.   Report Status 11/25/2012 FINAL    MRSA PCR SCREENING      Result Value Range   MRSA by PCR NEGATIVE  NEGATIVE  CULTURE, RESPIRATORY (NON-EXPECTORATED)      Result Value Range   Specimen Description SPUTUM     Special Requests NONE     Gram Stain       Value: ABUNDANT WBC PRESENT, PREDOMINANTLY PMN     NO SQUAMOUS EPITHELIAL CELLS SEEN     MODERATE GRAM POSITIVE RODS   Culture NORMAL OROPHARYNGEAL FLORA     Report Status 11/27/2012 FINAL    CULTURE, ROUTINE-GENITAL      Result Value Range   Specimen Description URETHRA     Special Requests URETHRAL DISCHARGE     Culture MULTIPLE ORGANISMS PRESENT, NONE PREDOMINANT     Report Status 11/30/2012 FINAL    CBC WITH DIFFERENTIAL      Result Value Range   WBC 16.2 (*) 4.0 - 10.5 K/uL   RBC 4.85  4.22 - 5.81 MIL/uL   Hemoglobin 15.2  13.0 - 17.0 g/dL   HCT 16.1  09.6 - 04.5 %   MCV 93.0  78.0 - 100.0 fL   MCH 31.3  26.0 - 34.0 pg   MCHC 33.7  30.0 - 36.0 g/dL   RDW 40.9  81.1 - 91.4 %   Platelets 176  150 - 400 K/uL   Neutrophils Relative % 81 (*) 43 - 77 %   Lymphocytes Relative 10 (*) 12 - 46 %   Monocytes Relative 9  3 - 12 %   Eosinophils Relative 0  0 - 5 %  Basophils Relative 0  0 - 1 %   Neutro Abs 13.1 (*) 1.7 - 7.7 K/uL   Lymphs Abs 1.6  0.7 - 4.0 K/uL   Monocytes Absolute 1.5 (*) 0.1 - 1.0 K/uL   Eosinophils Absolute 0.0  0.0 - 0.7 K/uL   Basophils Absolute 0.0  0.0 - 0.1 K/uL   WBC Morphology TOXIC GRANULATION    COMPREHENSIVE METABOLIC  PANEL      Result Value Range   Sodium 134 (*) 135 - 145 mEq/L   Potassium 3.8  3.5 - 5.1 mEq/L   Chloride 99  96 - 112 mEq/L   CO2 19  19 - 32 mEq/L   Glucose, Bld 137 (*) 70 - 99 mg/dL   BUN 20  6 - 23 mg/dL   Creatinine, Ser 1.61  0.50 - 1.35 mg/dL   Calcium 8.8  8.4 - 09.6 mg/dL   Total Protein 7.4  6.0 - 8.3 g/dL   Albumin 2.5 (*) 3.5 - 5.2 g/dL   AST 48 (*) 0 - 37 U/L   ALT 35  0 - 53 U/L   Alkaline Phosphatase 79  39 - 117 U/L   Total Bilirubin 1.2  0.3 - 1.2 mg/dL   GFR calc non Af Amer 77 (*) >90 mL/min   GFR calc Af Amer 89 (*) >90 mL/min  PROCALCITONIN      Result Value Range   Procalcitonin 0.10    URINALYSIS, ROUTINE W REFLEX MICROSCOPIC      Result Value Range   Color, Urine AMBER (*) YELLOW   APPearance CLOUDY (*) CLEAR   Specific Gravity, Urine 1.024  1.005 - 1.030   pH 5.5  5.0 - 8.0   Glucose, UA NEGATIVE  NEGATIVE mg/dL   Hgb urine dipstick LARGE (*) NEGATIVE   Bilirubin Urine SMALL (*) NEGATIVE   Ketones, ur NEGATIVE  NEGATIVE mg/dL   Protein, ur 045 (*) NEGATIVE mg/dL   Urobilinogen, UA 1.0  0.0 - 1.0 mg/dL   Nitrite NEGATIVE  NEGATIVE   Leukocytes, UA LARGE (*) NEGATIVE  APTT      Result Value Range   aPTT 29  24 - 37 seconds  PROTIME-INR      Result Value Range   Prothrombin Time 14.5  11.6 - 15.2 seconds   INR 1.15  0.00 - 1.49  URINE MICROSCOPIC-ADD ON      Result Value Range   Squamous Epithelial / LPF RARE  RARE   WBC, UA 21-50  <3 WBC/hpf   RBC / HPF 0-2  <3 RBC/hpf   Bacteria, UA MANY (*) RARE   Urine-Other MUCOUS PRESENT    PRO B NATRIURETIC PEPTIDE      Result Value Range   Pro B Natriuretic peptide (BNP) 2648.0 (*) 0 - 450 pg/mL  TROPONIN I      Result Value Range   Troponin I <0.30  <0.30 ng/mL  TROPONIN I      Result Value Range   Troponin I <0.30  <0.30 ng/mL  TROPONIN I      Result Value Range   Troponin I <0.30  <0.30 ng/mL  COMPREHENSIVE METABOLIC PANEL      Result Value Range   Sodium 136  135 - 145 mEq/L    Potassium 3.4 (*) 3.5 - 5.1 mEq/L   Chloride 100  96 - 112 mEq/L   CO2 28  19 - 32 mEq/L   Glucose, Bld 177 (*) 70 - 99 mg/dL   BUN 18  6 - 23 mg/dL   Creatinine, Ser 1.61  0.50 - 1.35 mg/dL   Calcium 8.8  8.4 - 09.6 mg/dL   Total Protein 6.5  6.0 - 8.3 g/dL   Albumin 2.3 (*) 3.5 - 5.2 g/dL   AST 40 (*) 0 - 37 U/L   ALT 34  0 - 53 U/L   Alkaline Phosphatase 71  39 - 117 U/L   Total Bilirubin 0.9  0.3 - 1.2 mg/dL   GFR calc non Af Amer 77 (*) >90 mL/min   GFR calc Af Amer 89 (*) >90 mL/min  CBC      Result Value Range   WBC 10.9 (*) 4.0 - 10.5 K/uL   RBC 4.50  4.22 - 5.81 MIL/uL   Hemoglobin 13.8  13.0 - 17.0 g/dL   HCT 04.5  40.9 - 81.1 %   MCV 93.3  78.0 - 100.0 fL   MCH 30.7  26.0 - 34.0 pg   MCHC 32.9  30.0 - 36.0 g/dL   RDW 91.4  78.2 - 95.6 %   Platelets 166  150 - 400 K/uL  BASIC METABOLIC PANEL      Result Value Range   Sodium 134 (*) 135 - 145 mEq/L   Potassium 3.2 (*) 3.5 - 5.1 mEq/L   Chloride 99  96 - 112 mEq/L   CO2 21  19 - 32 mEq/L   Glucose, Bld 105 (*) 70 - 99 mg/dL   BUN 15  6 - 23 mg/dL   Creatinine, Ser 2.13  0.50 - 1.35 mg/dL   Calcium 8.7  8.4 - 08.6 mg/dL   GFR calc non Af Amer 79 (*) >90 mL/min   GFR calc Af Amer >90  >90 mL/min  CBC WITH DIFFERENTIAL      Result Value Range   WBC 11.4 (*) 4.0 - 10.5 K/uL   RBC 4.32  4.22 - 5.81 MIL/uL   Hemoglobin 13.6  13.0 - 17.0 g/dL   HCT 57.8  46.9 - 62.9 %   MCV 92.8  78.0 - 100.0 fL   MCH 31.5  26.0 - 34.0 pg   MCHC 33.9  30.0 - 36.0 g/dL   RDW 52.8  41.3 - 24.4 %   Platelets 205  150 - 400 K/uL   Neutrophils Relative % 66  43 - 77 %   Lymphocytes Relative 21  12 - 46 %   Monocytes Relative 8  3 - 12 %   Eosinophils Relative 4  0 - 5 %   Basophils Relative 1  0 - 1 %   Neutro Abs 7.5  1.7 - 7.7 K/uL   Lymphs Abs 2.4  0.7 - 4.0 K/uL   Monocytes Absolute 0.9  0.1 - 1.0 K/uL   Eosinophils Absolute 0.5  0.0 - 0.7 K/uL   Basophils Absolute 0.1  0.0 - 0.1 K/uL   WBC Morphology ATYPICAL LYMPHOCYTES     PRO B NATRIURETIC PEPTIDE      Result Value Range   Pro B Natriuretic peptide (BNP) 3110.0 (*) 0 - 450 pg/mL  BASIC METABOLIC PANEL      Result Value Range   Sodium 138  135 - 145 mEq/L   Potassium 4.0  3.5 - 5.1 mEq/L   Chloride 100  96 - 112 mEq/L   CO2 29  19 - 32 mEq/L   Glucose, Bld 145 (*) 70 - 99 mg/dL   BUN 12  6 - 23 mg/dL   Creatinine, Ser 0.10  0.50 - 1.35 mg/dL   Calcium 9.2  8.4 - 40.9 mg/dL   GFR calc non Af Amer 79 (*) >90 mL/min   GFR calc Af Amer >90  >90 mL/min  CBC WITH DIFFERENTIAL      Result Value Range   WBC 11.6 (*) 4.0 - 10.5 K/uL   RBC 4.52  4.22 - 5.81 MIL/uL   Hemoglobin 14.0  13.0 - 17.0 g/dL   HCT 81.1  91.4 - 78.2 %   MCV 93.4  78.0 - 100.0 fL   MCH 31.0  26.0 - 34.0 pg   MCHC 33.2  30.0 - 36.0 g/dL   RDW 95.6  21.3 - 08.6 %   Platelets 226  150 - 400 K/uL   Neutrophils Relative % 68  43 - 77 %   Lymphocytes Relative 20  12 - 46 %   Monocytes Relative 7  3 - 12 %   Eosinophils Relative 4  0 - 5 %   Basophils Relative 1  0 - 1 %   Neutro Abs 7.9 (*) 1.7 - 7.7 K/uL   Lymphs Abs 2.3  0.7 - 4.0 K/uL   Monocytes Absolute 0.8  0.1 - 1.0 K/uL   Eosinophils Absolute 0.5  0.0 - 0.7 K/uL   Basophils Absolute 0.1  0.0 - 0.1 K/uL   WBC Morphology TOXIC GRANULATION    BASIC METABOLIC PANEL      Result Value Range   Sodium 137  135 - 145 mEq/L   Potassium 3.8  3.5 - 5.1 mEq/L   Chloride 96  96 - 112 mEq/L   CO2 31  19 - 32 mEq/L   Glucose, Bld 138 (*) 70 - 99 mg/dL   BUN 13  6 - 23 mg/dL   Creatinine, Ser 5.78  0.50 - 1.35 mg/dL   Calcium 9.2  8.4 - 46.9 mg/dL   GFR calc non Af Amer 78 (*) >90 mL/min   GFR calc Af Amer >90  >90 mL/min  BASIC METABOLIC PANEL      Result Value Range   Sodium 135  135 - 145 mEq/L   Potassium 3.7  3.5 - 5.1 mEq/L   Chloride 94 (*) 96 - 112 mEq/L   CO2 35 (*) 19 - 32 mEq/L   Glucose, Bld 127 (*) 70 - 99 mg/dL   BUN 18  6 - 23 mg/dL   Creatinine, Ser 6.29  0.50 - 1.35 mg/dL   Calcium 9.1  8.4 - 52.8 mg/dL   GFR  calc non Af Amer 67 (*) >90 mL/min   GFR calc Af Amer 78 (*) >90 mL/min  PRO B NATRIURETIC PEPTIDE      Result Value Range   Pro B Natriuretic peptide (BNP) 685.7 (*) 0 - 450 pg/mL  CG4 I-STAT (LACTIC ACID)      Result Value Range   Lactic Acid, Venous 2.74 (*) 0.5 - 2.2 mmol/L  POCT I-STAT 3, BLOOD GAS (G3+)      Result Value Range   pH, Arterial 7.372  7.350 - 7.450   pCO2 arterial 37.5  35.0 - 45.0 mmHg   pO2, Arterial 138.0 (*) 80.0 - 100.0 mmHg   Bicarbonate 21.3  20.0 - 24.0 mEq/L   TCO2 22  0 - 100 mmol/L   O2 Saturation 99.0     Acid-base deficit 3.0 (*) 0.0 - 2.0 mmol/L   Patient temperature 102.3 F     Collection site RADIAL, ALLEN'S TEST ACCEPTABLE  Drawn by Operator     Sample type ARTERIAL      Time coordinating discharge: 42 minutes  Signed: Pearla Dubonnet, MD 12/01/2012, 3:24 AM

## 2012-12-01 NOTE — Progress Notes (Signed)
Physical Therapy Treatment Patient Details Name: Zachary Haas MRN: [id number] DOB: 1927-10-16 Today's Date: 12/01/2012 Time: 4098-1191 PT Time Calculation (min): 26 min  PT Assessment / Plan / Recommendation Comments on Treatment Session  Pt continues to make progress towards PT goals at this time. Patient O2 saturations decreased with ambulation on 2 liters to 83%, increased O2 to 3 liters, with multiple rest breaks patient rebounded well. Will continue to see as indicated to maxmize functional mobility.    Follow Up Recommendations  Home health PT     Does the patient have the potential to tolerate intense rehabilitation     Barriers to Discharge        Equipment Recommendations  None recommended by PT    Recommendations for Other Services    Frequency Min 3X/week   Plan Discharge plan remains appropriate    Precautions / Restrictions Precautions Precautions: Fall Restrictions Weight Bearing Restrictions: No   Pertinent Vitals/Pain No pain reported    Mobility  Bed Mobility Bed Mobility: Supine to Sit;Sitting - Scoot to Edge of Bed Supine to Sit: 5: Supervision Sitting - Scoot to Edge of Bed: 5: Supervision Details for Bed Mobility Assistance: No assist required today Transfers Transfers: Sit to Stand;Stand to Sit Sit to Stand: 5: Supervision Stand to Sit: 5: Supervision Details for Transfer Assistance: VCs for hand placement Ambulation/Gait Ambulation/Gait Assistance: 5: Supervision Ambulation Distance (Feet): 210 Feet Assistive device: Rolling walker Ambulation/Gait Assistance Details: VCs for upright posture and preset rest breaks approx every 50-60 ft. Patient O2 saturation dropped with ambulation to 83% on 2 liters, increased to 3 liters for remainder of walk. Patient rebounded well after approximately 1-2 minutes rest break each time. O2 varied between low 80s and 93%.  Patient steady with gait. Gait Pattern: Step-through pattern;Trunk flexed;Narrow base of  support Gait velocity: slightly decreased General Gait Details: steady but very dyspnic with increased WOB towards the end of ambulation Stairs: No    Exercises General Exercises - Lower Extremity Ankle Circles/Pumps: AROM;Both;10 reps Long Arc Quad: AROM;Both;10 reps Hip Flexion/Marching: AROM;Both;10 reps Heel Raises: AROM;Both;10 reps   PT Diagnosis:    PT Problem List:   PT Treatment Interventions:     PT Goals Acute Rehab PT Goals PT Goal Formulation: With patient Time For Goal Achievement: 12/04/12 Potential to Achieve Goals: Good Pt will go Supine/Side to Sit: with modified independence PT Goal: Supine/Side to Sit - Progress: Progressing toward goal Pt will go Sit to Supine/Side: with modified independence PT Goal: Sit to Supine/Side - Progress: Progressing toward goal Pt will go Sit to Stand: with modified independence PT Goal: Sit to Stand - Progress: Progressing toward goal Pt will go Stand to Sit: with modified independence PT Goal: Stand to Sit - Progress: Progressing toward goal Pt will Ambulate: 51 - 150 feet;with supervision;with least restrictive assistive device PT Goal: Ambulate - Progress: Met  Visit Information  Last PT Received On: 12/01/12 Assistance Needed: +1    Subjective Data  Subjective: I feel better today Patient Stated Goal: Return home   Cognition  Cognition Arousal/Alertness: Awake/alert Behavior During Therapy: WFL for tasks assessed/performed Overall Cognitive Status: Within Functional Limits for tasks assessed    Balance  Balance Balance Assessed: Yes Static Standing Balance Static Standing - Balance Support: Bilateral upper extremity supported Static Standing - Level of Assistance: 5: Stand by assistance  End of Session PT - End of Session Equipment Utilized During Treatment: Gait belt;Oxygen Activity Tolerance: Patient limited by fatigue Patient left: in chair;with  call bell/phone within reach;with family/visitor  present Nurse Communication: Mobility status   GP     Fabio Asa 12/01/2012, 10:02 AM

## 2012-12-01 NOTE — Progress Notes (Signed)
Pt d/c home with instructions, rx, and f/u appointments. Pt and wife verbalized understanding of instructions.

## 2012-12-01 NOTE — Progress Notes (Addendum)
O2 sat on room air at rest 86%, 2L O2 by [place] reapplied.  Will continue to monitor.

## 2012-12-18 ENCOUNTER — Ambulatory Visit (INDEPENDENT_AMBULATORY_CARE_PROVIDER_SITE_OTHER): Payer: Medicare Other | Admitting: General Surgery

## 2012-12-18 ENCOUNTER — Encounter (INDEPENDENT_AMBULATORY_CARE_PROVIDER_SITE_OTHER): Payer: Self-pay | Admitting: General Surgery

## 2012-12-18 VITALS — BP 112/60 | HR 74 | Temp 97.0°F | Resp 18 | Ht 68.0 in | Wt 222.6 lb

## 2012-12-18 DIAGNOSIS — Z09 Encounter for follow-up examination after completed treatment for conditions other than malignant neoplasm: Secondary | ICD-10-CM

## 2012-12-18 NOTE — Progress Notes (Signed)
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 He unfortunately had to be hospitalized due to sepsis from apparent residual necrotic tissue around the prostate. This improved and he is doing pretty well at home currently. He saw the enterostomal therapist and after a change in his device he and his wife state the colostomy is working "perfectly" without any leakage or difficulty with his bowel movements. He has no further feculent drainage from his urethra.  Exam: BP 112/60  Pulse 74  Temp(Src) 97 F (36.1 C) (Temporal)  Resp 18  Ht 5\' 8"  (1.727 m)  Wt 222 lb 9.6 oz (100.971 kg)  BMI 33.85 kg/m2 Generally alert and looks well. Abdomen: Soft and nontender. We took off the ostomy device and his stoma looks much better with the now fully healed mucosa and mild narrowing but functioning well.  Assessment and plan: The colostomy now is functioning well and he is happy with this. I will be available as needed.

## 2014-10-05 ENCOUNTER — Telehealth: Payer: Self-pay | Admitting: *Deleted

## 2014-10-05 NOTE — Telephone Encounter (Signed)
Per telephone call from Dr Inda Merlin who spoke with Dr Marlou Porch - pt is back in At Fib with a rate of appr 130 bpm.  Pt states he feels fine.  Dr Marlou Porch increased patient's Atenolol to 50 mg BID and pt is to f/u in 2 to 3 days.  He will be called with an appointment.

## 2014-10-05 NOTE — Telephone Encounter (Signed)
Pt has an appointment on Friday with Rhonda Barrett.

## 2014-10-08 ENCOUNTER — Encounter: Payer: Self-pay | Admitting: Physician Assistant

## 2014-10-08 ENCOUNTER — Ambulatory Visit (INDEPENDENT_AMBULATORY_CARE_PROVIDER_SITE_OTHER): Payer: Medicare Other | Admitting: Physician Assistant

## 2014-10-08 VITALS — BP 114/58 | HR 83 | Ht 68.0 in | Wt 212.0 lb

## 2014-10-08 DIAGNOSIS — I5022 Chronic systolic (congestive) heart failure: Secondary | ICD-10-CM

## 2014-10-08 MED ORDER — METOPROLOL TARTRATE 25 MG PO TABS: 37.5000 mg | ORAL_TABLET | Freq: Two times a day (BID) | ORAL | Status: DC

## 2014-10-08 NOTE — Patient Instructions (Addendum)
Change your metoprolol dosing to 25 mg, 1-1/2 tablets twice a day.  Continue your other medications as prescribed  If you have problems with being lightheaded or dizzy, call us back and we will have you come in for nurse visit with vital signs and an ECG.  Continue to track your weight daily.  We will schedule an echocardiogram to follow your heart muscle function.  Follow-up with Dr. Tamala Julian in 3 months. OR NEXT AVAILABLE APPT  Your physician has requested that you have an echocardiogram. Echocardiography is a painless test that uses sound waves to create images of your heart. It provides your doctor with information about the size and shape of your heart and how well your heart's chambers and valves are working. This procedure takes approximately one hour. There are no restrictions for this procedure.

## 2014-10-08 NOTE — Progress Notes (Signed)
Cardiology Office Note   Date:  10/08/2014   ID:  Zachary Haas, DOB 10-10-1927, MRN [id number]  PCP:  Zachary Screws, MD  Cardiologist:  Dr. Glennon Hamilton Barrett, PA-C   Atrial fibrillation  History of Present Illness: Zachary Haas is a 81 y.o. male with a history of paroxysmal atrial fibrillation, diastolic CHF and bypass surgery. He is on Eliquis for anticoagulation.  He was seen by Dr. Inda Merlin for routine visit and his heart rate was noted to be rapid, irregular and very fast. His heart rate at one point was greater than 130. The office was contacted who recommended increasing his metoprolol from 25 mg twice a day up to 50 mg twice a day. An office visit was arranged.  Zachary Haas has been tracking his blood pressure since his office visit. He increased the metoprolol as requested. His systolic blood pressure has been mainly in the 90s, with heart rates in the 50s and 60s.  Zachary Haas was asymptomatic with atrial fibrillation and a heart rate in the 130s. The only symptom that he describes is a brief feeling of being lightheaded when he stands up. This has been going on since he increased his beta blocker and cannot remember for sure if it was going on consistently before he saw Dr. Inda Merlin but it may have been.  He has had no chest pain, no change in his oxygen needs or dyspnea on exertion, no weight change, no lower extremity edema, and no palpitations. Except for occasional brief periods of orthostatic dizziness, he is asymptomatic.  Past Medical History  Diagnosis Date  . Personal history of other diseases of circulatory system   . Other and unspecified coagulation defects   . Depressive disorder, not elsewhere classified   . Pure hypercholesterolemia   . Radiotherapy   . Rectal fistula   . Personal history of tobacco use, presenting hazards to health     1-1 1/2 ppd for 50 years  . Malignant neoplasm of bladder, part unspecified   . Other chronic cystitis   .  Intestinovesical fistula   . Gross hematuria   . Malignant neoplasm of prostate   . Myocardial infarction 1992, 1998  . Aortic aneurysm 2008  . Hypertension   . Tendinitis   . Breathing problem     shallow/ chronic productive cough  . Bronchitis   . GERD (gastroesophageal reflux disease)   . Headache(784.0)     sinus  . Coronary artery disease   . Left bundle branch block   . Prostate cancer   . Bladder cancer    Past Surgical History  Procedure Laterality Date  . Colon surgery  1966    removed 6 " colon  . Colon surgery  1968    scar tissue  . Balloon dilation  1992    heart attack  . Angioplasty  1993  . Bypass graft  1998    heart attack  . Prostate ca  04/1997    Dr. Valere Dross  . Radiation seeding implant  05/1997  . Coloc cauterization  05/1998  . Bladder ca  08/2006  . Cystoscopy  1999    with biopsy x2  . Spt tube  2011  . Prostate surgery  1998    seed inplantation  radiation treatments  . Coronary artery bypass graft    . Cardiac catheterization    . Cataract extraction    . Laparoscopic diverted colostomy  08/04/2012    Procedure: LAPAROSCOPIC DIVERTED COLOSTOMY;  Surgeon: Edward Jolly, MD;  Location: [facility];  Service: General;  Laterality: N/A;  Laparoscopic Colostomy, surgery start time 13:24  . Cystostomy  08/04/2012    Procedure: CYSTOSTOMY SUPRAPUBIC;  Surgeon: Bernestine Amass, MD;  Location: [facility];  Service: Urology;  Laterality: N/A;  Cysto via Supra Pubic Tract and Insertion of New Suprapubic Tube, possible Bladder Biopsy, PROCEDURE START 1300, STOP 1314  . Cystoscopy N/A 10/22/2012    Procedure: CYSTOSCOPY;  Surgeon: Bernestine Amass, MD;  Location: [facility];  Service: Urology;  Laterality: N/A;  Flexible CYSTOSCOPY VIA SUPRAPUBIC TRACT, HOLMIUM LASER LITHOTRIPSY, NEW SUPRAPUBIC TUBE INSERTION   . Insertion of suprapubic catheter N/A 10/22/2012    Procedure: INSERTION OF SUPRAPUBIC CATHETER;  Surgeon: Bernestine Amass, MD;  Location: [facility];  Service:  Urology;  Laterality: N/A;  . Holmium laser application N/A 8/52/7782    Procedure: HOLMIUM LASER APPLICATION;  Surgeon: Bernestine Amass, MD;  Location: [facility];  Service: Urology;  Laterality: N/A;    Current Outpatient Prescriptions  Medication Sig Dispense Refill  . apixaban (ELIQUIS) 5 MG TABS tablet Take 1 tablet (5 mg total) by mouth 2 (two) times daily. 60 tablet 2  . buPROPion (WELLBUTRIN) 100 MG tablet Take 100 mg by mouth 2 (two) times daily.     . cefUROXime (CEFTIN) 500 MG tablet Take 1 tablet (500 mg total) by mouth 2 (two) times daily with a meal. 14 tablet 0  . citalopram (CELEXA) 20 MG tablet Take 0.5 tablets (10 mg total) by mouth every evening. (Patient taking differently: Take 10 mg by mouth every evening. ) 30 tablet 0  . Cranberry (SM CRANBERRY) 300 MG tablet Take 300 mg by mouth 3 (three) times daily.     Marland Kitchen dextromethorphan-guaiFENesin (MUCINEX DM) 30-600 MG per 12 hr tablet Take 2 tablets by mouth every 12 (twelve) hours.    . furosemide (LASIX) 80 MG tablet Take 1 tablet (80 mg total) by mouth 2 (two) times daily. 60 tablet 2  . isosorbide mononitrate (IMDUR) 30 MG 24 hr tablet Take 30 mg by mouth daily before breakfast.     . metoprolol tartrate (LOPRESSOR) 25 MG tablet Take 1 tablet (25 mg total) by mouth 2 (two) times daily. (Patient taking differently: Take 25 mg by mouth 2 (two) times daily. ) 60 tablet 0  . Multiple Vitamins-Minerals (MULTIVITAMIN WITH MINERALS) tablet Take 1 tablet by mouth daily.    . potassium chloride SA (K-DUR,KLOR-CON) 20 MEQ tablet Take 1 tablet (20 mEq total) by mouth 2 (two) times daily. 60 tablet 5  . psyllium (REGULOID) 0.52 G capsule Take 104 g by mouth 2 (two) times daily.    . simvastatin (ZOCOR) 40 MG tablet Take 40 mg by mouth daily before breakfast.      No current facility-administered medications for this visit.    Allergies:   Percocet; Ciprofloxacin; Influenza vac split; Tape; and Latex   Social History:  The patient  reports  that he quit smoking about 18 years ago. His smoking use included Cigarettes. He quit after 50 years of use. He has never used smokeless tobacco. He reports that he does not drink alcohol or use illicit drugs.   Family History:  The patient's family history includes Cancer in his father; Stroke in his mother.   ROS:  Please see the history of present illness. All other systems are reviewed and negative.   PHYSICAL EXAM: VS:  BP 114/58 mmHg  Pulse 83  Ht 5'  8" (1.727 m)  Wt 212 lb (96.163 kg)  BMI 32.24 kg/m2 , BMI Body mass index is 32.24 kg/(m^2). GEN: Well nourished, well developed, in no acute distress HEENT: normal Neck: no JVD, carotid bruits, or masses Cardiac: RRR; no murmurs, rubs, or gallops,no edema  Respiratory:  clear to auscultation bilaterally, normal work of breathing GI: soft, nontender, nondistended, + BS MS: no deformity or atrophy Skin: warm and dry, no rash Neuro:  Strength and sensation are intact Psych: euthymic mood, full affect   EKG:  EKG is ordered today. The ekg ordered today demonstrates atrial fibrillation, controlled ventricular rate at 83. Right bundle branch block is old   Recent Labs: No results found for requested labs within last 365 days.    Lipid Panel    Component Value Date/Time   CHOL  09/13/2008 0455    137        ATP III CLASSIFICATION:  <200     mg/dL   Desirable  200-239  mg/dL   Borderline High  >=240    mg/dL   High          TRIG 70 09/13/2008 0455   HDL 35* 09/13/2008 0455   CHOLHDL 3.9 09/13/2008 0455   VLDL 14 09/13/2008 0455   LDLCALC 88 09/13/2008 0455     Wt Readings from Last 3 Encounters:  10/08/14 212 lb (96.163 kg)  12/18/12 222 lb 9.6 oz (100.971 kg)  11/27/12 228 lb 6.3 oz (103.6 kg)     Other studies Reviewed: Additional studies/ records that were reviewed today include: Previous ECGs, office notes. Review of the above records demonstrates: History of atrial fibrillation and diastolic CHF weight is  at/below baseline   ASSESSMENT AND PLAN:  1.  Atrial fibrillation, RVR: He was unaware of his rapid rate. When he was seen by Dr. Inda Merlin his beta blocker was increased and he has been compliant with this. However, his blood pressure is low enough that he may be having problems with orthostatic hypotension. His heart rate is controlled and on the low side of normal at home, at rest. Discussed the situation with the family and advised that he should probably try and intermediate dose of metoprolol. Therefore, will change the metoprolol to 25 mg, 1-1/2 tablets twice a day. This was reviewed with the patient and his wife. A prescription for this was sent into his pharmacy.  2. Chronic anticoagulation: He is compliant with his Eliquis and not having any bleeding issues.  3. Remote history of prostate cancer: This is the medical problem that is giving him the most symptoms and is the most bothersome to him. He talks about it frequently. He is irritated, frustrated and possibly a little depressed about it.   4. Deconditioning: He describes a decreasing ability to ambulate. Advised him to discuss this with Dr. Inda Merlin as he may need home health PT.   Current medicines are reviewed at length with the patient today.  The patient does not have concerns regarding medicines.  The following changes have been made:  Change metoprolol to 37.5 mg twice a day, continue to follow heart rate and blood pressure.  Labs/ tests ordered today include:  No orders of the defined types were placed in this encounter.    Disposition:   FU with Dr. Tamala Julian in 3 months   Signed, Rosaria Ferries, PA-C  10/08/2014 12:16 PM    [address], [place], [place]  [address] Phone: [phone]; Fax: [phone]     

## 2014-10-08 NOTE — Progress Notes (Deleted)
Cardiology Office Note   Date:  10/08/2014   ID:  Zachary Haas, DOB [birth year], MRN [id number]  PCP:  Henrine Screws, MD  Cardiologist:  Suanne Marker Barrett, PA-C   No chief complaint on file.   History of Present Illness: Zachary Haas is a 81 y.o. male with a history of ***  He presents for ***   Past Medical History  Diagnosis Date  . Personal history of other diseases of circulatory system   . Other and unspecified coagulation defects   . Depressive disorder, not elsewhere classified   . Pure hypercholesterolemia   . Radiotherapy   . Rectal fistula   . Personal history of tobacco use, presenting hazards to health     1-1 1/2 ppd for 50 years  . Malignant neoplasm of bladder, part unspecified   . Other chronic cystitis   . Intestinovesical fistula   . Gross hematuria   . Malignant neoplasm of prostate   . Myocardial infarction 1992, 1998  . Aortic aneurysm 2008  . Hypertension   . Tendinitis   . Breathing problem     shallow/ chronic productive cough  . Bronchitis   . GERD (gastroesophageal reflux disease)   . Headache(784.0)     sinus  . Coronary artery disease   . Left bundle branch block   . Prostate cancer   . Bladder cancer     Past Surgical History  Procedure Laterality Date  . Colon surgery  1966    removed 6 " colon  . Colon surgery  1968    scar tissue  . Balloon dilation  1992    heart attack  . Angioplasty  1993  . Bypass graft  1998    heart attack  . Prostate ca  04/1997    Dr. Valere Dross  . Radiation seeding implant  05/1997  . Coloc cauterization  05/1998  . Bladder ca  08/2006  . Cystoscopy  1999    with biopsy x2  . Spt tube  2011  . Prostate surgery  1998    seed inplantation  radiation treatments  . Coronary artery bypass graft    . Cardiac catheterization    . Cataract extraction    . Laparoscopic diverted colostomy  08/04/2012    Procedure: LAPAROSCOPIC DIVERTED COLOSTOMY;  Surgeon: Edward Jolly, MD;   Location: [facility];  Service: General;  Laterality: N/A;  Laparoscopic Colostomy, surgery start time 13:24  . Cystostomy  08/04/2012    Procedure: CYSTOSTOMY SUPRAPUBIC;  Surgeon: Bernestine Amass, MD;  Location: [facility];  Service: Urology;  Laterality: N/A;  Cysto via Supra Pubic Tract and Insertion of New Suprapubic Tube, possible Bladder Biopsy, PROCEDURE START 1300, STOP 1314  . Cystoscopy N/A 10/22/2012    Procedure: CYSTOSCOPY;  Surgeon: Bernestine Amass, MD;  Location: [facility];  Service: Urology;  Laterality: N/A;  Flexible CYSTOSCOPY VIA SUPRAPUBIC TRACT, HOLMIUM LASER LITHOTRIPSY, NEW SUPRAPUBIC TUBE INSERTION   . Insertion of suprapubic catheter N/A 10/22/2012    Procedure: INSERTION OF SUPRAPUBIC CATHETER;  Surgeon: Bernestine Amass, MD;  Location: [facility];  Service: Urology;  Laterality: N/A;  . Holmium laser application N/A 10/11/8784    Procedure: HOLMIUM LASER APPLICATION;  Surgeon: Bernestine Amass, MD;  Location: [facility];  Service: Urology;  Laterality: N/A;    Current Outpatient Prescriptions  Medication Sig Dispense Refill  . apixaban (ELIQUIS) 5 MG TABS tablet Take 1 tablet (5 mg total) by mouth 2 (two) times  daily. 60 tablet 2  . buPROPion (WELLBUTRIN) 100 MG tablet Take 100 mg by mouth 2 (two) times daily.     . cefUROXime (CEFTIN) 500 MG tablet Take 1 tablet (500 mg total) by mouth 2 (two) times daily with a meal. 14 tablet 0  . citalopram (CELEXA) 20 MG tablet Take 0.5 tablets (10 mg total) by mouth every evening. (Patient taking differently: Take 10 mg by mouth every evening. ) 30 tablet 0  . Cranberry (SM CRANBERRY) 300 MG tablet Take 300 mg by mouth 3 (three) times daily.     Marland Kitchen dextromethorphan-guaiFENesin (MUCINEX DM) 30-600 MG per 12 hr tablet Take 2 tablets by mouth every 12 (twelve) hours.    . furosemide (LASIX) 80 MG tablet Take 1 tablet (80 mg total) by mouth 2 (two) times daily. 60 tablet 2  . isosorbide mononitrate (IMDUR) 30 MG 24 hr tablet Take 30 mg by mouth daily before  breakfast.     . metoprolol tartrate (LOPRESSOR) 25 MG tablet Take 1 tablet (25 mg total) by mouth 2 (two) times daily. (Patient taking differently: Take 25 mg by mouth 2 (two) times daily. ) 60 tablet 0  . Multiple Vitamins-Minerals (MULTIVITAMIN WITH MINERALS) tablet Take 1 tablet by mouth daily.    . potassium chloride SA (K-DUR,KLOR-CON) 20 MEQ tablet Take 1 tablet (20 mEq total) by mouth 2 (two) times daily. 60 tablet 5  . psyllium (REGULOID) 0.52 G capsule Take 104 g by mouth 2 (two) times daily.    . simvastatin (ZOCOR) 40 MG tablet Take 40 mg by mouth daily before breakfast.      No current facility-administered medications for this visit.    Allergies:   Percocet; Ciprofloxacin; Influenza vac split; Tape; and Latex    Social History:  The patient  reports that he quit smoking about 18 years ago. His smoking use included Cigarettes. He quit after 50 years of use. He has never used smokeless tobacco. He reports that he does not drink alcohol or use illicit drugs.   Family History:  The patient's family history includes Cancer in his father; Stroke in his mother.    ROS:  Please see the history of present illness. All other systems are reviewed and negative.    PHYSICAL EXAM: VS:  BP 114/58 mmHg  Pulse 83  Ht 5\' 8"  (1.727 m)  Wt 212 lb (96.163 kg)  BMI 32.24 kg/m2 , BMI Body mass index is 32.24 kg/(m^2). GEN: Well nourished, well developed, in no acute distress HEENT: normal Neck: no JVD, carotid bruits, or masses Cardiac: RRR; no murmurs, rubs, or gallops,no edema  Respiratory:  clear to auscultation bilaterally, normal work of breathing GI: soft, nontender, nondistended, + BS MS: no deformity or atrophy Skin: warm and dry, no rash Neuro:  Strength and sensation are intact Psych: euthymic mood, full affect   EKG:  EKG {ACTION; IS/IS [id number]:[id number]} ordered today. The ekg ordered today demonstrates ***   Recent Labs: No results found for requested labs within last 365  days.    Lipid Panel    Component Value Date/Time   CHOL  09/13/2008 0455    137        ATP III CLASSIFICATION:  <200     mg/dL   Desirable  200-239  mg/dL   Borderline High  >=240    mg/dL   High          TRIG 70 09/13/2008 0455   HDL 35* 09/13/2008 0455   CHOLHDL 3.9  09/13/2008 0455   VLDL 14 09/13/2008 0455   LDLCALC  09/13/2008 0455    88        Total Cholesterol/HDL:CHD Risk Coronary Heart Disease Risk Table                     Men   Women  1/2 Average Risk   3.4   3.3  Average Risk       5.0   4.4  2 X Average Risk   9.6   7.1  3 X Average Risk  23.4   11.0        Use the calculated Patient Ratio above and the CHD Risk Table to determine the patient's CHD Risk.        ATP III CLASSIFICATION (LDL):  <100     mg/dL   Optimal  100-129  mg/dL   Near or Above                    Optimal  130-159  mg/dL   Borderline  160-189  mg/dL   High  >190     mg/dL   Very High     Wt Readings from Last 3 Encounters:  10/08/14 212 lb (96.163 kg)  12/18/12 222 lb 9.6 oz (100.971 kg)  11/27/12 228 lb 6.3 oz (103.6 kg)     Other studies Reviewed: Additional studies/ records that were reviewed today include: ***. Review of the above records demonstrates: ***   ASSESSMENT AND PLAN:  1.  ***   Current medicines are reviewed at length with the patient today.  The patient {ACTIONS; HAS/DOES NOT HAVE:19233} concerns regarding medicines.  The following changes have been made:  {PLAN; NO CHANGE:13088:s}  Labs/ tests ordered today include: *** No orders of the defined types were placed in this encounter.     Disposition:   FU with *** in {gen number 1-79:[id number]} {TIME; UNITS DAY/WEEK/MONTH:19136}   Signed, Rosaria Ferries, PA-C  10/08/2014 11:52 AM    [facility] [address], [place], [place]  [address] Phone: [phone]; Fax: [phone]

## 2014-10-13 ENCOUNTER — Ambulatory Visit (HOSPITAL_COMMUNITY): Payer: Medicare Other | Attending: Internal Medicine | Admitting: Cardiology

## 2014-10-13 DIAGNOSIS — I509 Heart failure, unspecified: Secondary | ICD-10-CM

## 2014-10-13 DIAGNOSIS — I5022 Chronic systolic (congestive) heart failure: Secondary | ICD-10-CM | POA: Diagnosis not present

## 2014-10-13 NOTE — Progress Notes (Signed)
Echo performed. 

## 2014-11-01 ENCOUNTER — Telehealth: Payer: Self-pay | Admitting: Interventional Cardiology

## 2014-11-01 DIAGNOSIS — I4819 Other persistent atrial fibrillation: Secondary | ICD-10-CM

## 2014-11-01 MED ORDER — APIXABAN 5 MG PO TABS: 5.0000 mg | ORAL_TABLET | Freq: Two times a day (BID) | ORAL | Status: DC

## 2014-11-01 NOTE — Telephone Encounter (Signed)
New Message   Patients wife is calling and wanting to know results of the test that were taken on the patient back in April. Please give them a call back.

## 2014-11-01 NOTE — Telephone Encounter (Signed)
Echo was ordered at office visit with Rosaria Ferries, PA. It has not been commented on by PA or MD.  I spoke with pt's wife and told her I would send message to Dr. Tamala Julian to review echo and call her after it was reviewed.   Pt's wife states she has not received any guidelines as to what she should expect.  No specific questions but would like to know treatment plan.  Was given Eliquis samples by Dr. Inda Merlin.  Needs prescription sent to [facility].  Will send in.  Wife made aware to contact our office to send prescription to local pharmacy if pt does not have enough Eliquis to last until shipment from mail order comes in.

## 2014-11-02 ENCOUNTER — Other Ambulatory Visit: Payer: Self-pay | Admitting: *Deleted

## 2014-11-02 MED ORDER — APIXABAN 5 MG PO TABS: 5.0000 mg | ORAL_TABLET | Freq: Two times a day (BID) | ORAL | Status: DC

## 2014-11-02 NOTE — Telephone Encounter (Signed)
Let the patient and his wife know I was not aware that he had seen [place] PA. The echo does not demonstrate any significant problems. He need to be on permanent anticoagulation therapy. They need a 48 hour Holter monitor. He needs to see me in the office shortly after the Holter has been completed to discuss management strategy.

## 2014-11-02 NOTE — Telephone Encounter (Signed)
Pt wife aware of echo results and Dr.Smith's recommendation. Pt wife adv Dr.Smith  was not aware that pt had seen [place] PA. The echo does not demonstrate any significant problems. He need to be on permanent anticoagulation therapy. pt needs a 48 hour Holter monitor. Pt needs to see Dr.Smith in the office shortly after the Holter has been completed to discuss management strategy. Pt wife had several questions that were addressed Adv pt a scheduler form our office will call him to schedule. Pt wife appreciative for the call and verbalized understanding

## 2014-11-04 ENCOUNTER — Other Ambulatory Visit: Payer: Self-pay | Admitting: *Deleted

## 2014-11-04 MED ORDER — APIXABAN 5 MG PO TABS: 5.0000 mg | ORAL_TABLET | Freq: Two times a day (BID) | ORAL | Status: DC

## 2014-11-08 ENCOUNTER — Ambulatory Visit (INDEPENDENT_AMBULATORY_CARE_PROVIDER_SITE_OTHER): Payer: Medicare Other

## 2014-11-08 DIAGNOSIS — I4819 Other persistent atrial fibrillation: Secondary | ICD-10-CM

## 2014-11-08 DIAGNOSIS — I481 Persistent atrial fibrillation: Secondary | ICD-10-CM | POA: Diagnosis not present

## 2014-11-14 NOTE — Telephone Encounter (Signed)
Let him know I have read the Holter. He needs to start Digoxin .0625 mg daily. Will further discuss at [place].

## 2014-11-15 MED ORDER — METOPROLOL TARTRATE 25 MG PO TABS: 25.0000 mg | ORAL_TABLET | Freq: Two times a day (BID) | ORAL | Status: DC

## 2014-11-15 MED ORDER — DIGOXIN 125 MCG PO TABS: 0.0625 mg | ORAL_TABLET | Freq: Every day | ORAL | Status: DC

## 2014-11-15 NOTE — Telephone Encounter (Signed)
Pt aware of Dr.Smith's recommendation Reduce Metoprolol to 25mg  bid Start Digoxin .0625mg  daily. An Rx has been sent to pt pharmacy appt with Dr.Smith moved up to 5/24 @ 4pm. Pt wife verbalize understanding.

## 2014-11-15 NOTE — Telephone Encounter (Signed)
Pt and pt wife aware holter results and Dr.Smith's recommendations.  Atrial fibrillation present 75% of time  Average HR 80 bpm  Peak heart rate 129 bpm  Rare PVC's He needs to start Digoxin .0625 mg daily. Will further discuss at [place]. Pt appt moved up to 6/17  Pt wife sts that pt bp has been running low the last couple of weeks. Pt is dizzy,lightheaded, and fatigued when his systolic bp is under 203. Pt lowest bp last wk was 89/54. Pt bp today at 12pm was 97/51 63bpm. Adv her that I will update Dr.Smith and call back with his recommendation. She verbalized understanding.

## 2014-11-22 ENCOUNTER — Telehealth: Payer: Self-pay | Admitting: *Deleted

## 2014-11-22 ENCOUNTER — Telehealth: Payer: Self-pay | Admitting: Interventional Cardiology

## 2014-11-22 ENCOUNTER — Telehealth (HOSPITAL_COMMUNITY): Payer: Self-pay | Admitting: Vascular Surgery

## 2014-11-22 NOTE — Telephone Encounter (Signed)
New message   Daughter calling has some question regarding tomorrow appt @ 4 pm . Will discuss when you call back

## 2014-11-22 NOTE — Telephone Encounter (Signed)
Returned pt daughter call. She reports that pt had an CT on Fri ordered by urology. Pt was original dx with an an abn aneurysm back in 2011. They were told that the aneurysm has grown and pt needs to be ref to a Psychologist, sport and exercise. Pt is not aware of the diagnosis. He has been emotionally unstable. Pt daughter would like Dr.Smith to review the Ct disk prior to pt o/v on 5/24 @ 4pm. She will drop off the disc today. Adv her that Dr.Smith is not in the office today. She can leave it at the front desk and they will get it to me. She wanted to know if pt should hold medications for his appt tomorrow. Adv her that it was not neccessary. She verbalized understanding.

## 2014-11-22 NOTE — Telephone Encounter (Signed)
Zachary Haas's daughter, Zachary Haas, called triage after getting a call from Dr. Cy Blamer office regarding enlargement of patient's AAA. He had a CT on 11-18-14 as followup for hematuria and bladder cancer history. This CT was evidently compared to a CTA done 09-16-2007 per Dr. Scot Dock following up on the AAA. We have not seen the patient since 2010 when he had a AAA duplex in our office ( the AAA was 4.4cm in maximum diameter). According to [name], the patient has not had any abdominal or back pain and his hematuria has stopped. I told her that I would review this new CT with Dr. Scot Dock on Wednesday when he is in the office; he is in surgery all day tomorrow. I also discussed the S&S of AAA rupture and instructed her to call 911 if [name] had any of these. She said that he was to see Dr. Tamala Julian tomorrow for holter monitor followup. Daughter voiced understanding and agreement of this plan. I will call her back on Wednesday after review with Dr. Scot Dock.

## 2014-11-22 NOTE — Telephone Encounter (Signed)
Received a referral from [facility] for AAA (asap appointment)-   I attempted to schedule an appointment with Zachary Haas this morning, his wife stated Zachary Haas will be seeing Dr. Tamala Julian 05/24 at 4pm.   She will have Dr. Tamala Julian review the CT results and let him determine if a referral is needed.   I relayed this message with Bethena Roys at [facility].

## 2014-11-23 ENCOUNTER — Encounter: Payer: Self-pay | Admitting: Interventional Cardiology

## 2014-11-23 ENCOUNTER — Ambulatory Visit (INDEPENDENT_AMBULATORY_CARE_PROVIDER_SITE_OTHER): Payer: Medicare Other | Admitting: Interventional Cardiology

## 2014-11-23 VITALS — BP 100/68 | HR 99 | Ht 68.5 in | Wt 228.0 lb

## 2014-11-23 DIAGNOSIS — I483 Typical atrial flutter: Secondary | ICD-10-CM

## 2014-11-23 DIAGNOSIS — I10 Essential (primary) hypertension: Secondary | ICD-10-CM

## 2014-11-23 DIAGNOSIS — I447 Left bundle-branch block, unspecified: Secondary | ICD-10-CM

## 2014-11-23 DIAGNOSIS — I719 Aortic aneurysm of unspecified site, without rupture: Secondary | ICD-10-CM

## 2014-11-23 DIAGNOSIS — Z951 Presence of aortocoronary bypass graft: Secondary | ICD-10-CM

## 2014-11-23 LAB — BASIC METABOLIC PANEL
BUN: 16 mg/dL (ref 6–23)
CHLORIDE: 99 meq/L (ref 96–112)
CO2: 29 meq/L (ref 19–32)
CREATININE: 1.26 mg/dL (ref 0.50–1.35)
Calcium: 9.6 mg/dL (ref 8.4–10.5)
GLUCOSE: 99 mg/dL (ref 70–99)
Potassium: 3.7 mEq/L (ref 3.5–5.3)
SODIUM: 139 meq/L (ref 135–145)

## 2014-11-23 LAB — DIGOXIN LEVEL: Digoxin Level: 0.3 ng/mL — ABNORMAL LOW (ref 0.8–2.0)

## 2014-11-23 MED ORDER — DIGOXIN 125 MCG PO TABS: 0.1250 mg | ORAL_TABLET | Freq: Every day | ORAL | Status: AC

## 2014-11-23 NOTE — Progress Notes (Addendum)
Cardiology Office Note   Date:  11/23/2014   ID:  Zachary Haas, DOB [birth year], MRN [id number]  PCP:  Henrine Screws, MD  Cardiologist:  Sinclair Grooms, MD   Chief Complaint  Patient presents with  . ATRIAL FLUTTER  . ACUTE DIASTOLIC CHF      History of Present Illness: Zachary Haas is a 81 y.o. male who presents for atrial fibrillation, diastolic heart failure, chronic respiratory failure, chronic O2 therapy.  This Zachary Haas on chronic O2 now for greater than 2 years. At some point over the past several months he has developed atrial fibrillation. He is on medications for rate control. He is somewhat depressed. He is short of breath. He has decreased energy. He is tolerating the current medication regimen. Family feels his level of consciousness is decreased. He seems more aggravated now than previous.  Daughter and wife concerned about increased size of AAA on recent CT for which we do not have a report. Also concerned by a change in personality with more agitation.  Past Medical History  Diagnosis Date  . Personal history of other diseases of circulatory system   . Other and unspecified coagulation defects   . Depressive disorder, not elsewhere classified   . Pure hypercholesterolemia   . Radiotherapy   . Rectal fistula   . Personal history of tobacco use, presenting hazards to health     1-1 1/2 ppd for 50 years  . Malignant neoplasm of bladder, part unspecified   . Other chronic cystitis   . Intestinovesical fistula   . Gross hematuria   . Malignant neoplasm of prostate   . Myocardial infarction 1992, 1998  . Aortic aneurysm 2008  . Hypertension   . Tendinitis   . Breathing problem     shallow/ chronic productive cough  . Bronchitis   . GERD (gastroesophageal reflux disease)   . Headache(784.0)     sinus  . Coronary artery disease   . Left bundle branch block   . Prostate cancer   . Bladder cancer     Past Surgical History  Procedure Laterality  Date  . Colon surgery  1966    removed 6 " colon  . Colon surgery  1968    scar tissue  . Balloon dilation  1992    heart attack  . Angioplasty  1993  . Bypass graft  1998    heart attack  . Prostate ca  04/1997    Dr. Valere Dross  . Radiation seeding implant  05/1997  . Coloc cauterization  05/1998  . Bladder ca  08/2006  . Cystoscopy  1999    with biopsy x2  . Spt tube  2011  . Prostate surgery  1998    seed inplantation  radiation treatments  . Coronary artery bypass graft    . Cardiac catheterization    . Cataract extraction    . Laparoscopic diverted colostomy  08/04/2012    Procedure: LAPAROSCOPIC DIVERTED COLOSTOMY;  Surgeon: Edward Jolly, MD;  Location: [facility];  Service: General;  Laterality: N/A;  Laparoscopic Colostomy, surgery start time 13:24  . Cystostomy  08/04/2012    Procedure: CYSTOSTOMY SUPRAPUBIC;  Surgeon: Bernestine Amass, MD;  Location: [facility];  Service: Urology;  Laterality: N/A;  Cysto via Supra Pubic Tract and Insertion of New Suprapubic Tube, possible Bladder Biopsy, PROCEDURE START 1300, STOP 1314  . Cystoscopy N/A 10/22/2012    Procedure: CYSTOSCOPY;  Surgeon: Bernestine Amass, MD;  Location: [facility];  Service: Urology;  Laterality: N/A;  Flexible CYSTOSCOPY VIA SUPRAPUBIC TRACT, HOLMIUM LASER LITHOTRIPSY, NEW SUPRAPUBIC TUBE INSERTION   . Insertion of suprapubic catheter N/A 10/22/2012    Procedure: INSERTION OF SUPRAPUBIC CATHETER;  Surgeon: Bernestine Amass, MD;  Location: [facility];  Service: Urology;  Laterality: N/A;  . Holmium laser application N/A 9/39/0300    Procedure: HOLMIUM LASER APPLICATION;  Surgeon: Bernestine Amass, MD;  Location: [facility];  Service: Urology;  Laterality: N/A;     Current Outpatient Prescriptions  Medication Sig Dispense Refill  . apixaban (ELIQUIS) 5 MG TABS tablet Take 1 tablet (5 mg total) by mouth 2 (two) times daily. 12 tablet 0  . buPROPion (WELLBUTRIN) 100 MG tablet Take 100 mg by mouth 2 (two) times daily.     . cefUROXime  (CEFTIN) 500 MG tablet Take 1 tablet (500 mg total) by mouth 2 (two) times daily with a meal. 14 tablet 0  . citalopram (CELEXA) 20 MG tablet Take 0.5 tablets (10 mg total) by mouth every evening. (Patient taking differently: Take 10 mg by mouth every evening. ) 30 tablet 0  . Cranberry 300 MG tablet Take 300 mg by mouth 3 (three) times daily.    Marland Kitchen dextromethorphan-guaiFENesin (MUCINEX DM) 30-600 MG per 12 hr tablet Take 2 tablets by mouth every 12 (twelve) hours.    . digoxin (LANOXIN) 0.125 MG tablet Take 0.5 tablets (0.0625 mg total) by mouth daily. 15 tablet 5  . furosemide (LASIX) 80 MG tablet Take 1 tablet (80 mg total) by mouth 2 (two) times daily. 60 tablet 2  . metoprolol tartrate (LOPRESSOR) 25 MG tablet Take 1 tablet (25 mg total) by mouth 2 (two) times daily.    . Multiple Vitamins-Minerals (MULTIVITAMIN WITH MINERALS) tablet Take 1 tablet by mouth daily.    . potassium chloride SA (K-DUR,KLOR-CON) 20 MEQ tablet Take 1 tablet (20 mEq total) by mouth 2 (two) times daily. 60 tablet 5  . simvastatin (ZOCOR) 40 MG tablet Take 40 mg by mouth daily before breakfast.      No current facility-administered medications for this visit.    Allergies:   Influenza vac split; Percocet; Tape; Ciprofloxacin; and Latex    Social History:  The patient  reports that he quit smoking about 18 years ago. His smoking use included Cigarettes. He quit after 50 years of use. He has never used smokeless tobacco. He reports that he does not drink alcohol or use illicit drugs.   Family History:  The patient's family history includes Cancer in his father; Stroke in his mother.    ROS:  Please see the history of present illness.   Otherwise, review of systems are positive for none.   All other systems are reviewed and negative.    PHYSICAL EXAM: VS:  BP 100/68 mmHg  Pulse 99  Ht 5' 8.5" (1.74 m)  Wt 228 lb (103.42 kg)  BMI 34.16 kg/m2 , BMI Body mass index is 34.16 kg/(m^2). GEN: Well nourished, well  developed, in no acute distress HEENT: normal Neck: no JVD, carotid bruits, or masses Cardiac: RRR; no murmurs, rubs, or gallops,no edema  Respiratory:  clear to auscultation bilaterally, normal work of breathing GI: soft, nontender, nondistended, + BS MS: no deformity or atrophy Skin: warm and dry, no rash Neuro:  Strength and sensation are intact Psych: euthymic mood, full affect   EKG:  EKG is ordered today. The ekg ordered today demonstrates atrial fibrillation, right bundle branch block, left axis deviation,  poor rate control at 99 bpm   Recent Labs: No results found for requested labs within last 365 days.    Lipid Panel    Component Value Date/Time   CHOL  09/13/2008 0455    137        ATP III CLASSIFICATION:  <200     mg/dL   Desirable  200-239  mg/dL   Borderline High  >=240    mg/dL   High          TRIG 70 09/13/2008 0455   HDL 35* 09/13/2008 0455   CHOLHDL 3.9 09/13/2008 0455   VLDL 14 09/13/2008 0455   LDLCALC  09/13/2008 0455    88        Total Cholesterol/HDL:CHD Risk Coronary Heart Disease Risk Table                     Men   Women  1/2 Average Risk   3.4   3.3  Average Risk       5.0   4.4  2 X Average Risk   9.6   7.1  3 X Average Risk  23.4   11.0        Use the calculated Patient Ratio above and the CHD Risk Table to determine the patient's CHD Risk.        ATP III CLASSIFICATION (LDL):  <100     mg/dL   Optimal  100-129  mg/dL   Near or Above                    Optimal  130-159  mg/dL   Borderline  160-189  mg/dL   High  >190     mg/dL   Very High      Wt Readings from Last 3 Encounters:  11/23/14 228 lb (103.42 kg)  10/08/14 212 lb (96.163 kg)  12/18/12 222 lb 9.6 oz (100.971 kg)      Other studies Reviewed: Additional studies/ records that were reviewed today include: . Review of the above records demonstrates:    ASSESSMENT AND PLAN:  S/P CABG x 4 - denies angina  Essential hypertension - relatively low  Typical atrial  fibrillation/ flutter - rhythm now seems to be atrial fibrillation with poor rate control.  Aortic aneurysm - ? greater than 5 cm abdominal aortic aneurysm on recent CT scan at [facility].We need to official report. Family has also notified Dr. Nicole Cella office.    Current medicines are reviewed at length with the patient today.  The patient does not have concerns regarding medicines.  The following changes have been made:  Because her relatively low blood pressure we will discontinue isosorbide. I will increase digoxin to 0.125 mg per day. We will check a basic metabolic panel today and dig level. If he is prerenal, we will decrease his diarrhetic regimen. We discussed further management of atrial fibrillation with rhythm control via antiarrhythmic therapy and cardioversion. Patient wants less aggressive therapy.  Labs/ tests ordered today include:   Orders Placed This Encounter  Procedures  . Basic metabolic panel  . Digoxin level  . EKG 12-Lead     Disposition:   FU with HS in 1 month  Signed, Sinclair Grooms, MD  11/23/2014 5:37 PM    [address], [place], [place]  [address] Phone: [phone]; Fax: [phone]

## 2014-11-23 NOTE — Patient Instructions (Signed)
Medication Instructions:  Your physician has recommended you make the following change in your medication:  STOP Isosorbide INCREASE Digoxin to 0.125mg  daily   Labwork: Bmet,Dig Level today  Testing/Procedures: None   Follow-Up: You have a follow up appointment on 01/11/15 @3 :15pm  Any Other Special Instructions Will Be Listed Below (If Applicable).

## 2014-11-24 ENCOUNTER — Telehealth: Payer: Self-pay | Admitting: *Deleted

## 2014-11-24 ENCOUNTER — Other Ambulatory Visit: Payer: Self-pay | Admitting: *Deleted

## 2014-11-24 DIAGNOSIS — I714 Abdominal aortic aneurysm, without rupture, unspecified: Secondary | ICD-10-CM

## 2014-11-24 NOTE — Telephone Encounter (Signed)
Reviewed patient's CT with Dr. Scot Dock and he wants to get CTA to better define this AAA enlargement. I spoke with Daughter and she is in agreement with this plan. Patient's labwork from yesterday shows BUN and Creatinine within normal limits.

## 2014-11-26 ENCOUNTER — Encounter: Payer: Self-pay | Admitting: Vascular Surgery

## 2014-12-01 ENCOUNTER — Other Ambulatory Visit: Payer: Medicare Other

## 2014-12-01 ENCOUNTER — Ambulatory Visit: Payer: Medicare Other | Admitting: Vascular Surgery

## 2014-12-06 ENCOUNTER — Telehealth: Payer: Self-pay

## 2014-12-06 NOTE — Telephone Encounter (Signed)
-----   Message from Belva Crome, MD sent at 12/01/2014  1:12 PM EDT ----- Labs are okay. No dehydration. Dig level was low so increase in dose is was appropriate

## 2014-12-06 NOTE — Telephone Encounter (Signed)
Patient assistance form for Eliquis mailed to pt. @pt  wife request

## 2014-12-17 ENCOUNTER — Ambulatory Visit: Payer: Medicare Other | Admitting: Interventional Cardiology

## 2015-01-11 ENCOUNTER — Ambulatory Visit (INDEPENDENT_AMBULATORY_CARE_PROVIDER_SITE_OTHER): Payer: Medicare Other | Admitting: Interventional Cardiology

## 2015-01-11 ENCOUNTER — Encounter: Payer: Self-pay | Admitting: Interventional Cardiology

## 2015-01-11 ENCOUNTER — Ambulatory Visit: Payer: Medicare Other | Admitting: Interventional Cardiology

## 2015-01-11 VITALS — BP 98/52 | HR 62 | Ht 68.5 in | Wt 225.6 lb

## 2015-01-11 DIAGNOSIS — I5032 Chronic diastolic (congestive) heart failure: Secondary | ICD-10-CM | POA: Diagnosis not present

## 2015-01-11 DIAGNOSIS — Z951 Presence of aortocoronary bypass graft: Secondary | ICD-10-CM | POA: Diagnosis not present

## 2015-01-11 DIAGNOSIS — I447 Left bundle-branch block, unspecified: Secondary | ICD-10-CM

## 2015-01-11 DIAGNOSIS — I10 Essential (primary) hypertension: Secondary | ICD-10-CM

## 2015-01-11 DIAGNOSIS — I483 Typical atrial flutter: Secondary | ICD-10-CM

## 2015-01-11 DIAGNOSIS — I719 Aortic aneurysm of unspecified site, without rupture: Secondary | ICD-10-CM

## 2015-01-11 MED ORDER — METOPROLOL TARTRATE 25 MG PO TABS: 12.5000 mg | ORAL_TABLET | Freq: Two times a day (BID) | ORAL | Status: AC

## 2015-01-11 NOTE — Progress Notes (Signed)
Cardiology Office Note   Date:  01/11/2015   ID:  Zachary Haas, DOB 1927/12/27, MRN [id number]  PCP:  Henrine Screws, MD  Cardiologist:  Sinclair Grooms, MD   Chief Complaint  Patient presents with  . Atrial Fibrillation      History of Present Illness: Zachary Haas is a 81 y.o. male who presents for atrial fibrillation of unknown duration, embolic CVA, chronic diastolic heart failure, chronic obstructive pulmonary disease on chronic O2 therapy, and prior history of coronary artery bypass grafting.  Back today for evaluation of rate control on digoxin. Increasing cognitive impairment. Appetite is been stable. Patient is unable to contribute in a meaningful way to the conversation. The patient's wife and daughter did most of the interaction and gave the history.    Past Medical History  Diagnosis Date  . Personal history of other diseases of circulatory system   . Other and unspecified coagulation defects   . Depressive disorder, not elsewhere classified   . Pure hypercholesterolemia   . Radiotherapy   . Rectal fistula   . Personal history of tobacco use, presenting hazards to health     1-1 1/2 ppd for 50 years  . Malignant neoplasm of bladder, part unspecified   . Other chronic cystitis   . Intestinovesical fistula   . Gross hematuria   . Malignant neoplasm of prostate   . Myocardial infarction 1992, 1998  . Aortic aneurysm 2008  . Hypertension   . Tendinitis   . Breathing problem     shallow/ chronic productive cough  . Bronchitis   . GERD (gastroesophageal reflux disease)   . Headache(784.0)     sinus  . Coronary artery disease   . Left bundle branch block   . Prostate cancer   . Bladder cancer     Past Surgical History  Procedure Laterality Date  . Colon surgery  1966    removed 6 " colon  . Colon surgery  1968    scar tissue  . Balloon dilation  1992    heart attack  . Angioplasty  1993  . Bypass graft  1998    heart attack  .  Prostate ca  04/1997    Dr. Valere Dross  . Radiation seeding implant  05/1997  . Coloc cauterization  05/1998  . Bladder ca  08/2006  . Cystoscopy  1999    with biopsy x2  . Spt tube  2011  . Prostate surgery  1998    seed inplantation  radiation treatments  . Coronary artery bypass graft    . Cardiac catheterization    . Cataract extraction    . Laparoscopic diverted colostomy  08/04/2012    Procedure: LAPAROSCOPIC DIVERTED COLOSTOMY;  Surgeon: Edward Jolly, MD;  Location: [facility];  Service: General;  Laterality: N/A;  Laparoscopic Colostomy, surgery start time 13:24  . Cystostomy  08/04/2012    Procedure: CYSTOSTOMY SUPRAPUBIC;  Surgeon: Bernestine Amass, MD;  Location: [facility];  Service: Urology;  Laterality: N/A;  Cysto via Supra Pubic Tract and Insertion of New Suprapubic Tube, possible Bladder Biopsy, PROCEDURE START 1300, STOP 1314  . Cystoscopy N/A 10/22/2012    Procedure: CYSTOSCOPY;  Surgeon: Bernestine Amass, MD;  Location: [facility];  Service: Urology;  Laterality: N/A;  Flexible CYSTOSCOPY VIA SUPRAPUBIC TRACT, HOLMIUM LASER LITHOTRIPSY, NEW SUPRAPUBIC TUBE INSERTION   . Insertion of suprapubic catheter N/A 10/22/2012    Procedure: INSERTION OF SUPRAPUBIC CATHETER;  Surgeon: Bernestine Amass,  MD;  Location: [facility];  Service: Urology;  Laterality: N/A;  . Holmium laser application N/A 01/09/6578    Procedure: HOLMIUM LASER APPLICATION;  Surgeon: Bernestine Amass, MD;  Location: [facility];  Service: Urology;  Laterality: N/A;     Current Outpatient Prescriptions  Medication Sig Dispense Refill  . apixaban (ELIQUIS) 5 MG TABS tablet Take 1 tablet (5 mg total) by mouth 2 (two) times daily. 12 tablet 0  . buPROPion (WELLBUTRIN) 100 MG tablet Take 100 mg by mouth 2 (two) times daily.     . cefUROXime (CEFTIN) 500 MG tablet Take 1 tablet (500 mg total) by mouth 2 (two) times daily with a meal. 14 tablet 0  . Cholecalciferol (VITAMIN D3) 50000 UNITS CAPS Take 5,000 Units by mouth daily.    .  citalopram (CELEXA) 40 MG tablet Take 40 mg by mouth daily.  2  . Cranberry 500 MG CAPS Take 500 mg by mouth daily.    Marland Kitchen dextromethorphan-guaiFENesin (MUCINEX DM) 30-600 MG per 12 hr tablet Take 2 tablets by mouth every 12 (twelve) hours.    . digoxin (LANOXIN) 0.125 MG tablet Take 1 tablet (0.125 mg total) by mouth daily. 30 tablet 5  . furosemide (LASIX) 80 MG tablet Take 1 tablet (80 mg total) by mouth 2 (two) times daily. 60 tablet 2  . metoprolol tartrate (LOPRESSOR) 25 MG tablet Take 0.5 tablets (12.5 mg total) by mouth 2 (two) times daily.    . Multiple Vitamins-Minerals (MULTIVITAMIN WITH MINERALS) tablet Take 1 tablet by mouth daily.    Marland Kitchen OVER THE COUNTER MEDICATION Take 100 mg by mouth 3 (three) times daily. (stool softener)    . OVER THE COUNTER MEDICATION Take 5 mg by mouth 2 (two) times daily. (fiber)    . potassium chloride SA (K-DUR,KLOR-CON) 20 MEQ tablet Take 1 tablet (20 mEq total) by mouth 2 (two) times daily. 60 tablet 5  . simvastatin (ZOCOR) 40 MG tablet Take 40 mg by mouth at bedtime.  2   No current facility-administered medications for this visit.    Allergies:   Influenza vac split; Percocet; Tape; Ciprofloxacin; and Latex    Social History:  The patient  reports that he quit smoking about 18 years ago. His smoking use included Cigarettes. He quit after 50 years of use. He has never used smokeless tobacco. He reports that he does not drink alcohol or use illicit drugs.   Family History:  The patient's family history includes Cancer in his father; Stroke in his mother.    ROS:  Please see the history of present illness.   Otherwise, review of systems are positive for none.   All other systems are reviewed and negative.    PHYSICAL EXAM: VS:  BP 98/52 mmHg  Pulse 62  Ht 5' 8.5" (1.74 m)  Wt 102.331 kg (225 lb 9.6 oz)  BMI 33.80 kg/m2  SpO2 89% , BMI Body mass index is 33.8 kg/(m^2). GEN: Well nourished, well developed, in no acute distress HEENT:  normal Neck: no JVD, carotid bruits, or masses Cardiac: RRR; no murmurs, rubs, or gallops,no edema  Respiratory:  clear to auscultation bilaterally, normal work of breathing GI: soft, nontender, nondistended, + BS MS: no deformity or atrophy Skin: warm and dry, no rash Neuro:  Strength and sensation are intact Psych: euthymic mood, full affect   EKG:  EKG is not ordered today.    Recent Labs: 11/23/2014: BUN 16; Creat 1.26; Potassium 3.7; Sodium 139    Lipid  Panel    Component Value Date/Time   CHOL  09/13/2008 0455    137        ATP III CLASSIFICATION:  <200     mg/dL   Desirable  200-239  mg/dL   Borderline High  >=240    mg/dL   High          TRIG 70 09/13/2008 0455   HDL 35* 09/13/2008 0455   CHOLHDL 3.9 09/13/2008 0455   VLDL 14 09/13/2008 0455   LDLCALC  09/13/2008 0455    88        Total Cholesterol/HDL:CHD Risk Coronary Heart Disease Risk Table                     Men   Women  1/2 Average Risk   3.4   3.3  Average Risk       5.0   4.4  2 X Average Risk   9.6   7.1  3 X Average Risk  23.4   11.0        Use the calculated Patient Ratio above and the CHD Risk Table to determine the patient's CHD Risk.        ATP III CLASSIFICATION (LDL):  <100     mg/dL   Optimal  100-129  mg/dL   Near or Above                    Optimal  130-159  mg/dL   Borderline  160-189  mg/dL   High  >190     mg/dL   Very High      Wt Readings from Last 3 Encounters:  01/11/15 102.331 kg (225 lb 9.6 oz)  11/23/14 103.42 kg (228 lb)  10/08/14 96.163 kg (212 lb)      Other studies Reviewed: Additional studies/ records that were reviewed today include: . Review of the above records demonstrates:    ASSESSMENT AND PLAN:  1. S/P CABG x 4  Denies angina  2. Left bundle branch block  Not addressed  3. Essential hypertension  Relatively low  4. Chronic diastolic HF (heart failure), NYHA class 3  mild volume overload as noted by lower extremity edema. Neck veins are  flat the lungs are clear  5.  Atrial fibrillation , persistent  not addressed  6. Aortic aneurysm     Current medicines are reviewed at length with the patient today.  The patient has concerns regarding medicines.  The following changes have been made:  Decrease metoprolol to 12.5 mg twice a day. This should help support his blood pressure. Consider decreasing diuretic dose on next office visit. On next visit we will check laboratory data and consider making that change. In the meantime they need to monitor his heart rate on the lower dose metoprolol to make sure that his heart rate does not increase excessively. We will decrease diuretic on the next office visit if no volume overload an heart rate is not too fast.  Labs/ tests ordered today include:   Orders Placed This Encounter  Procedures  . Basic metabolic panel  . B Nat Peptide  . Digoxin level     Disposition:   FU with HS in 1 months  Signed, Sinclair Grooms, MD  01/11/2015 5:47 PM    [facility] [address], [place], [place]  [address] Phone: [phone]; Fax: [phone]

## 2015-01-11 NOTE — Patient Instructions (Addendum)
Medication Instructions:  Your physician has recommended you make the following change in your medication:  REDUCE Metoprolol to 12.5mg  Twice daily   Labwork: Your physician recommends that you return for lab work same day as next office visit (Bmet,Bnp,Dig level)  Testing/Procedures: None   Follow-Up: You have a follow up appointment on 02/16/15 @ 3pm   Any Other Special Instructions Will Be Listed Below (If Applicable).

## 2015-02-15 NOTE — Progress Notes (Signed)
Cardiology Office Note   Date:  02/16/2015   ID:  Zachary Haas, DOB [birth year], MRN [id number]  PCP:  Henrine Screws, MD  Cardiologist:  Sinclair Grooms, MD   Chief Complaint  Patient presents with  . Atrial Fibrillation      History of Present Illness: Zachary Haas is a 81 y.o. male who presents for coronary artery disease with prior bypass, abdominal aortic aneurysm, COPD, chronic diastolic heart failure, left bundle branch block, and COPD with chronic O2 dependence.  One month ago we made adjustments in medications. We added digoxin and removed isosorbide. I'll go instability control atrial fibrillation rate and also allow a higher blood pressure. A further goal is to allow better diuresis if indicated/needed. since medication adjustments, blood pressure has been higher but still within the normal range.    Past Medical History  Diagnosis Date  . Personal history of other diseases of circulatory system   . Other and unspecified coagulation defects   . Depressive disorder, not elsewhere classified   . Pure hypercholesterolemia   . Radiotherapy   . Rectal fistula   . Personal history of tobacco use, presenting hazards to health     1-1 1/2 ppd for 50 years  . Malignant neoplasm of bladder, part unspecified   . Other chronic cystitis   . Intestinovesical fistula   . Gross hematuria   . Malignant neoplasm of prostate   . Myocardial infarction 1992, 1998  . Aortic aneurysm 2008  . Hypertension   . Tendinitis   . Breathing problem     shallow/ chronic productive cough  . Bronchitis   . GERD (gastroesophageal reflux disease)   . Headache(784.0)     sinus  . Coronary artery disease   . Left bundle branch block   . Prostate cancer   . Bladder cancer     Past Surgical History  Procedure Laterality Date  . Colon surgery  1966    removed 6 " colon  . Colon surgery  1968    scar tissue  . Balloon dilation  1992    heart attack  . Angioplasty  1993  .  Bypass graft  1998    heart attack  . Prostate ca  04/1997    Dr. Valere Dross  . Radiation seeding implant  05/1997  . Coloc cauterization  05/1998  . Bladder ca  08/2006  . Cystoscopy  1999    with biopsy x2  . Spt tube  2011  . Prostate surgery  1998    seed inplantation  radiation treatments  . Coronary artery bypass graft    . Cardiac catheterization    . Cataract extraction    . Laparoscopic diverted colostomy  08/04/2012    Procedure: LAPAROSCOPIC DIVERTED COLOSTOMY;  Surgeon: Edward Jolly, MD;  Location: [facility];  Service: General;  Laterality: N/A;  Laparoscopic Colostomy, surgery start time 13:24  . Cystostomy  08/04/2012    Procedure: CYSTOSTOMY SUPRAPUBIC;  Surgeon: Bernestine Amass, MD;  Location: [facility];  Service: Urology;  Laterality: N/A;  Cysto via Supra Pubic Tract and Insertion of New Suprapubic Tube, possible Bladder Biopsy, PROCEDURE START 1300, STOP 1314  . Cystoscopy N/A 10/22/2012    Procedure: CYSTOSCOPY;  Surgeon: Bernestine Amass, MD;  Location: [facility];  Service: Urology;  Laterality: N/A;  Flexible CYSTOSCOPY VIA SUPRAPUBIC TRACT, HOLMIUM LASER LITHOTRIPSY, NEW SUPRAPUBIC TUBE INSERTION   . Insertion of suprapubic catheter N/A 10/22/2012    Procedure: INSERTION OF  SUPRAPUBIC CATHETER;  Surgeon: Bernestine Amass, MD;  Location: [facility];  Service: Urology;  Laterality: N/A;  . Holmium laser application N/A 9/81/1914    Procedure: HOLMIUM LASER APPLICATION;  Surgeon: Bernestine Amass, MD;  Location: [facility];  Service: Urology;  Laterality: N/A;     Current Outpatient Prescriptions  Medication Sig Dispense Refill  . apixaban (ELIQUIS) 5 MG TABS tablet Take 1 tablet (5 mg total) by mouth 2 (two) times daily. 12 tablet 0  . buPROPion (WELLBUTRIN) 100 MG tablet Take 100 mg by mouth 2 (two) times daily.     . cefUROXime (CEFTIN) 500 MG tablet Take 1 tablet (500 mg total) by mouth 2 (two) times daily with a meal. 14 tablet 0  . cephALEXin (KEFLEX) 500 MG capsule Take 500 mg by  mouth daily.    . Cholecalciferol (VITAMIN D3) 50000 UNITS CAPS Take 5,000 Units by mouth daily.    . citalopram (CELEXA) 40 MG tablet Take 40 mg by mouth daily.  2  . Cranberry 500 MG CAPS Take 500 mg by mouth daily.    Marland Kitchen dextromethorphan-guaiFENesin (MUCINEX DM) 30-600 MG per 12 hr tablet Take 2 tablets by mouth every 12 (twelve) hours.    . digoxin (LANOXIN) 0.125 MG tablet Take 1 tablet (0.125 mg total) by mouth daily. 30 tablet 5  . furosemide (LASIX) 80 MG tablet Take 1 tablet (80 mg total) by mouth 2 (two) times daily. 60 tablet 2  . Memantine HCl ER (NAMENDA XR TITRATION PACK) 7 & 14 & 21 &28 MG CP24 Take one (1) tablet by mouth daily as directed by graduate package.    . metoprolol tartrate (LOPRESSOR) 25 MG tablet Take 0.5 tablets (12.5 mg total) by mouth 2 (two) times daily.    . Multiple Vitamins-Minerals (MULTIVITAMIN WITH MINERALS) tablet Take 1 tablet by mouth daily.    Marland Kitchen OVER THE COUNTER MEDICATION Take 100 mg by mouth 3 (three) times daily. (stool softener)    . OVER THE COUNTER MEDICATION Take 5 mg by mouth 2 (two) times daily. (fiber)    . potassium chloride SA (K-DUR,KLOR-CON) 20 MEQ tablet Take 1 tablet (20 mEq total) by mouth 2 (two) times daily. 60 tablet 5  . simvastatin (ZOCOR) 40 MG tablet Take 40 mg by mouth at bedtime.  2   No current facility-administered medications for this visit.    Allergies:   Influenza vac split; Percocet; Tape; Ciprofloxacin; and Latex    Social History:  The patient  reports that he quit smoking about 18 years ago. His smoking use included Cigarettes. He quit after 50 years of use. He has never used smokeless tobacco. He reports that he does not drink alcohol or use illicit drugs.   Family History:  The patient's family history includes Cancer in his father; Stroke in his mother.    ROS:  Please see the history of present illness.   Otherwise, review of systems are positive for increased confusion and recently started on Namenda by Dr.  Inda Merlin.   All other systems are reviewed and negative.    PHYSICAL EXAM: VS:  BP 118/60 mmHg  Pulse 74  Ht 5' 8.5" (1.74 m)  Wt 98.34 kg (216 lb 12.8 oz)  BMI 32.48 kg/m2 , BMI Body mass index is 32.48 kg/(m^2). GEN: Well nourished, well developed, in no acute distress HEENT: normal Neck: no JVD, carotid bruits, or masses Cardiac: IIRR; no murmurs, rubs, or gallops,no edema  Respiratory:  clear to auscultation bilaterally, normal  work of breathing GI: soft, nontender, nondistended, + BS MS: no deformity or atrophy Skin: warm and dry, no rash Neuro:  Strength and sensation are intact Psych: euthymic mood, full affect   EKG:  EKG is ordered today. The ekg ordered today demonstrates normal sinus rhythm with first-degree AV block. He has right bundle with left anterior hemiblock. Since the last tracing atrial fibrillation has resolved.   Recent Labs: 11/23/2014: BUN 16; Creat 1.26; Potassium 3.7; Sodium 139    Lipid Panel    Component Value Date/Time   CHOL  09/13/2008 0455    137        ATP III CLASSIFICATION:  <200     mg/dL   Desirable  200-239  mg/dL   Borderline High  >=240    mg/dL   High          TRIG 70 09/13/2008 0455   HDL 35* 09/13/2008 0455   CHOLHDL 3.9 09/13/2008 0455   VLDL 14 09/13/2008 0455   LDLCALC  09/13/2008 0455    88        Total Cholesterol/HDL:CHD Risk Coronary Heart Disease Risk Table                     Men   Women  1/2 Average Risk   3.4   3.3  Average Risk       5.0   4.4  2 X Average Risk   9.6   7.1  3 X Average Risk  23.4   11.0        Use the calculated Patient Ratio above and the CHD Risk Table to determine the patient's CHD Risk.        ATP III CLASSIFICATION (LDL):  <100     mg/dL   Optimal  100-129  mg/dL   Near or Above                    Optimal  130-159  mg/dL   Borderline  160-189  mg/dL   High  >190     mg/dL   Very High      Wt Readings from Last 3 Encounters:  02/16/15 98.34 kg (216 lb 12.8 oz)  01/11/15  102.331 kg (225 lb 9.6 oz)  11/23/14 103.42 kg (228 lb)      Other studies Reviewed: Additional studies/ records that were reviewed today include: . Review of the above records demonstrates:    ASSESSMENT AND PLAN:  1. Atrial fibrillation, persistent With normal sinus rhythm on today's EKG, this diagnosis has changed to paroxysmal atrial fibrillation  2. S/P CABG x 4 Asymptomatic  3. Chronic diastolic HF (heart failure), NYHA class 3 Improvement in volume status with no evidence of fluid overload. Probably also facilitated by resumption of normal sinus rhythm  4. Other emphysema Chronic and O2 dependent  5. Essential hypertension Controlled  6. Right bundle branch block Right bundle with left anterior hemiblock    Current medicines are reviewed at length with the patient today.  The patient does not have concerns regarding medicines.  The following changes have been made:  no change.  Given favorable response to the current medical regimen, will check basic metabolic panel, CBC, and dig level. If all is well, no changes will be made.  Labs/ tests ordered today include:  No orders of the defined types were placed in this encounter.     Disposition:   FU with HS in  As needed.  Signed, Tamala Julian  Daiva Eves, MD  02/16/2015 3:21 PM    [facility] [address], [place], [place]  [address] Phone: [phone]; Fax: [phone]

## 2015-02-16 ENCOUNTER — Ambulatory Visit (INDEPENDENT_AMBULATORY_CARE_PROVIDER_SITE_OTHER): Payer: Medicare Other | Admitting: Interventional Cardiology

## 2015-02-16 ENCOUNTER — Encounter: Payer: Self-pay | Admitting: Interventional Cardiology

## 2015-02-16 VITALS — BP 118/60 | HR 74 | Ht 68.5 in | Wt 216.8 lb

## 2015-02-16 DIAGNOSIS — I481 Persistent atrial fibrillation: Secondary | ICD-10-CM

## 2015-02-16 DIAGNOSIS — J438 Other emphysema: Secondary | ICD-10-CM | POA: Diagnosis not present

## 2015-02-16 DIAGNOSIS — Z951 Presence of aortocoronary bypass graft: Secondary | ICD-10-CM | POA: Diagnosis not present

## 2015-02-16 DIAGNOSIS — I4819 Other persistent atrial fibrillation: Secondary | ICD-10-CM

## 2015-02-16 DIAGNOSIS — I10 Essential (primary) hypertension: Secondary | ICD-10-CM

## 2015-02-16 DIAGNOSIS — I5032 Chronic diastolic (congestive) heart failure: Secondary | ICD-10-CM

## 2015-02-16 DIAGNOSIS — I452 Bifascicular block: Secondary | ICD-10-CM

## 2015-02-16 LAB — BASIC METABOLIC PANEL
BUN: 13 mg/dL (ref 6–23)
CHLORIDE: 98 meq/L (ref 96–112)
CO2: 30 meq/L (ref 19–32)
Calcium: 9.7 mg/dL (ref 8.4–10.5)
Creatinine, Ser: 1.07 mg/dL (ref 0.40–1.50)
GFR: 69.78 mL/min (ref >60.00)
Glucose, Bld: 201 mg/dL — ABNORMAL HIGH (ref 70–99)
POTASSIUM: 3.8 meq/L (ref 3.5–5.1)
Sodium: 136 mEq/L (ref 135–145)

## 2015-02-16 LAB — CBC WITH DIFFERENTIAL/PLATELET
BASOS PCT: 0.4 % (ref 0.0–3.0)
Basophils Absolute: 0 10*3/uL (ref 0.0–0.1)
EOS PCT: 2.9 % (ref 0.0–5.0)
Eosinophils Absolute: 0.3 10*3/uL (ref 0.0–0.7)
HCT: 43.7 % (ref 39.0–52.0)
HEMOGLOBIN: 14.7 g/dL (ref 13.0–17.0)
LYMPHS PCT: 20.5 % (ref 12.0–46.0)
Lymphs Abs: 1.9 10*3/uL (ref 0.7–4.0)
MCHC: 33.5 g/dL (ref 30.0–36.0)
MCV: 98.5 fl (ref 78.0–100.0)
Monocytes Absolute: 0.6 10*3/uL (ref 0.1–1.0)
Monocytes Relative: 6.8 % (ref 3.0–12.0)
NEUTROS ABS: 6.4 10*3/uL (ref 1.4–7.7)
Neutrophils Relative %: 69.4 % (ref 43.0–77.0)
Platelets: 200 10*3/uL (ref 150.0–400.0)
RBC: 4.44 Mil/uL (ref 4.22–5.81)
RDW: 14.2 % (ref 11.5–15.5)
WBC: 9.2 10*3/uL (ref 4.0–10.5)

## 2015-02-16 NOTE — Patient Instructions (Addendum)
Medication Instructions:  Your physician recommends that you continue on your current medications as directed. Please refer to the Current Medication list given to you today.   Labwork: Bmet, Cbc, DigToday  Testing/Procedures: None   Follow-Up: Your physician recommends that you schedule a follow-up appointment as needed   Any Other Special Instructions Will Be Listed Below (If Applicable).

## 2015-02-17 LAB — DIGOXIN LEVEL: Digoxin Level: 0.7 ug/L — ABNORMAL LOW (ref 0.8–2.0)

## 2015-02-21 ENCOUNTER — Telehealth: Payer: Self-pay

## 2015-02-21 NOTE — Telephone Encounter (Signed)
Pt wife aware of lab results with verbal understanding. Labs are stable. No change in therapy needed.

## 2015-02-21 NOTE — Telephone Encounter (Signed)
-----   Message from Belva Crome, MD sent at 02/18/2015  3:34 PM EDT ----- Labs are stable. No change in therapy needed.

## 2015-03-31 IMAGING — CR DG CHEST 2V
2 series · 2 of 2 positions shown · non-contrast
Comparison: 07/25/2012

CLINICAL DATA: Preop radiograph.

CHEST - 2 VIEW

[w chest pa]
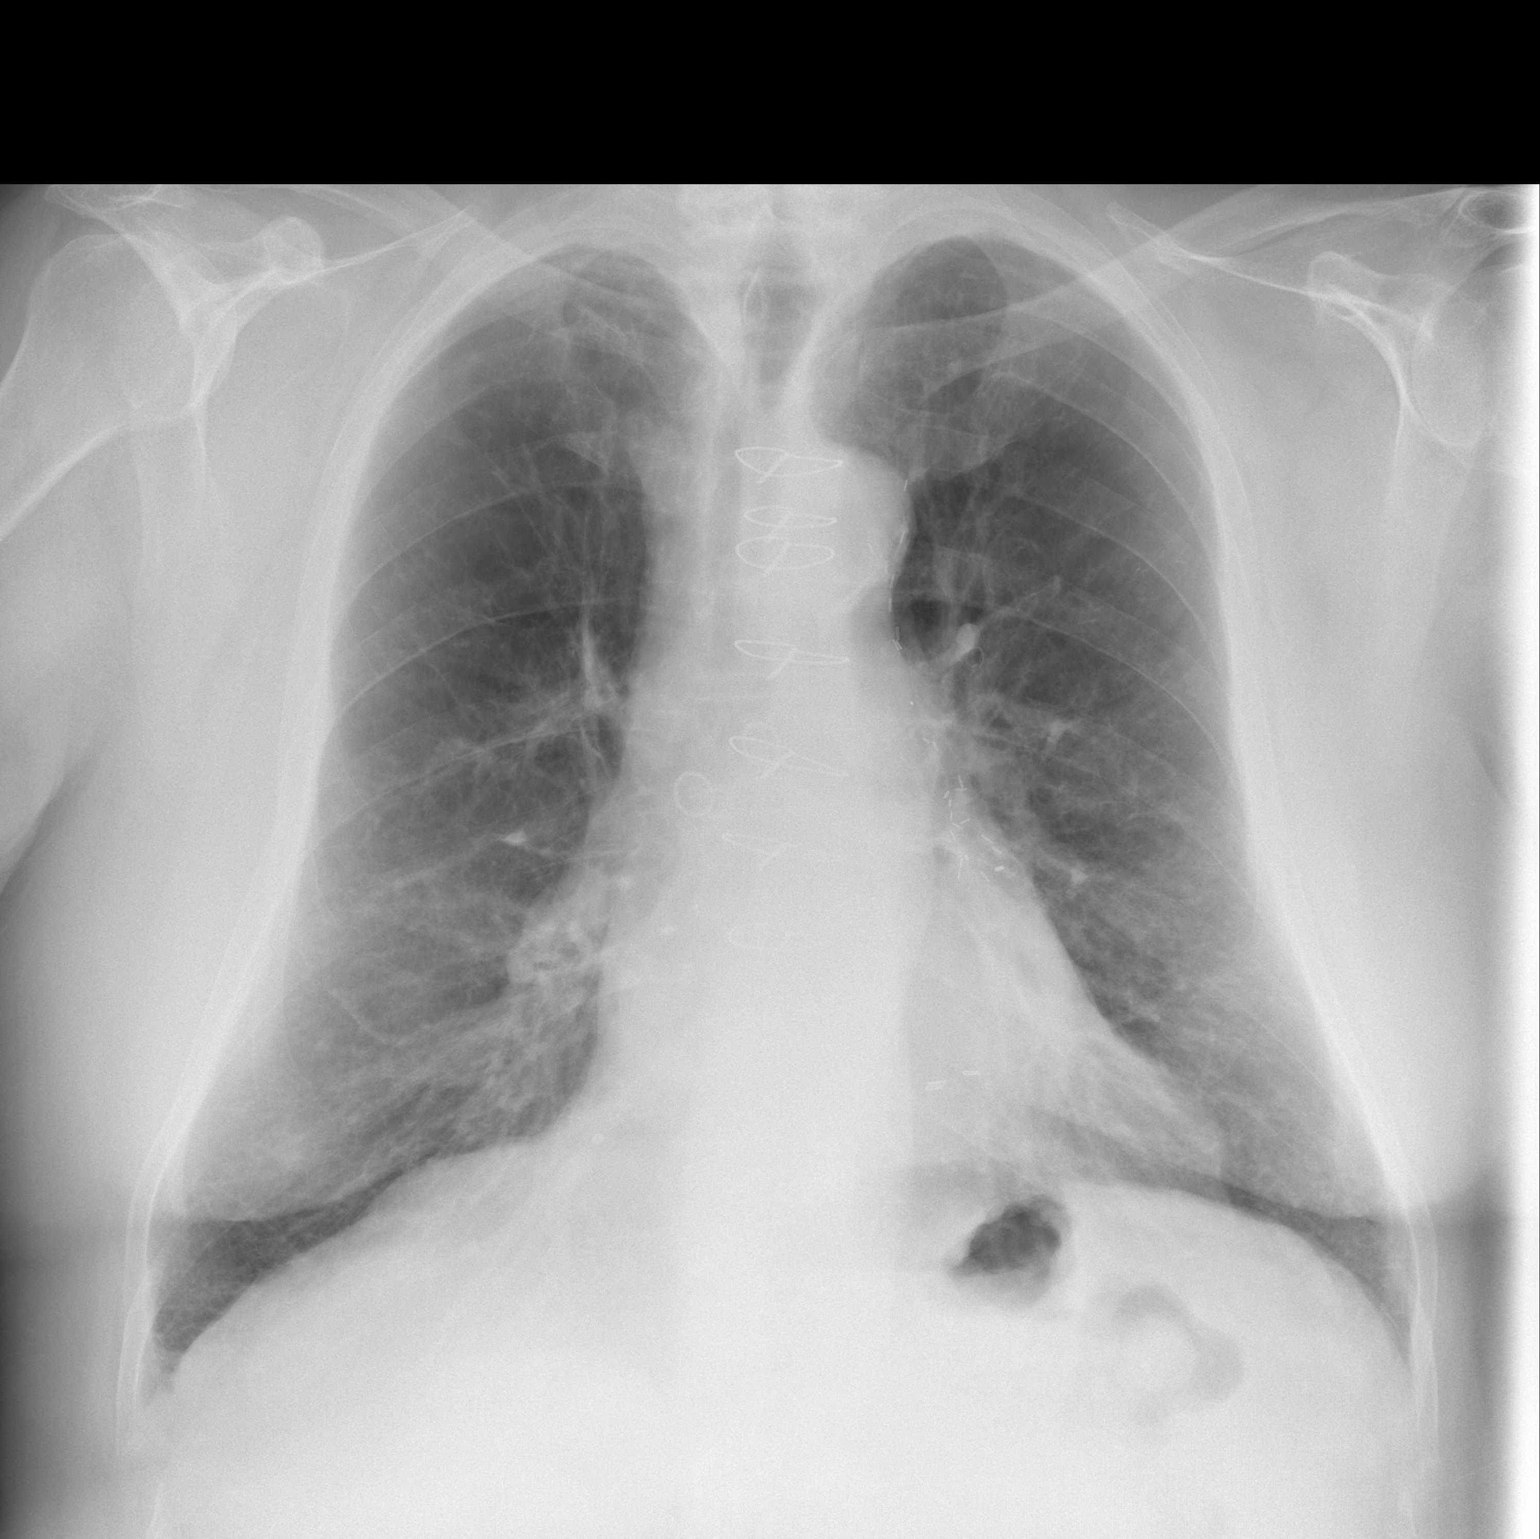

[w chest lat]
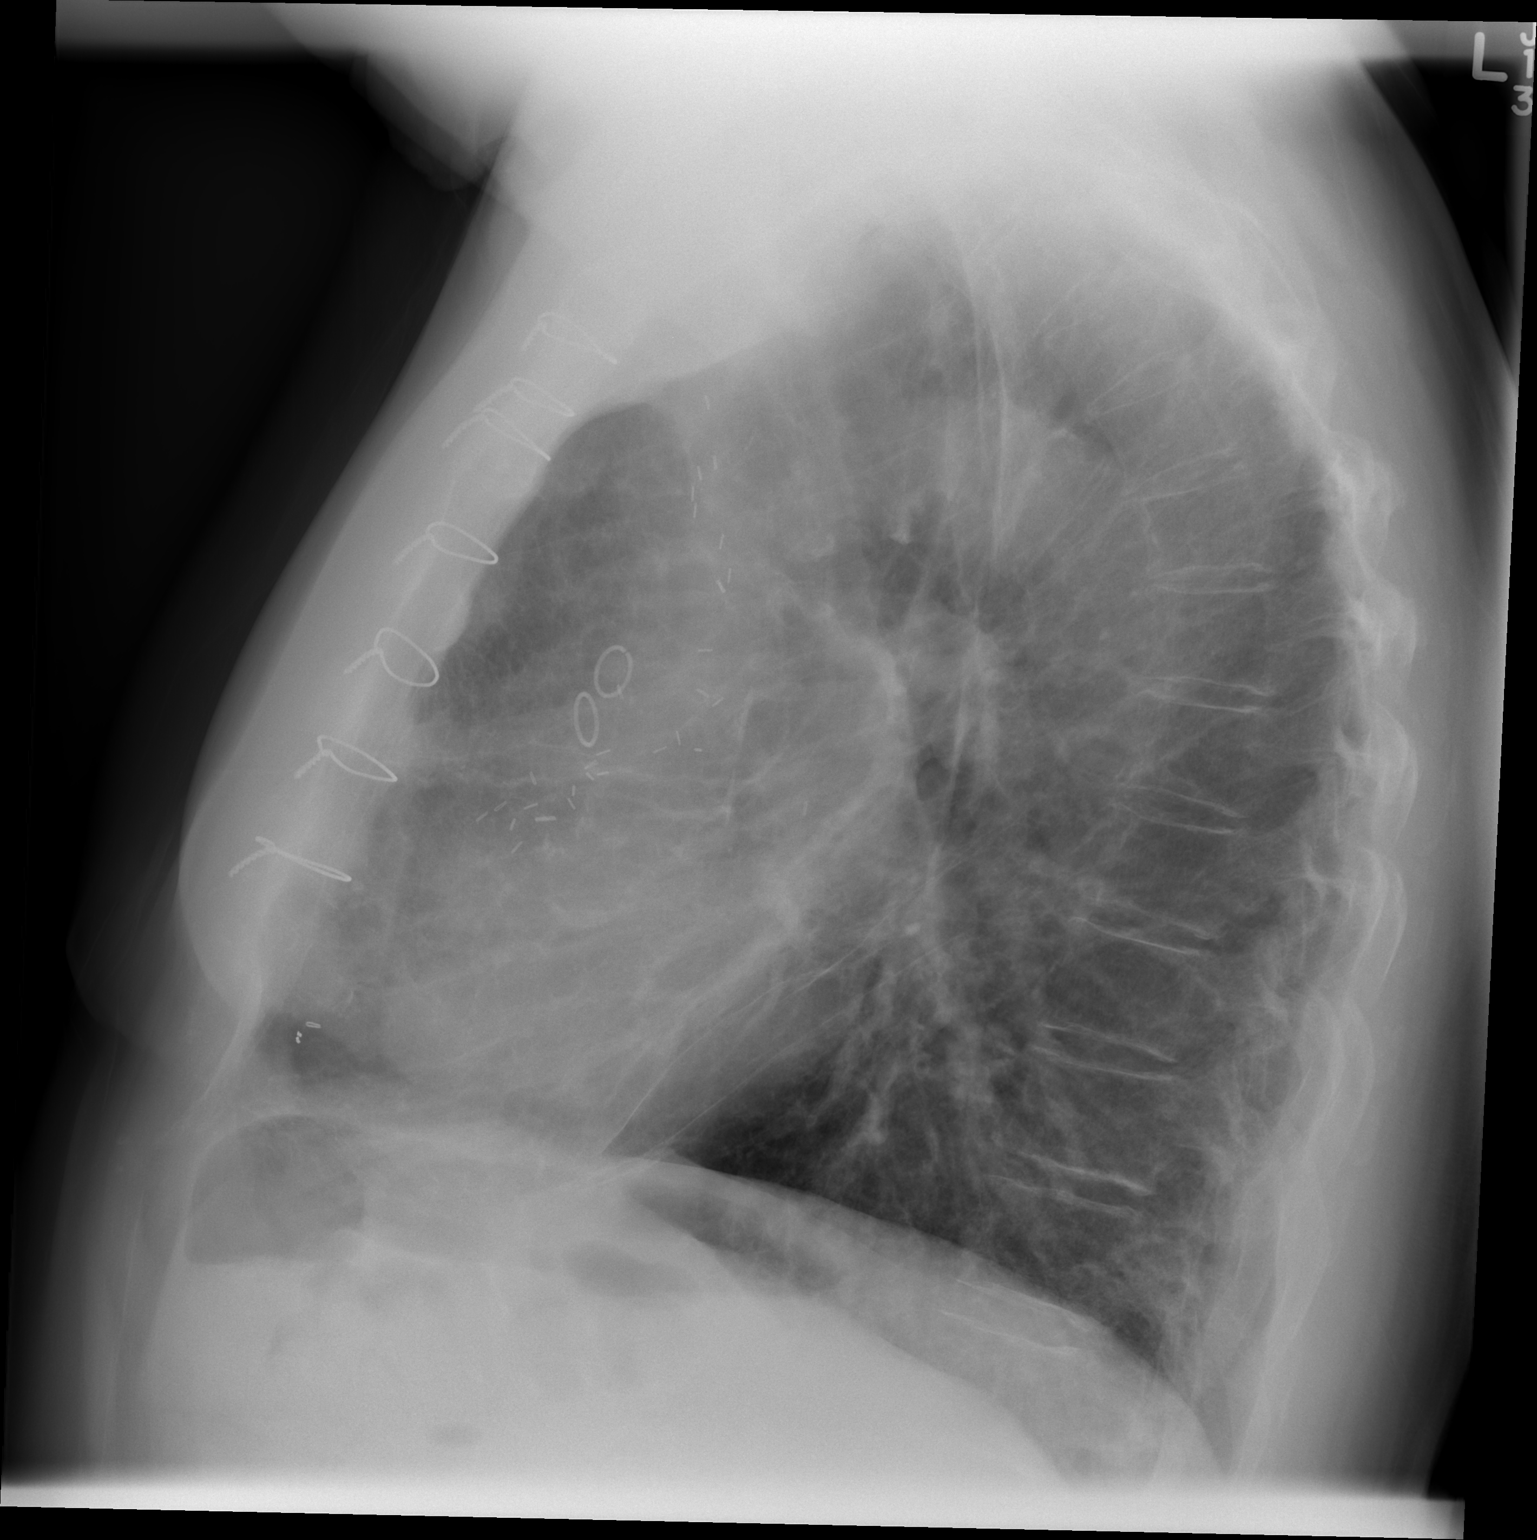

[2 of 2 positions shown; findings below may reference images not displayed]

FINDINGS: Previous median sternotomy and CABG procedure.

Normal heart size.  There is no pleural effusion or edema
identified.  No airspace consolidation noted.

Lungs appear hyperinflated and there are coarsened interstitial
markings noted bilaterally.
IMPRESSION: 1.  No acute cardiopulmonary abnormalities.
2.  Chronic interstitial coarsening and hyperinflation.

## 2015-08-01 ENCOUNTER — Other Ambulatory Visit: Payer: Self-pay | Admitting: *Deleted

## 2015-08-01 MED ORDER — APIXABAN 5 MG PO TABS: 5.0000 mg | ORAL_TABLET | Freq: Two times a day (BID) | ORAL | Status: AC

## 2015-08-01 MED ORDER — APIXABAN 5 MG PO TABS: 5.0000 mg | ORAL_TABLET | Freq: Two times a day (BID) | ORAL | Status: DC

## 2015-10-09 ENCOUNTER — Inpatient Hospital Stay (HOSPITAL_COMMUNITY)
Admission: EM | Admit: 2015-10-09 | Discharge: 2015-10-13 | DRG: 872 | Disposition: A | Discharge status: Home Health | Payer: Medicare Other | Attending: Internal Medicine | Admitting: Internal Medicine

## 2015-10-09 ENCOUNTER — Emergency Department (HOSPITAL_COMMUNITY): Payer: Medicare Other

## 2015-10-09 ENCOUNTER — Encounter (HOSPITAL_COMMUNITY): Payer: Self-pay | Admitting: *Deleted

## 2015-10-09 DIAGNOSIS — I252 Old myocardial infarction: Secondary | ICD-10-CM | POA: Diagnosis not present

## 2015-10-09 DIAGNOSIS — N182 Chronic kidney disease, stage 2 (mild): Secondary | ICD-10-CM | POA: Diagnosis present

## 2015-10-09 DIAGNOSIS — Z9981 Dependence on supplemental oxygen: Secondary | ICD-10-CM

## 2015-10-09 DIAGNOSIS — F329 Major depressive disorder, single episode, unspecified: Secondary | ICD-10-CM | POA: Diagnosis present

## 2015-10-09 DIAGNOSIS — Z885 Allergy status to narcotic agent status: Secondary | ICD-10-CM | POA: Diagnosis not present

## 2015-10-09 DIAGNOSIS — I130 Hypertensive heart and chronic kidney disease with heart failure and stage 1 through stage 4 chronic kidney disease, or unspecified chronic kidney disease: Secondary | ICD-10-CM | POA: Diagnosis present

## 2015-10-09 DIAGNOSIS — Z8546 Personal history of malignant neoplasm of prostate: Secondary | ICD-10-CM

## 2015-10-09 DIAGNOSIS — N360 Urethral fistula: Secondary | ICD-10-CM | POA: Diagnosis present

## 2015-10-09 DIAGNOSIS — I447 Left bundle-branch block, unspecified: Secondary | ICD-10-CM | POA: Diagnosis present

## 2015-10-09 DIAGNOSIS — F419 Anxiety disorder, unspecified: Secondary | ICD-10-CM | POA: Diagnosis present

## 2015-10-09 DIAGNOSIS — Z7901 Long term (current) use of anticoagulants: Secondary | ICD-10-CM | POA: Diagnosis not present

## 2015-10-09 DIAGNOSIS — I4510 Unspecified right bundle-branch block: Secondary | ICD-10-CM | POA: Diagnosis present

## 2015-10-09 DIAGNOSIS — I481 Persistent atrial fibrillation: Secondary | ICD-10-CM | POA: Diagnosis present

## 2015-10-09 DIAGNOSIS — N179 Acute kidney failure, unspecified: Secondary | ICD-10-CM | POA: Diagnosis present

## 2015-10-09 DIAGNOSIS — Z923 Personal history of irradiation: Secondary | ICD-10-CM

## 2015-10-09 DIAGNOSIS — E876 Hypokalemia: Secondary | ICD-10-CM | POA: Diagnosis present

## 2015-10-09 DIAGNOSIS — E7800 Pure hypercholesterolemia, unspecified: Secondary | ICD-10-CM | POA: Diagnosis present

## 2015-10-09 DIAGNOSIS — Z823 Family history of stroke: Secondary | ICD-10-CM | POA: Diagnosis not present

## 2015-10-09 DIAGNOSIS — I4819 Other persistent atrial fibrillation: Secondary | ICD-10-CM | POA: Diagnosis present

## 2015-10-09 DIAGNOSIS — Z91040 Latex allergy status: Secondary | ICD-10-CM

## 2015-10-09 DIAGNOSIS — Z87891 Personal history of nicotine dependence: Secondary | ICD-10-CM

## 2015-10-09 DIAGNOSIS — N189 Chronic kidney disease, unspecified: Secondary | ICD-10-CM

## 2015-10-09 DIAGNOSIS — K219 Gastro-esophageal reflux disease without esophagitis: Secondary | ICD-10-CM | POA: Diagnosis present

## 2015-10-09 DIAGNOSIS — Z8042 Family history of malignant neoplasm of prostate: Secondary | ICD-10-CM

## 2015-10-09 DIAGNOSIS — N1330 Unspecified hydronephrosis: Secondary | ICD-10-CM | POA: Diagnosis present

## 2015-10-09 DIAGNOSIS — Z951 Presence of aortocoronary bypass graft: Secondary | ICD-10-CM | POA: Diagnosis not present

## 2015-10-09 DIAGNOSIS — I452 Bifascicular block: Secondary | ICD-10-CM | POA: Diagnosis present

## 2015-10-09 DIAGNOSIS — L89151 Pressure ulcer of sacral region, stage 1: Secondary | ICD-10-CM | POA: Diagnosis present

## 2015-10-09 DIAGNOSIS — N132 Hydronephrosis with renal and ureteral calculous obstruction: Secondary | ICD-10-CM | POA: Diagnosis present

## 2015-10-09 DIAGNOSIS — Z881 Allergy status to other antibiotic agents status: Secondary | ICD-10-CM

## 2015-10-09 DIAGNOSIS — E875 Hyperkalemia: Secondary | ICD-10-CM | POA: Diagnosis present

## 2015-10-09 DIAGNOSIS — L8990 Pressure ulcer of unspecified site, unspecified stage: Secondary | ICD-10-CM | POA: Diagnosis present

## 2015-10-09 DIAGNOSIS — Z887 Allergy status to serum and vaccine status: Secondary | ICD-10-CM

## 2015-10-09 DIAGNOSIS — Z79899 Other long term (current) drug therapy: Secondary | ICD-10-CM

## 2015-10-09 DIAGNOSIS — I482 Chronic atrial fibrillation: Secondary | ICD-10-CM | POA: Diagnosis present

## 2015-10-09 DIAGNOSIS — Z8551 Personal history of malignant neoplasm of bladder: Secondary | ICD-10-CM | POA: Diagnosis not present

## 2015-10-09 DIAGNOSIS — F0390 Unspecified dementia without behavioral disturbance: Secondary | ICD-10-CM | POA: Diagnosis present

## 2015-10-09 DIAGNOSIS — Z66 Do not resuscitate: Secondary | ICD-10-CM | POA: Diagnosis present

## 2015-10-09 DIAGNOSIS — B965 Pseudomonas (aeruginosa) (mallei) (pseudomallei) as the cause of diseases classified elsewhere: Secondary | ICD-10-CM | POA: Diagnosis present

## 2015-10-09 DIAGNOSIS — E872 Acidosis: Secondary | ICD-10-CM | POA: Diagnosis present

## 2015-10-09 DIAGNOSIS — I2510 Atherosclerotic heart disease of native coronary artery without angina pectoris: Secondary | ICD-10-CM | POA: Diagnosis present

## 2015-10-09 DIAGNOSIS — T83098A Other mechanical complication of other indwelling urethral catheter, initial encounter: Secondary | ICD-10-CM | POA: Diagnosis not present

## 2015-10-09 DIAGNOSIS — I5032 Chronic diastolic (congestive) heart failure: Secondary | ICD-10-CM | POA: Diagnosis present

## 2015-10-09 DIAGNOSIS — Z8679 Personal history of other diseases of the circulatory system: Secondary | ICD-10-CM

## 2015-10-09 DIAGNOSIS — Z9109 Other allergy status, other than to drugs and biological substances: Secondary | ICD-10-CM | POA: Diagnosis not present

## 2015-10-09 DIAGNOSIS — Z933 Colostomy status: Secondary | ICD-10-CM | POA: Diagnosis not present

## 2015-10-09 DIAGNOSIS — E785 Hyperlipidemia, unspecified: Secondary | ICD-10-CM | POA: Diagnosis present

## 2015-10-09 DIAGNOSIS — E860 Dehydration: Secondary | ICD-10-CM | POA: Diagnosis present

## 2015-10-09 DIAGNOSIS — Z87442 Personal history of urinary calculi: Secondary | ICD-10-CM | POA: Diagnosis not present

## 2015-10-09 DIAGNOSIS — N390 Urinary tract infection, site not specified: Secondary | ICD-10-CM | POA: Diagnosis present

## 2015-10-09 DIAGNOSIS — A419 Sepsis, unspecified organism: Secondary | ICD-10-CM | POA: Diagnosis not present

## 2015-10-09 DIAGNOSIS — I10 Essential (primary) hypertension: Secondary | ICD-10-CM | POA: Diagnosis present

## 2015-10-09 HISTORY — DX: Chronic kidney disease, stage 2 (mild): N18.2

## 2015-10-09 LAB — CBC WITH DIFFERENTIAL/PLATELET
BASOS ABS: 0 10*3/uL (ref 0.0–0.1)
BASOS PCT: 0 %
EOS ABS: 0.3 10*3/uL (ref 0.0–0.7)
Eosinophils Relative: 3 %
HCT: 38.3 % — ABNORMAL LOW (ref 39.0–52.0)
HEMOGLOBIN: 12.7 g/dL — AB (ref 13.0–17.0)
Lymphocytes Relative: 19 %
Lymphs Abs: 2.1 10*3/uL (ref 0.7–4.0)
MCH: 31.4 pg (ref 26.0–34.0)
MCHC: 33.2 g/dL (ref 30.0–36.0)
MCV: 94.6 fL (ref 78.0–100.0)
Monocytes Absolute: 1 10*3/uL (ref 0.1–1.0)
Monocytes Relative: 9 %
NEUTROS PCT: 69 %
Neutro Abs: 7.8 10*3/uL — ABNORMAL HIGH (ref 1.7–7.7)
Platelets: 206 10*3/uL (ref 150–400)
RBC: 4.05 MIL/uL — AB (ref 4.22–5.81)
RDW: 15 % (ref 11.5–15.5)
WBC: 11.2 10*3/uL — AB (ref 4.0–10.5)

## 2015-10-09 LAB — URINE MICROSCOPIC-ADD ON

## 2015-10-09 LAB — URINALYSIS, ROUTINE W REFLEX MICROSCOPIC
BILIRUBIN URINE: NEGATIVE
Glucose, UA: NEGATIVE mg/dL
Ketones, ur: NEGATIVE mg/dL
NITRITE: POSITIVE — AB
Protein, ur: 30 mg/dL — AB
SPECIFIC GRAVITY, URINE: 1.01 (ref 1.005–1.030)
pH: 7 (ref 5.0–8.0)

## 2015-10-09 LAB — BASIC METABOLIC PANEL
ANION GAP: 11 (ref 5–15)
BUN: 18 mg/dL (ref 6–20)
CHLORIDE: 97 mmol/L — AB (ref 101–111)
CO2: 29 mmol/L (ref 22–32)
CREATININE: 1.35 mg/dL — AB (ref 0.61–1.24)
Calcium: 8.9 mg/dL (ref 8.9–10.3)
GFR calc non Af Amer: 46 mL/min — ABNORMAL LOW (ref >60)
GFR, EST AFRICAN AMERICAN: 54 mL/min — AB (ref >60)
Glucose, Bld: 114 mg/dL — ABNORMAL HIGH (ref 65–99)
POTASSIUM: 3.2 mmol/L — AB (ref 3.5–5.1)
SODIUM: 137 mmol/L (ref 135–145)

## 2015-10-09 LAB — I-STAT CG4 LACTIC ACID, ED: LACTIC ACID, VENOUS: 2.23 mmol/L — AB (ref 0.5–2.0)

## 2015-10-09 MED ORDER — CRANBERRY 500 MG PO CAPS: 500.0000 mg | ORAL_CAPSULE | Freq: Every day | ORAL | Status: DC

## 2015-10-09 MED ORDER — VITAMIN D3 25 MCG (1000 UNIT) PO TABS
5000.0000 [IU] | ORAL_TABLET | Freq: Every day | ORAL | Status: DC
  Administered 2015-10-10 – 2015-10-13 (×4): 5000 [IU] via ORAL
  Filled 2015-10-09 (×7): qty 5

## 2015-10-09 MED ORDER — SODIUM CHLORIDE 0.9 % IV BOLUS (SEPSIS)
1000.0000 mL | Freq: Once | INTRAVENOUS | Status: DC
Start: 2015-10-09 — End: 2015-10-09

## 2015-10-09 MED ORDER — DONEPEZIL HCL 5 MG PO TABS
10.0000 mg | ORAL_TABLET | Freq: Every day | ORAL | Status: DC
  Administered 2015-10-10 – 2015-10-13 (×4): 10 mg via ORAL
  Filled 2015-10-09 (×4): qty 2

## 2015-10-09 MED ORDER — ONDANSETRON HCL 4 MG/2ML IJ SOLN: 4.0000 mg | Freq: Four times a day (QID) | INTRAMUSCULAR | Status: DC | PRN

## 2015-10-09 MED ORDER — SODIUM CHLORIDE 0.9 % IV SOLN
INTRAVENOUS | Status: DC
  Administered 2015-10-09: 23:00:00 via INTRAVENOUS

## 2015-10-09 MED ORDER — SIMVASTATIN 40 MG PO TABS
40.0000 mg | ORAL_TABLET | Freq: Every day | ORAL | Status: DC
  Administered 2015-10-10 – 2015-10-12 (×4): 40 mg via ORAL
  Filled 2015-10-09 (×4): qty 1

## 2015-10-09 MED ORDER — HYDRALAZINE HCL 20 MG/ML IJ SOLN
5.0000 mg | INTRAMUSCULAR | Status: DC | PRN
Start: 2015-10-09 — End: 2015-10-13

## 2015-10-09 MED ORDER — DM-GUAIFENESIN ER 30-600 MG PO TB12
1.0000 | ORAL_TABLET | Freq: Two times a day (BID) | ORAL | Status: DC
  Administered 2015-10-10 – 2015-10-13 (×8): 1 via ORAL
  Filled 2015-10-09 (×8): qty 1

## 2015-10-09 MED ORDER — DIGOXIN 125 MCG PO TABS
0.1250 mg | ORAL_TABLET | Freq: Every day | ORAL | Status: DC
  Administered 2015-10-10 – 2015-10-13 (×4): 0.125 mg via ORAL
  Filled 2015-10-09 (×4): qty 1

## 2015-10-09 MED ORDER — ONDANSETRON HCL 4 MG PO TABS
4.0000 mg | ORAL_TABLET | Freq: Four times a day (QID) | ORAL | Status: DC | PRN
Start: 2015-10-09 — End: 2015-10-13

## 2015-10-09 MED ORDER — CITALOPRAM HYDROBROMIDE 20 MG PO TABS
40.0000 mg | ORAL_TABLET | Freq: Every day | ORAL | Status: DC
  Administered 2015-10-10 – 2015-10-13 (×5): 40 mg via ORAL
  Filled 2015-10-09 (×4): qty 2
  Filled 2015-10-09: qty 1

## 2015-10-09 MED ORDER — SODIUM CHLORIDE 0.9% FLUSH
3.0000 mL | Freq: Two times a day (BID) | INTRAVENOUS | Status: DC
  Administered 2015-10-11: 3 mL via INTRAVENOUS

## 2015-10-09 MED ORDER — MEMANTINE HCL 10 MG PO TABS
10.0000 mg | ORAL_TABLET | Freq: Two times a day (BID) | ORAL | Status: DC
  Administered 2015-10-10 – 2015-10-13 (×8): 10 mg via ORAL
  Filled 2015-10-09 (×8): qty 1

## 2015-10-09 MED ORDER — ADULT MULTIVITAMIN W/MINERALS CH
1.0000 | ORAL_TABLET | Freq: Every day | ORAL | Status: DC
  Administered 2015-10-10 – 2015-10-13 (×4): 1 via ORAL
  Filled 2015-10-09 (×4): qty 1

## 2015-10-09 MED ORDER — BUPROPION HCL 100 MG PO TABS
100.0000 mg | ORAL_TABLET | Freq: Two times a day (BID) | ORAL | Status: DC
  Administered 2015-10-10 – 2015-10-13 (×8): 100 mg via ORAL
  Filled 2015-10-09 (×9): qty 1

## 2015-10-09 MED ORDER — DOCUSATE SODIUM 100 MG PO CAPS
300.0000 mg | ORAL_CAPSULE | Freq: Every day | ORAL | Status: DC
  Administered 2015-10-10 – 2015-10-12 (×4): 300 mg via ORAL
  Filled 2015-10-09 (×4): qty 3

## 2015-10-09 MED ORDER — TRIMETHOPRIM 100 MG PO TABS
100.0000 mg | ORAL_TABLET | Freq: Every evening | ORAL | Status: DC
  Administered 2015-10-10 – 2015-10-12 (×4): 100 mg via ORAL
  Filled 2015-10-09 (×5): qty 1

## 2015-10-09 MED ORDER — SODIUM CHLORIDE 0.9 % IV SOLN
INTRAVENOUS | Status: DC
  Administered 2015-10-10: 02:00:00 via INTRAVENOUS
  Administered 2015-10-11: 50 mL/h via INTRAVENOUS

## 2015-10-09 MED ORDER — POTASSIUM CHLORIDE 10 MEQ/100ML IV SOLN
10.0000 meq | INTRAVENOUS | Status: AC
  Administered 2015-10-10 (×2): 10 meq via INTRAVENOUS
  Filled 2015-10-09 (×2): qty 100

## 2015-10-09 MED ORDER — SODIUM CHLORIDE 0.9 % IV BOLUS (SEPSIS)
2000.0000 mL | Freq: Once | INTRAVENOUS | Status: AC
  Administered 2015-10-10: 2000 mL via INTRAVENOUS

## 2015-10-09 MED ORDER — DEXTROSE 5 % IV SOLN
1.0000 g | INTRAVENOUS | Status: DC
  Administered 2015-10-09: 1 g via INTRAVENOUS
  Filled 2015-10-09: qty 10

## 2015-10-09 MED ORDER — METOPROLOL TARTRATE 25 MG PO TABS
12.5000 mg | ORAL_TABLET | Freq: Two times a day (BID) | ORAL | Status: DC
  Administered 2015-10-10 – 2015-10-13 (×8): 12.5 mg via ORAL
  Filled 2015-10-09 (×8): qty 1

## 2015-10-09 NOTE — Progress Notes (Signed)
PHARMACIST - PHYSICIAN ORDER COMMUNICATION  CONCERNING: P&T Medication Policy on Herbal Medications  DESCRIPTION:  This patient's order for:  Cranberry  has been noted.  This product(s) is classified as an "herbal" or natural product. Due to a lack of definitive safety studies or FDA approval, nonstandard manufacturing practices, plus the potential risk of unknown drug-drug interactions while on inpatient medications, the Pharmacy and Therapeutics Committee does not permit the use of "herbal" or natural products of this type within [place].   ACTION TAKEN: The pharmacy department is unable to verify this order at this time and your patient has been informed of this safety policy. Please reevaluate patient's clinical condition at discharge and address if the herbal or natural product(s) should be resumed at that time.   Vincient Vanaman, PharmD 

## 2015-10-09 NOTE — ED Provider Notes (Signed)
CSN: [id number]     Arrival date & time 10/09/15  2130 History   First MD Initiated Contact with Patient 10/09/15 2134     Chief Complaint  Patient presents with  . SupraPubic Catheter Problems      (Consider location/radiation/quality/duration/timing/severity/associated sxs/prior Treatment) HPI Comments: Patient here with his wife noted that he has had increasing erythema and redness to his perineum. Denies any fever, vomiting. Has a history of a suprapubic catheter secondary to prostate and bladder CA. He also has a history of a rectal fistula. Has chronic draining from his rectum of urine. Was concerned that the supra pubic catheter was malfunctioning. She has changed it twice herself. Has noted urine in the bag. She noted that he had some bleeding from skin breakdown his perineum. Called her Dr. was told to come here for evaluation  The history is provided by the spouse and the patient.    Past Medical History  Diagnosis Date  . Personal history of other diseases of circulatory system   . Other and unspecified coagulation defects   . Depressive disorder, not elsewhere classified   . Pure hypercholesterolemia   . Radiotherapy   . Rectal fistula   . Personal history of tobacco use, presenting hazards to health     1-1 1/2 ppd for 50 years  . Malignant neoplasm of bladder, part unspecified   . Other chronic cystitis   . Intestinovesical fistula   . Gross hematuria   . Malignant neoplasm of prostate   . Myocardial infarction ([place]) 1992, 1998  . Aortic aneurysm 2008  . Hypertension   . Tendinitis   . Breathing problem     shallow/ chronic productive cough  . Bronchitis   . GERD (gastroesophageal reflux disease)   . Headache(784.0)     sinus  . Coronary artery disease   . Left bundle branch block   . Prostate cancer   . Bladder cancer    Past Surgical History  Procedure Laterality Date  . Colon surgery  1966    removed 6 " colon  . Colon surgery  1968    scar tissue  .  Balloon dilation  1992    heart attack  . Angioplasty  1993  . Bypass graft  1998    heart attack  . Prostate ca  04/1997    Dr. Valere Dross  . Radiation seeding implant  05/1997  . Coloc cauterization  05/1998  . Bladder ca  08/2006  . Cystoscopy  1999    with biopsy x2  . Spt tube  2011  . Prostate surgery  1998    seed inplantation  radiation treatments  . Coronary artery bypass graft    . Cardiac catheterization    . Cataract extraction    . Laparoscopic diverted colostomy  08/04/2012    Procedure: LAPAROSCOPIC DIVERTED COLOSTOMY;  Surgeon: Edward Jolly, MD;  Location: [facility];  Service: General;  Laterality: N/A;  Laparoscopic Colostomy, surgery start time 13:24  . Cystostomy  08/04/2012    Procedure: CYSTOSTOMY SUPRAPUBIC;  Surgeon: Bernestine Amass, MD;  Location: [facility];  Service: Urology;  Laterality: N/A;  Cysto via Supra Pubic Tract and Insertion of New Suprapubic Tube, possible Bladder Biopsy, PROCEDURE START 1300, STOP 1314  . Cystoscopy N/A 10/22/2012    Procedure: CYSTOSCOPY;  Surgeon: Bernestine Amass, MD;  Location: [facility];  Service: Urology;  Laterality: N/A;  Flexible CYSTOSCOPY VIA SUPRAPUBIC TRACT, HOLMIUM LASER LITHOTRIPSY, NEW SUPRAPUBIC TUBE INSERTION   .  Insertion of suprapubic catheter N/A 10/22/2012    Procedure: INSERTION OF SUPRAPUBIC CATHETER;  Surgeon: Bernestine Amass, MD;  Location: [facility];  Service: Urology;  Laterality: N/A;  . Holmium laser application N/A [id number]    Procedure: HOLMIUM LASER APPLICATION;  Surgeon: Bernestine Amass, MD;  Location: [facility];  Service: Urology;  Laterality: N/A;   Family History  Problem Relation Age of Onset  . Stroke Mother     Ischemic Stroke  . Cancer Father     prostate cancer   Social History  Substance Use Topics  . Smoking status: Former Smoker -- 50 years    Types: Cigarettes    Quit date: 07/02/1996  . Smokeless tobacco: Never Used     Comment: quit 1998  . Alcohol Use: No    Review of Systems  All other  systems reviewed and are negative.     Allergies  Influenza vac split; Percocet; Tape; Ciprofloxacin; and Latex  Home Medications   Prior to Admission medications   Medication Sig Start Date End Date Taking? Authorizing Provider  apixaban (ELIQUIS) 5 MG TABS tablet Take 1 tablet (5 mg total) by mouth 2 (two) times daily. 08/01/15   Belva Crome, MD  buPROPion (WELLBUTRIN) 100 MG tablet Take 100 mg by mouth 2 (two) times daily.  07/16/12   Historical Provider, MD  cefUROXime (CEFTIN) 500 MG tablet Take 1 tablet (500 mg total) by mouth 2 (two) times daily with a meal. 11/30/12   Josetta Huddle, MD  cephALEXin (KEFLEX) 500 MG capsule Take 500 mg by mouth daily.    Historical Provider, MD  Cholecalciferol (VITAMIN D3) 50000 UNITS CAPS Take 5,000 Units by mouth daily.    Historical Provider, MD  citalopram (CELEXA) 40 MG tablet Take 40 mg by mouth daily. 11/09/14   Historical Provider, MD  Cranberry 500 MG CAPS Take 500 mg by mouth daily.    Historical Provider, MD  dextromethorphan-guaiFENesin (MUCINEX DM) 30-600 MG per 12 hr tablet Take 2 tablets by mouth every 12 (twelve) hours.    Historical Provider, MD  digoxin (LANOXIN) 0.125 MG tablet Take 1 tablet (0.125 mg total) by mouth daily. 11/23/14   Belva Crome, MD  furosemide (LASIX) 80 MG tablet Take 1 tablet (80 mg total) by mouth 2 (two) times daily. 11/30/12   Josetta Huddle, MD  Memantine HCl ER (NAMENDA XR TITRATION PACK) 7 & 14 & 21 &28 MG CP24 Take one (1) tablet by mouth daily as directed by graduate package.    Historical Provider, MD  metoprolol tartrate (LOPRESSOR) 25 MG tablet Take 0.5 tablets (12.5 mg total) by mouth 2 (two) times daily. 01/11/15   Belva Crome, MD  Multiple Vitamins-Minerals (MULTIVITAMIN WITH MINERALS) tablet Take 1 tablet by mouth daily.    Historical Provider, MD  OVER THE COUNTER MEDICATION Take 100 mg by mouth 3 (three) times daily. (stool softener)    Historical Provider, MD  OVER THE COUNTER MEDICATION Take 5 mg by  mouth 2 (two) times daily. (fiber)    Historical Provider, MD  potassium chloride SA (K-DUR,KLOR-CON) 20 MEQ tablet Take 1 tablet (20 mEq total) by mouth 2 (two) times daily. 11/30/12   Josetta Huddle, MD  simvastatin (ZOCOR) 40 MG tablet Take 40 mg by mouth at bedtime. 12/25/14   Historical Provider, MD   BP 104/59 mmHg  Pulse 62  Temp(Src) 98.1 F (36.7 C) (Oral)  Resp 20  SpO2 93% Physical Exam  Constitutional: He appears well-developed and  well-nourished.  Non-toxic appearance. No distress.  HENT:  Head: Normocephalic and atraumatic.  Eyes: Conjunctivae, EOM and lids are normal. Pupils are equal, round, and reactive to light.  Neck: Normal range of motion. Neck supple. No tracheal deviation present. No thyroid mass present.  Cardiovascular: Normal rate, regular rhythm and normal heart sounds.  Exam reveals no gallop.   No murmur heard. Pulmonary/Chest: Effort normal and breath sounds normal. No stridor. No respiratory distress. He has no decreased breath sounds. He has no wheezes. He has no rhonchi. He has no rales.  Abdominal: Soft. Normal appearance and bowel sounds are normal. He exhibits no distension. There is no tenderness. There is no rebound and no CVA tenderness.    Genitourinary:  Skin breakdown noted in his perineum. Areas of excoriation as well as erythema appreciated. Warm to the touch without fluctuance.  Musculoskeletal: Normal range of motion. He exhibits no edema or tenderness.  Neurological: He is alert. No cranial nerve deficit or sensory deficit. GCS eye subscore is 4. GCS verbal subscore is 5. GCS motor subscore is 6.  Skin: Skin is warm and dry. Rash noted.  Psychiatric: His affect is blunt. His speech is delayed. He is slowed.  Nursing note and vitals reviewed.   ED Course  Procedures (including critical care time) Labs Review Labs Reviewed  URINE CULTURE  CBC WITH DIFFERENTIAL/PLATELET  BASIC METABOLIC PANEL  URINALYSIS, ROUTINE W REFLEX MICROSCOPIC (NOT  AT [facility])    Imaging Review No results found. I have personally reviewed and evaluated these images and lab results as part of my medical decision-making.   EKG Interpretation None      MDM   Final diagnoses:  None    Patient started on IV Rocephin. Spoke with urologist on call, Dr. Alinda Money, he reviewed the patient's CAT scan and states that his team will see the patient in morning. Will admit to the medicine service    Lacretia Leigh, MD 10/09/15 2320

## 2015-10-09 NOTE — ED Notes (Signed)
Pt arrives to the ER via EMS for complaints of supra pubic catheter "not draining"; pt called his MD and they advised to bring him here for evaluation; EMS reports that pt had bloody urine coming from his rectum and his penis; EMS reports that they emptied 649ml of bloody urine prior to transport; pt with known fistula to his rectum; pt alert upon arrival in no acute distress

## 2015-10-09 NOTE — ED Notes (Signed)
Bed: RESB Expected date:  Expected time:  Means of arrival:  Comments: EMS catheter problem

## 2015-10-09 NOTE — H&P (Signed)
[facility] History and Physical  Zachary Haas [id number] DOB: 06-09-1928 DOA: 10/09/2015  Referring physician: ED physician PCP: Henrine Screws, MD  Specialists:   Chief Complaint: supra pubic catheter "not draining" and erythema and redness in perineum/sacral area.  HPI: Zachary Haas is a 82 y.o. male with PMH of bladder cancer, prostate cancer, suprapubic catheter secondary to prostate and bladder CA, rectourethral fistula, hypertension, hyperlipidemia, GERD, depression, AAA, who presents with supra pubic catheter problem and redness in perineum/sacral area.  Per his wife, pt has history of a suprapubic catheter secondary to prostate and bladder CA. He also has history of rectourethral fistula. He has chronic draining from his rectum of urine. Wife states that his supra pubic catheter seems not be draining well today. He has some abdominal spasm. Wife noted that pt had some bleeding from skin breakdown to his sacral/perineal areas. Called her Dr. and was told to come ED for evaluation. Pt has nausea, but no vomiting, diarrhea or abdominal pain. No chest pain, shortness, cough, fever or chills. No unilateral weakness.  In ED, patient was found to have positive urinalysis with large amount of leukocytes, and positive nitrates, WBC 11.2, temperature normal, potassium 3.2, worsening renal function, lactate 2.23. Patient is admitted to inpatient for further evaluation treatment. Urology, Dr. Alinda Money, was consulted by EDP, will see in AM.  CT per renal stone study protocol showed urinary bladder appears to be collapsed around the suprapubic catheter; 3 mm left ureteral calculus just below the level of the iliac vasculature. Mild hydronephrosis on the left. 3. 9 mm calculus in the urinary bladder at the level of the right ureterovesical junction. Mild right hydronephrosis; at least 3 additional urinary bladder calculi, measuring up to 1.9 cm. There also is a 3.4 cm calculus in the  vesicle rectal fistula; cholelithiasis; unchanged 5.9 cm infrarenal abdominal aortic aneurysm.  EKG: Not done in ED, will get one.   Where does patient live?   At home  Can patient participate in ADLs?   Barely    Review of Systems:   General: no fevers, chills, no changes in body weight, has fatigue HEENT: no blurry vision, hearing changes or sore throat Pulm: no dyspnea, coughing, wheezing CV: no chest pain, no palpitations Abd: has nausea, no vomiting, abdominal pain, diarrhea, constipation. Has abdominal spasm. GU: no dysuria, burning on urination, increased urinary frequency. Ext: has mild leg edema Neuro: no unilateral weakness, numbness, or tingling, no vision change or hearing loss Skin: no rash MSK: No muscle spasm, no deformity, no limitation of range of movement in spin Heme: No easy bruising.  Travel history: No recent long distant travel.  Allergy:  Allergies  Allergen Reactions  . Influenza Vac Split [Flu Virus Vaccine] Other (See Comments)    Gets the flu  . Percocet [Oxycodone-Acetaminophen] Anaphylaxis and Shortness Of Breath  . Tape Other (See Comments)    Blisters skin---------------------USE PAPER TAPE  . Ciprofloxacin Tinitus  . Latex Rash    Past Medical History  Diagnosis Date  . Personal history of other diseases of circulatory system   . Other and unspecified coagulation defects   . Depressive disorder, not elsewhere classified   . Pure hypercholesterolemia   . Radiotherapy   . Rectal fistula   . Personal history of tobacco use, presenting hazards to health     1-1 1/2 ppd for 50 years  . Malignant neoplasm of bladder, part unspecified   . Other chronic cystitis   . Intestinovesical fistula   .  Gross hematuria   . Malignant neoplasm of prostate ([place])   . Myocardial infarction ([place]) 1992, 1998  . Aortic aneurysm ([place]) 2008  . Hypertension   . Tendinitis   . Breathing problem     shallow/ chronic productive cough  . Bronchitis   . GERD  (gastroesophageal reflux disease)   . Headache(784.0)     sinus  . Coronary artery disease   . Left bundle branch block   . Prostate cancer ([place])   . Bladder cancer ([place])   . CKD (chronic kidney disease), stage II     Past Surgical History  Procedure Laterality Date  . Colon surgery  1966    removed 6 " colon  . Colon surgery  1968    scar tissue  . Balloon dilation  1992    heart attack  . Angioplasty  1993  . Bypass graft  1998    heart attack  . Prostate ca  04/1997    Dr. Valere Dross  . Radiation seeding implant  05/1997  . Coloc cauterization  05/1998  . Bladder ca  08/2006  . Cystoscopy  1999    with biopsy x2  . Spt tube  2011  . Prostate surgery  1998    seed inplantation  radiation treatments  . Coronary artery bypass graft    . Cardiac catheterization    . Cataract extraction    . Laparoscopic diverted colostomy  08/04/2012    Procedure: LAPAROSCOPIC DIVERTED COLOSTOMY;  Surgeon: Edward Jolly, MD;  Location: [facility];  Service: General;  Laterality: N/A;  Laparoscopic Colostomy, surgery start time 13:24  . Cystostomy  08/04/2012    Procedure: CYSTOSTOMY SUPRAPUBIC;  Surgeon: Bernestine Amass, MD;  Location: [facility];  Service: Urology;  Laterality: N/A;  Cysto via Supra Pubic Tract and Insertion of New Suprapubic Tube, possible Bladder Biopsy, PROCEDURE START 1300, STOP 1314  . Cystoscopy N/A 10/22/2012    Procedure: CYSTOSCOPY;  Surgeon: Bernestine Amass, MD;  Location: [facility];  Service: Urology;  Laterality: N/A;  Flexible CYSTOSCOPY VIA SUPRAPUBIC TRACT, HOLMIUM LASER LITHOTRIPSY, NEW SUPRAPUBIC TUBE INSERTION   . Insertion of suprapubic catheter N/A 10/22/2012    Procedure: INSERTION OF SUPRAPUBIC CATHETER;  Surgeon: Bernestine Amass, MD;  Location: [facility];  Service: Urology;  Laterality: N/A;  . Holmium laser application N/A [id number]    Procedure: HOLMIUM LASER APPLICATION;  Surgeon: Bernestine Amass, MD;  Location: [facility];  Service: Urology;  Laterality: N/A;    Social  History:  reports that he quit smoking about 19 years ago. His smoking use included Cigarettes. He quit after 50 years of use. He has never used smokeless tobacco. He reports that he does not drink alcohol or use illicit drugs.  Family History:  Family History  Problem Relation Age of Onset  . Stroke Mother     Ischemic Stroke  . Cancer Father     prostate cancer     Prior to Admission medications   Medication Sig Start Date End Date Taking? Authorizing Provider  apixaban (ELIQUIS) 5 MG TABS tablet Take 1 tablet (5 mg total) by mouth 2 (two) times daily. 08/01/15  Yes Belva Crome, MD  buPROPion (WELLBUTRIN) 100 MG tablet Take 100 mg by mouth 2 (two) times daily.  07/16/12  Yes Historical Provider, MD  Cholecalciferol (VITAMIN D3) 50000 UNITS CAPS Take 5,000 Units by mouth daily.   Yes Historical Provider, MD  citalopram (CELEXA) 40 MG tablet Take 40 mg by  mouth daily. 11/09/14  Yes Historical Provider, MD  Cranberry 500 MG CAPS Take 500 mg by mouth daily.   Yes Historical Provider, MD  dextromethorphan-guaiFENesin (MUCINEX DM) 30-600 MG per 12 hr tablet Take 1 tablet by mouth every 12 (twelve) hours.    Yes Historical Provider, MD  digoxin (LANOXIN) 0.125 MG tablet Take 1 tablet (0.125 mg total) by mouth daily. 11/23/14  Yes Belva Crome, MD  docusate sodium (COLACE) 100 MG capsule Take 300 mg by mouth at bedtime.   Yes Historical Provider, MD  donepezil (ARICEPT) 10 MG tablet Take 10 mg by mouth daily. 08/24/15  Yes Historical Provider, MD  furosemide (LASIX) 80 MG tablet Take 1 tablet (80 mg total) by mouth 2 (two) times daily. 11/30/12  Yes Josetta Huddle, MD  memantine (NAMENDA) 10 MG tablet Take 10 mg by mouth 2 (two) times daily.   Yes Historical Provider, MD  metoprolol tartrate (LOPRESSOR) 25 MG tablet Take 0.5 tablets (12.5 mg total) by mouth 2 (two) times daily. 01/11/15  Yes Belva Crome, MD  Multiple Vitamins-Minerals (MULTIVITAMIN WITH MINERALS) tablet Take 1 tablet by mouth daily.    Yes Historical Provider, MD  OVER THE COUNTER MEDICATION Take 5 g by mouth 2 (two) times daily. (fiber)   Yes Historical Provider, MD  potassium chloride SA (K-DUR,KLOR-CON) 20 MEQ tablet Take 1 tablet (20 mEq total) by mouth 2 (two) times daily. 11/30/12  Yes Josetta Huddle, MD  simvastatin (ZOCOR) 40 MG tablet Take 40 mg by mouth at bedtime. 12/25/14  Yes Historical Provider, MD  trimethoprim (TRIMPEX) 100 MG tablet Take 100 mg by mouth every evening.   Yes Historical Provider, MD    Physical Exam: Filed Vitals:   10/09/15 2140 10/09/15 2151  BP: 104/59   Pulse: 62   Temp: 98.1 F (36.7 C) 99.7 F (37.6 C)  TempSrc: Oral Rectal  Resp: 20   SpO2: 93%    General: Not in acute distress HEENT:       Eyes: PERRL, EOMI, no scleral icterus.       ENT: No discharge from the ears and nose, no pharynx injection, no tonsillar enlargement.        Neck: No JVD, no bruit, no mass felt. Heme: No neck lymph node enlargement. Cardiac: S1/S2, RRR, No murmurs, No gallops or rubs. Pulm: No rales, wheezing, rhonchi or rubs. Abd: Soft, nondistended, nontender, no rebound pain, no organomegaly, BS present. Has suprapubic catheter with clean surroundings Ext: has trace leg edema bilaterally. 2+DP/PT pulse bilaterally. Musculoskeletal: No joint deformities, No joint redness or warmth, no limitation of ROM in spin. Skin: has skin tear over sacral/perneal aeras Neuro: Alert, oriented X3, cranial nerves II-XII grossly intact, moves all extremities normally. Psych: Patient is not psychotic, no suicidal or hemocidal ideation.  Labs on Admission:  Basic Metabolic Panel:  Recent Labs Lab 10/09/15 2232  NA 137  K 3.2*  CL 97*  CO2 29  GLUCOSE 114*  BUN 18  CREATININE 1.35*  CALCIUM 8.9   Liver Function Tests: No results for input(s): AST, ALT, ALKPHOS, BILITOT, PROT, ALBUMIN in the last 168 hours. No results for input(s): LIPASE, AMYLASE in the last 168 hours. No results for input(s): AMMONIA in the  last 168 hours. CBC:  Recent Labs Lab 10/09/15 2232  WBC 11.2*  NEUTROABS 7.8*  HGB 12.7*  HCT 38.3*  MCV 94.6  PLT 206   Cardiac Enzymes: No results for input(s): CKTOTAL, CKMB, CKMBINDEX, TROPONINI in the last 168 hours.  BNP (last 3 results) No results for input(s): BNP in the last 8760 hours.  ProBNP (last 3 results) No results for input(s): PROBNP in the last 8760 hours.  CBG: No results for input(s): GLUCAP in the last 168 hours.  Radiological Exams on Admission: Ct Renal Stone Study  10/09/2015  CLINICAL DATA:  Suprapubic catheter not draining. EXAM: CT ABDOMEN AND PELVIS WITHOUT CONTRAST TECHNIQUE: Multidetector CT imaging of the abdomen and pelvis was performed following the standard protocol without IV contrast. COMPARISON:  11/18/2014 FINDINGS: The suprapubic catheter extends into the urinary bladder. The bladder is collapsed around the catheter. There are multiple calculi in the bladder, new from 11/18/2014. These measure up to 1.9 cm. The calculus in the rectovesical fistula is again evident, measuring around 3.4 cm. There is mild hydronephrosis and hydroureter bilaterally. On the right there is a 9 mm calculus at the ureterovesical junction. On the left there is a 3 mm calculus of the ureter just below the iliac vasculature. There is cholelithiasis. There are unremarkable unenhanced appearances of the liver and bile ducts. There are unremarkable unenhanced appearances of the spleen, pancreas and adrenals. There is an infrarenal abdominal aortic aneurysm with maximum AP diameter 5.9 cm. It measured 5.8 cm on 11/18/2014, and the difference is likely technical. No retroperitoneal hemorrhage. Stomach and small bowel are unremarkable. Colon is unremarkable. Left lower quadrant ostomy is unremarkable. No acute inflammatory changes are evident in the abdomen or pelvis. There is no ascites. There is no adenopathy. There is no significant abnormality in the lower chest. There is no  significant skeletal lesion. IMPRESSION: 1. Urinary bladder appears to be collapsed around the suprapubic catheter. 2. 3 mm left ureteral calculus just below the level of the iliac vasculature. Mild hydronephrosis on the left. 3. 9 mm calculus in the urinary bladder at the level of the right ureterovesical junction. Mild right hydronephrosis. 4. At least 3 additional urinary bladder calculi, measuring up to 1.9 cm. There also is a 3.4 cm calculus in the vesicle rectal fistula. 5. Cholelithiasis. 6. Unchanged 5.9 cm infrarenal abdominal aortic aneurysm. Electronically Signed   By: Andreas Newport M.D.   On: 10/09/2015 22:33    Assessment/Plan Principal Problem:   UTI (lower urinary tract infection) Active Problems:   Rectourethral fistula   GERD (gastroesophageal reflux disease)   Pure hypercholesterolemia   Hypertension   Right bundle branch block (RBBB) with left anterior hemiblock   S/P CABG x 4   Chronic diastolic HF (heart failure), NYHA class 3 (HCC)   Hypokalemia   Atrial fibrillation, persistent (HCC)   Sepsis due to urinary tract infection ([place])   Bilateral hydronephrosis   Acute on chronic kidney failure-II   Complicated UTI and sepsis due to UTI: Patient has history of Pseudomonas in urine in the past. Now presents with positive urinalysis. He is septic with leukocytosis, elevated lactate and mild tachypnea. Hemodynamically stable.  -will admit to tele bed (due to Afib) - Ceftriaxone was started in ED, will switch to Zosyn - Follow up results of urine and blood cx and amend antibiotic regimen if needed per sensitivity results - prn Zofran for nausea - will get Procalcitonin and trend lactic acid levels per sepsis protocol. - IVF: 2L of NS bolus in ED, followed by 100 cc/h (patient has congestive heart failure, limiting aggressive IV fluids treatment).  Bilateral hydronephrosis: urinary bladder appears to be collapsed around the suprapubic catheter; 3 mm left ureteral  calculus just below the level of the iliac vasculature.  Mild hydronephrosis on the left. 3. 9 mm calculus in the urinary bladder at the level of the right ureterovesical junction. Mild right hydronephrosis; at least 3 additional urinary bladder calculi, measuring up to 1.9 cm. There also is a 3.4 cm calculus in the vesicle rectal fistula. -Urology was consulted, with follow-up recommendations. -NPO after MN  HLD: Last LDL was 83 on 09/13/08 -Continue home medications: Zocor   Dementia: -Donepezil and Namenda  Depression and anxiety: Stable, no suicidal or homicidal ideations. -Continue home medications: Celexa and Wellbutrin  HTN: -Continue metoprolol -Hold Lasix due to worsening renal function and sepsis -IV hydralazine when necessary  Chronic diastolic HF (heart failure), NYHA class 3: 2-D echo on 10/13/14 showed EF of 55-60%. Patient is on Lasix 80 mg twice a day. He has trace amount of leg edema on admission. CHF is compensated. -Hold Lasix due to worsening renal function to sepsis -Check BNP -Continue metoprolol  Hypokalemia: K=3.2 on admission. - Repleted - Check Mg level  Atrial Fibrillation: CHA2DS2-VASc Score is 4, needs oral anticoagulation. Patient is on Eliquis at home. Heart rate is well controlled. -Hold Eliquis in case pt needs procedure -Continue metoprolol and digoxin -check digoxin level  Decubitus ulcer:  Due to rectourethral fistula. Pt has skin tear over sacral/perneal areas. -WC consult  CAD: S/P CABG x 4. No CP. -continue zocor and metoprolol  AoCKD-II: Baseline Cre is 1.0, his Cre is 1.35 on admission. Likely due to bilateral hydronephrosis -Follow up renal function by BMP -Follow-up urologist recommendation   DVT ppx: SCD  Code Status: DNR Family Communication: Yes, patient's wife, son and daughter  at bed side Disposition Plan: Admit to inpatient   Date of Service 10/09/2015    Ivor Costa [facility] Pager [phone]  If 7PM-7AM,  please contact night-coverage [email or web address] Password [email or web address] 10/09/2015, 11:50 PM

## 2015-10-10 DIAGNOSIS — N189 Chronic kidney disease, unspecified: Secondary | ICD-10-CM

## 2015-10-10 DIAGNOSIS — A419 Sepsis, unspecified organism: Principal | ICD-10-CM

## 2015-10-10 DIAGNOSIS — N1330 Unspecified hydronephrosis: Secondary | ICD-10-CM

## 2015-10-10 DIAGNOSIS — L8990 Pressure ulcer of unspecified site, unspecified stage: Secondary | ICD-10-CM | POA: Diagnosis present

## 2015-10-10 DIAGNOSIS — E876 Hypokalemia: Secondary | ICD-10-CM

## 2015-10-10 DIAGNOSIS — I10 Essential (primary) hypertension: Secondary | ICD-10-CM

## 2015-10-10 DIAGNOSIS — I481 Persistent atrial fibrillation: Secondary | ICD-10-CM

## 2015-10-10 DIAGNOSIS — N360 Urethral fistula: Secondary | ICD-10-CM

## 2015-10-10 DIAGNOSIS — N390 Urinary tract infection, site not specified: Secondary | ICD-10-CM

## 2015-10-10 DIAGNOSIS — N179 Acute kidney failure, unspecified: Secondary | ICD-10-CM

## 2015-10-10 LAB — APTT: aPTT: 40 seconds — ABNORMAL HIGH (ref 24–37)

## 2015-10-10 LAB — DIGOXIN LEVEL: DIGOXIN LVL: 0.6 ng/mL — AB (ref 0.8–2.0)

## 2015-10-10 LAB — CBC
HEMATOCRIT: 39.6 % (ref 39.0–52.0)
Hemoglobin: 13.2 g/dL (ref 13.0–17.0)
MCH: 32.2 pg (ref 26.0–34.0)
MCHC: 33.3 g/dL (ref 30.0–36.0)
MCV: 96.6 fL (ref 78.0–100.0)
Platelets: 184 10*3/uL (ref 150–400)
RBC: 4.1 MIL/uL — AB (ref 4.22–5.81)
RDW: 15.3 % (ref 11.5–15.5)
WBC: 10.5 10*3/uL (ref 4.0–10.5)

## 2015-10-10 LAB — BASIC METABOLIC PANEL
ANION GAP: 10 (ref 5–15)
BUN: 18 mg/dL (ref 6–20)
CHLORIDE: 97 mmol/L — AB (ref 101–111)
CO2: 27 mmol/L (ref 22–32)
Calcium: 8.6 mg/dL — ABNORMAL LOW (ref 8.9–10.3)
Creatinine, Ser: 1.37 mg/dL — ABNORMAL HIGH (ref 0.61–1.24)
GFR calc non Af Amer: 45 mL/min — ABNORMAL LOW (ref >60)
GFR, EST AFRICAN AMERICAN: 53 mL/min — AB (ref >60)
Glucose, Bld: 114 mg/dL — ABNORMAL HIGH (ref 65–99)
POTASSIUM: 3 mmol/L — AB (ref 3.5–5.1)
SODIUM: 134 mmol/L — AB (ref 135–145)

## 2015-10-10 LAB — PROTIME-INR
INR: 1.4 (ref 0.00–1.49)
Prothrombin Time: 16.8 seconds — ABNORMAL HIGH (ref 11.6–15.2)

## 2015-10-10 LAB — MAGNESIUM: Magnesium: 1.9 mg/dL (ref 1.7–2.4)

## 2015-10-10 LAB — PROCALCITONIN: Procalcitonin: <0.1 ng/mL

## 2015-10-10 LAB — BRAIN NATRIURETIC PEPTIDE: B NATRIURETIC PEPTIDE 5: 153.3 pg/mL — AB (ref 0.0–100.0)

## 2015-10-10 LAB — LACTIC ACID, PLASMA: Lactic Acid, Venous: 1.7 mmol/L (ref 0.5–2.0)

## 2015-10-10 MED ORDER — PIPERACILLIN-TAZOBACTAM 3.375 G IVPB: 3.3750 g | Freq: Three times a day (TID) | INTRAVENOUS | Status: DC

## 2015-10-10 MED ORDER — POTASSIUM CHLORIDE CRYS ER 20 MEQ PO TBCR
20.0000 meq | EXTENDED_RELEASE_TABLET | Freq: Two times a day (BID) | ORAL | Status: DC
  Administered 2015-10-10 – 2015-10-13 (×7): 20 meq via ORAL
  Filled 2015-10-10 (×7): qty 1

## 2015-10-10 MED ORDER — PIPERACILLIN-TAZOBACTAM 3.375 G IVPB
3.3750 g | Freq: Three times a day (TID) | INTRAVENOUS | Status: DC
  Administered 2015-10-10 – 2015-10-13 (×12): 3.375 g via INTRAVENOUS
  Filled 2015-10-10 (×9): qty 50

## 2015-10-10 NOTE — Consult Note (Signed)
WOC consulted for skin tear over sacrum, discussed with bedside nurse.  This area is related to moisture from chronic draining of urine from rectourethral fistula and leakage from his SP cath that drains back onto this skin as well.  Unfortunately, with MASD (moisture associated skin damage) we are quite limited on topical care.  Since surgical intervention is not an option for the fistula, our best option for management is barrier creams to protect the skin from insult from the urine.   Bedside nurses will continue to use barrier cream at least Q shift and after episode of incontinence.   Discussed POC with bedside nurse.  Re consult if needed, will not follow at this time. Thanks  Vincenzina Jagoda Kellogg, [place] [phone])

## 2015-10-10 NOTE — Progress Notes (Signed)
PT Cancellation Note  Patient Details Name: Zachary Haas MRN: [id number] DOB: 05-21-1928   Cancelled Treatment:    Reason Eval/Treat Not Completed: PT screened, no needs identified. Spoke with family who denied need for therapy. Will sign off. Instructed family to ask for reorder if needs change.    Weston Anna, MPT Pager: [phone]

## 2015-10-10 NOTE — Progress Notes (Signed)
[facility] PROGRESS NOTE    Progress Note   Zachary Haas [id number] DOB: 10-10-1927 DOA: 2015/11/03 PCP: Henrine Screws, MD   Brief Narrative:   Zachary Haas is an 82 y.o. male past medical history of bladder cancer status post suprapubic catheter placement without rectourethral fistula, dedicated CT renal protocol showed a 3 mm left ureteral stone with mild hydronephrosis on the left  Assessment/Plan:   Sepsis due to Complicated UTI (lower urinary tract infection) Patient has a history of Pseudomonas with a UA compatible for urinary tract infection. Lactic acid did improve after fluid resuscitation from 2.2-1.7. Continue empiric IV Zosyn, blood cultures and urine cultures are pending. Patient was fluid resuscitated in the ED,Blood pressure seems to be stable heart rate has improved.  Bilateral hydronephrosis left ureteral stone: Left ureteral stones. To be causing left hydronephrosis but it seems new. Urology was consulted who recommended to hold Eluquis for possible retrograde ureteral stenting. Keep the patient nothing by mouth.  Acute on chronic kidney failure-II: Likely due to obstructive uropathy, awaiting urology recommendations.  Hyperlipidemia: Continue Zocor.  Dementia: Continued home regimen.  Essential hypertension: Continue to hold Lasix, heart rate rate control continue metoprolol.  Chronic diastolic heart failure: Lasix has been held, due to worsening creatinine. He seems to be euvolemic, continue metoprolol, he has had a mild elevation in his BNP is likely reactive the patient doesn't have signs of fluid overload. GERD (gastroesophageal reflux disease)  Hypokalemia Replete orally.  Atrial fibrillation, persistent ([place]) Hold Eluquis rate controlled continue metoprolol.  Sacral Decubitus ulcer: That wound care consult.  DVT Prophylaxis SCD's.  Family Communication: wife Disposition Plan: Home 2-3 days Code Status:     Code  Status Orders        Start     Ordered   11/03/2015 2346  Do not attempt resuscitation (DNR)   Continuous    Question Answer Comment  In the event of cardiac or respiratory ARREST Do not call a "code blue"   In the event of cardiac or respiratory ARREST Do not perform Intubation, CPR, defibrillation or ACLS   In the event of cardiac or respiratory ARREST Use medication by any route, position, wound care, and other measures to relive pain and suffering. May use oxygen, suction and manual treatment of airway obstruction as needed for comfort.      11/03/15 2346    Code Status History    Date Active Date Inactive Code Status Order ID Comments User Context   11/25/2012  8:26 PM 12/01/2012  3:13 PM Full Code [id number]  Sorin June Leap, MD Inpatient   08/04/2012  4:28 PM 08/09/2012  3:53 PM Full Code [id number]  Diamantina Monks, RN Inpatient    Advance Directive Documentation        Most Recent Value   Type of Advance Directive  Living will   Pre-existing out of facility DNR order (yellow form or pink MOST form)     "MOST" Form in Place?          IV Access:    Peripheral IV   Procedures and diagnostic studies:   Ct Renal Stone Study  11-03-15  CLINICAL DATA:  Suprapubic catheter not draining. EXAM: CT ABDOMEN AND PELVIS WITHOUT CONTRAST TECHNIQUE: Multidetector CT imaging of the abdomen and pelvis was performed following the standard protocol without IV contrast. COMPARISON:  11/18/2014 FINDINGS: The suprapubic catheter extends into the urinary bladder. The bladder is collapsed around the catheter. There are multiple calculi in the bladder,  new from 11/18/2014. These measure up to 1.9 cm. The calculus in the rectovesical fistula is again evident, measuring around 3.4 cm. There is mild hydronephrosis and hydroureter bilaterally. On the right there is a 9 mm calculus at the ureterovesical junction. On the left there is a 3 mm calculus of the ureter just below the iliac vasculature. There is  cholelithiasis. There are unremarkable unenhanced appearances of the liver and bile ducts. There are unremarkable unenhanced appearances of the spleen, pancreas and adrenals. There is an infrarenal abdominal aortic aneurysm with maximum AP diameter 5.9 cm. It measured 5.8 cm on 11/18/2014, and the difference is likely technical. No retroperitoneal hemorrhage. Stomach and small bowel are unremarkable. Colon is unremarkable. Left lower quadrant ostomy is unremarkable. No acute inflammatory changes are evident in the abdomen or pelvis. There is no ascites. There is no adenopathy. There is no significant abnormality in the lower chest. There is no significant skeletal lesion. IMPRESSION: 1. Urinary bladder appears to be collapsed around the suprapubic catheter. 2. 3 mm left ureteral calculus just below the level of the iliac vasculature. Mild hydronephrosis on the left. 3. 9 mm calculus in the urinary bladder at the level of the right ureterovesical junction. Mild right hydronephrosis. 4. At least 3 additional urinary bladder calculi, measuring up to 1.9 cm. There also is a 3.4 cm calculus in the vesicle rectal fistula. 5. Cholelithiasis. 6. Unchanged 5.9 cm infrarenal abdominal aortic aneurysm. Electronically Signed   By: Andreas Newport M.D.   On: 10/09/2015 22:33     Medical Consultants:    None.  Anti-Infectives:   Anti-infectives    Start     Dose/Rate Route Frequency Ordered Stop   10/10/15 0100  piperacillin-tazobactam (ZOSYN) IVPB 3.375 g     3.375 g 12.5 mL/hr over 240 Minutes Intravenous Every 8 hours 10/10/15 0048     10/10/15 0015  piperacillin-tazobactam (ZOSYN) IVPB 3.375 g  Status:  Discontinued     3.375 g 12.5 mL/hr over 240 Minutes Intravenous 3 times per day 10/10/15 0009 10/10/15 0048   10/10/15 0000  trimethoprim (TRIMPEX) tablet 100 mg     100 mg Oral Every evening 10/09/15 2343     10/09/15 2315  cefTRIAXone (ROCEPHIN) 1 g in dextrose 5 % 50 mL IVPB  Status:  Discontinued       1 g 100 mL/hr over 30 Minutes Intravenous Every 24 hours 10/09/15 2301 10/10/15 0009      Subjective:    [name] awake alert able to carry on a conversation has no new complaints.  Objective:    Filed Vitals:   10/09/15 2300 10/09/15 2330 10/10/15 0048 10/10/15 0500  BP: 96/55 104/46 118/31 110/86  Pulse: 56 55 71 78  Temp:   98.2 F (36.8 C) 98.4 F (36.9 C)  TempSrc:   Oral Oral  Resp:   18 17  Height:   5\' 8"  (1.727 m)   Weight:   97.387 kg (214 lb 11.2 oz)   SpO2: 95% 95% 95% 95%    Intake/Output Summary (Last 24 hours) at 10/10/15 0856 Last data filed at 10/10/15 0600  Gross per 24 hour  Intake   2050 ml  Output    725 ml  Net   1325 ml   Filed Weights   10/10/15 0048  Weight: 97.387 kg (214 lb 11.2 oz)    Exam: Gen:  NAD Cardiovascular:  RRR. Chest and lungs:   CTAB Abdomen:  Abdomen soft, NT/ND, + BS Extremities:  No edema   Data Reviewed:    Labs: Basic Metabolic Panel:  Recent Labs Lab 10/09/15 2232 10/10/15 0011  NA 137 134*  K 3.2* 3.0*  CL 97* 97*  CO2 29 27  GLUCOSE 114* 114*  BUN 18 18  CREATININE 1.35* 1.37*  CALCIUM 8.9 8.6*  MG  --  1.9   GFR Estimated Creatinine Clearance: 44.6 mL/min (by C-G formula based on Cr of 1.37). Liver Function Tests: No results for input(s): AST, ALT, ALKPHOS, BILITOT, PROT, ALBUMIN in the last 168 hours. No results for input(s): LIPASE, AMYLASE in the last 168 hours. No results for input(s): AMMONIA in the last 168 hours. Coagulation profile  Recent Labs Lab 10/10/15 0011  INR 1.40    CBC:  Recent Labs Lab 10/09/15 2232 10/10/15 0011  WBC 11.2* 10.5  NEUTROABS 7.8*  --   HGB 12.7* 13.2  HCT 38.3* 39.6  MCV 94.6 96.6  PLT 206 184   Cardiac Enzymes: No results for input(s): CKTOTAL, CKMB, CKMBINDEX, TROPONINI in the last 168 hours. BNP (last 3 results) No results for input(s): PROBNP in the last 8760 hours. CBG: No results for input(s): GLUCAP in the last 168  hours. D-Dimer: No results for input(s): DDIMER in the last 72 hours. Hgb A1c: No results for input(s): HGBA1C in the last 72 hours. Lipid Profile: No results for input(s): CHOL, HDL, LDLCALC, TRIG, CHOLHDL, LDLDIRECT in the last 72 hours. Thyroid function studies: No results for input(s): TSH, T4TOTAL, T3FREE, THYROIDAB in the last 72 hours.  Invalid input(s): FREET3 Anemia work up: No results for input(s): VITAMINB12, FOLATE, FERRITIN, TIBC, IRON, RETICCTPCT in the last 72 hours. Sepsis Labs:  Recent Labs Lab 10/09/15 2232 10/09/15 2243 10/10/15 0011  PROCALCITON  --   --  <0.10  WBC 11.2*  --  10.5  LATICACIDVEN  --  2.23* 1.7   Microbiology Recent Results (from the past 240 hour(s))  Culture, blood (Routine X 2) w Reflex to ID Panel     Status: None (Preliminary result)   Collection Time: 10/09/15 10:33 PM  Result Value Ref Range Status   Specimen Description BLOOD RIGHT ANTECUBITAL  Final   Special Requests   Final    BOTTLES DRAWN AEROBIC AND ANAEROBIC 10ML Performed at [facility]    Culture PENDING  Incomplete   Report Status PENDING  Incomplete  Culture, blood (Routine X 2) w Reflex to ID Panel     Status: None (Preliminary result)   Collection Time: 10/09/15 10:48 PM  Result Value Ref Range Status   Specimen Description BLOOD LEFT ANTECUBITAL  Final   Special Requests   Final    BOTTLES DRAWN AEROBIC AND ANAEROBIC 5ML Performed at [facility]    Culture PENDING  Incomplete   Report Status PENDING  Incomplete     Medications:   . buPROPion  100 mg Oral BID  . cholecalciferol  5,000 Units Oral Daily  . citalopram  40 mg Oral Daily  . dextromethorphan-guaiFENesin  1 tablet Oral Q12H  . digoxin  0.125 mg Oral Daily  . docusate sodium  300 mg Oral QHS  . donepezil  10 mg Oral Daily  . memantine  10 mg Oral BID  . metoprolol tartrate  12.5 mg Oral BID  . multivitamin with minerals  1 tablet Oral Daily  . piperacillin-tazobactam (ZOSYN)   IV  3.375 g Intravenous Q8H  . simvastatin  40 mg Oral QHS  . sodium chloride flush  3 mL Intravenous  Q12H  . trimethoprim  100 mg Oral QPM   Continuous Infusions: . sodium chloride 20 mL/hr at 10/09/15 2313  . sodium chloride 100 mL/hr at 10/10/15 0159    Time spent: 25 mins.   LOS: 1 day   Charlynne Cousins  [facility] Pager [phone]  *Please refer to [email or web address], password [email or web address] to get updated schedule on who will round on this patient, as hospitalists switch teams weekly. If 7PM-7AM, please contact night-coverage at [email or web address], password [email or web address] for any overnight needs.  10/10/2015, 8:56 AM

## 2015-10-10 NOTE — Progress Notes (Signed)
OT Note  Patient Details Name: Zachary Haas MRN: [id number] DOB: 02-15-1928   Cancelled Treatment:    Reason Eval/Treat Not Completed: OT screened, no needs identified, will sign off -- per PT, patient has no OT needs. Will sign off.  Jaimy Kliethermes A 10/10/2015, 11:56 AM

## 2015-10-10 NOTE — Consult Note (Signed)
Urology Consult   Physician requesting consult: Dr. Blaine Hamper  Reason for consult: Bilateral hydronephrosis with ureteral stone and history of rectourethral fistula  History of Present Illness: Zachary Haas is a 82 y.o. patient with a very complex medical and GU history followed by Dr. Risa Grill in the past.  He has a history of prostate cancer and bladder cancer s/p radiation therapy for his prostate cancer.  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  Per Dr. Cy Blamer notes, he has a history of a fairly extensive bulbar urethral stricture and a history of bladder calculi last treated in 2014.   His wife is with him tonight and is his primary caregiver.  She changes his SP tube at least every 4-5 months and sooner if needed.  Two days ago, he developed decreased UOP from his SP tube with increased drainage from his rectum and penis.  He has also now developed some skin breakdown on his buttocks.  He has had what he describes as bladder spasms recently but denies flank pain, fever, chills, nausea, or vomiting.  His wife changed his SP tube two days agao and then again yesterday but still noted decreased UOP.  She called his primary physician who recommended he be brought to the ED today for evaluation.    A CT scan was performed which indicated that his SP tube was in appropriate position in the bladder.  There are multiple bladder calculi.  There is mild bilateral hydronephrosis with a 3 mm left mid ureteral stone but no obvious right ureteral stone.  There is a 9 mm stone that appears within the bladder but is near the right UVJ.   Notably, EMS noted 600 cc of UOP that they drained prior to coming to the ED and nursing on the floor tonight drained 450 cc.    He has multiple medical comorbidities as noted below in his history including atrial fibrillation managed with Eliquis and he is oxygen dependent  using 3.5 L at home.  Past Medical History  Diagnosis Date  . Personal history of other diseases of circulatory system   . Other and unspecified coagulation defects   . Depressive disorder, not elsewhere classified   . Pure hypercholesterolemia   . Radiotherapy   . Rectal fistula   . Personal history of tobacco use, presenting hazards to health     1-1 1/2 ppd for 50 years  . Malignant neoplasm of bladder, part unspecified   . Other chronic cystitis   . Intestinovesical fistula   . Gross hematuria   . Malignant neoplasm of prostate ([place])   . Myocardial infarction ([place]) 1992, 1998  . Aortic aneurysm ([place]) 2008  . Hypertension   . Tendinitis   . Breathing problem     shallow/ chronic productive cough  . Bronchitis   . GERD (gastroesophageal reflux disease)   . Headache(784.0)     sinus  . Coronary artery disease   . Left bundle branch block   . Prostate cancer ([place])   . Bladder cancer ([place])   . CKD (chronic kidney disease), stage II     Past Surgical History  Procedure Laterality Date  . Colon surgery  1966    removed 6 " colon  . Colon surgery  1968    scar tissue  . Balloon dilation  1992    heart attack  . Angioplasty  1993  . Bypass graft  1998    heart attack  . Prostate ca  04/1997    Dr. Valere Dross  . Radiation seeding implant  05/1997  . Coloc cauterization  05/1998  . Bladder ca  08/2006  . Cystoscopy  1999    with biopsy x2  . Spt tube  2011  . Prostate surgery  1998    seed inplantation  radiation treatments  . Coronary artery bypass graft    . Cardiac catheterization    . Cataract extraction    . Laparoscopic diverted colostomy  08/04/2012    Procedure: LAPAROSCOPIC DIVERTED COLOSTOMY;  Surgeon: Edward Jolly, MD;  Location: [facility];  Service: General;  Laterality: N/A;  Laparoscopic Colostomy, surgery start time 13:24  . Cystostomy  08/04/2012    Procedure: CYSTOSTOMY SUPRAPUBIC;  Surgeon: Bernestine Amass, MD;  Location: [facility];  Service: Urology;   Laterality: N/A;  Cysto via Supra Pubic Tract and Insertion of New Suprapubic Tube, possible Bladder Biopsy, PROCEDURE START 1300, STOP 1314  . Cystoscopy N/A 10/22/2012    Procedure: CYSTOSCOPY;  Surgeon: Bernestine Amass, MD;  Location: [facility];  Service: Urology;  Laterality: N/A;  Flexible CYSTOSCOPY VIA SUPRAPUBIC TRACT, HOLMIUM LASER LITHOTRIPSY, NEW SUPRAPUBIC TUBE INSERTION   . Insertion of suprapubic catheter N/A 10/22/2012    Procedure: INSERTION OF SUPRAPUBIC CATHETER;  Surgeon: Bernestine Amass, MD;  Location: [facility];  Service: Urology;  Laterality: N/A;  . Holmium laser application N/A [id number]    Procedure: HOLMIUM LASER APPLICATION;  Surgeon: Bernestine Amass, MD;  Location: [facility];  Service: Urology;  Laterality: N/A;    Medications:  Home meds:    Medication List    ASK your doctor about these medications        apixaban 5 MG Tabs tablet  Commonly known as:  ELIQUIS  Take 1 tablet (5 mg total) by mouth 2 (two) times daily.     buPROPion 100 MG tablet  Commonly known as:  WELLBUTRIN  Take 100 mg by mouth 2 (two) times daily.     citalopram 40 MG tablet  Commonly known as:  CELEXA  Take 40 mg by mouth daily.     Cranberry 500 MG Caps  Take 500 mg by mouth daily.     dextromethorphan-guaiFENesin 30-600 MG 12hr tablet  Commonly known as:  MUCINEX DM  Take 1 tablet by mouth every 12 (twelve) hours.     digoxin 0.125 MG tablet  Commonly known as:  LANOXIN  Take 1 tablet (0.125 mg total) by mouth daily.     docusate sodium 100 MG capsule  Commonly known as:  COLACE  Take 300 mg by mouth at bedtime.     donepezil 10 MG tablet  Commonly known as:  ARICEPT  Take 10 mg by mouth daily.     furosemide 80 MG tablet  Commonly known as:  LASIX  Take 1 tablet (80 mg total) by mouth 2 (two) times daily.     memantine 10 MG tablet  Commonly known as:  NAMENDA  Take 10 mg by mouth 2 (two) times daily.     metoprolol tartrate 25 MG tablet  Commonly known as:  LOPRESSOR   Take 0.5 tablets (12.5 mg total) by mouth 2 (two) times daily.     multivitamin with minerals tablet  Take 1 tablet by mouth daily.     OVER THE COUNTER MEDICATION  Take 5 g by mouth 2 (two) times daily. (fiber)     potassium chloride  SA 20 MEQ tablet  Commonly known as:  K-DUR,KLOR-CON  Take 1 tablet (20 mEq total) by mouth 2 (two) times daily.     simvastatin 40 MG tablet  Commonly known as:  ZOCOR  Take 40 mg by mouth at bedtime.     trimethoprim 100 MG tablet  Commonly known as:  TRIMPEX  Take 100 mg by mouth every evening.     Vitamin D3 50000 units Caps  Take 5,000 Units by mouth daily.        Scheduled Meds: . buPROPion  100 mg Oral BID  . cholecalciferol  5,000 Units Oral Daily  . citalopram  40 mg Oral Daily  . dextromethorphan-guaiFENesin  1 tablet Oral Q12H  . digoxin  0.125 mg Oral Daily  . docusate sodium  300 mg Oral QHS  . donepezil  10 mg Oral Daily  . memantine  10 mg Oral BID  . metoprolol tartrate  12.5 mg Oral BID  . multivitamin with minerals  1 tablet Oral Daily  . piperacillin-tazobactam (ZOSYN)  IV  3.375 g Intravenous Q8H  . simvastatin  40 mg Oral QHS  . sodium chloride  2,000 mL Intravenous Once  . sodium chloride flush  3 mL Intravenous Q12H  . trimethoprim  100 mg Oral QPM   Continuous Infusions: . sodium chloride 20 mL/hr at 10/09/15 2313  . sodium chloride     PRN Meds:.hydrALAZINE, ondansetron **OR** ondansetron (ZOFRAN) IV  Allergies:  Allergies  Allergen Reactions  . Influenza Vac Split [Flu Virus Vaccine] Other (See Comments)    Gets the flu  . Percocet [Oxycodone-Acetaminophen] Anaphylaxis and Shortness Of Breath  . Tape Other (See Comments)    Blisters skin---------------------USE PAPER TAPE  . Ciprofloxacin Tinitus  . Latex Rash    Family History  Problem Relation Age of Onset  . Stroke Mother     Ischemic Stroke  . Cancer Father     prostate cancer    Social History:  reports that he quit smoking about 19  years ago. His smoking use included Cigarettes. He quit after 50 years of use. He has never used smokeless tobacco. He reports that he does not drink alcohol or use illicit drugs.  ROS: A complete review of systems was performed.  All systems are negative except for pertinent findings as noted.  Increased weakness over the past 6 months since he had a UTI in the fall.  He also has had cognitive decline over the past year according to his wife.  Physical Exam:  Vital signs in last 24 hours: Temp:  [98.1 F (36.7 C)-99.7 F (37.6 C)] 98.2 F (36.8 C) (04/10 0048) Pulse Rate:  [55-71] 71 (04/10 0048) Resp:  [18-20] 18 (04/10 0048) BP: (96-118)/(31-59) 118/31 mmHg (04/10 0048) SpO2:  [93 %-95 %] 95 % (04/10 0048) Weight:  [97.387 kg (214 lb 11.2 oz)] 97.387 kg (214 lb 11.2 oz) (04/10 0048) Constitutional:  Alert and oriented, No acute distress Cardiovascular: Irregularly, irregular Respiratory: Shallow breathing noted, Lungs clear bilaterally GI: Abdomen is soft, nontender, nondistended, no abdominal masses. Colostomy bag noted in the LLQ with stool in the pouch. Genitourinary: Moderate left CVAT. Normal male phallus, testes are descended bilaterally and non-tender and without masses, scrotum demonstrate mild erythema without lesions or masses, perineum is normal on inspection. Rectal: He has stage 2 ulceration of the skin on the buttocks bilaterally. Lymphatic: No lymphadenopathy Neurologic: Grossly intact, no focal deficits Psychiatric: Normal mood and affect  Laboratory Data:   Recent Labs  10/09/15 2232 10/10/15 0011  WBC 11.2* 10.5  HGB 12.7* 13.2  HCT 38.3* 39.6  PLT 206 184     Recent Labs  10/09/15 2232 10/10/15 0011  NA 137 134*  K 3.2* 3.0*  CL 97* 97*  GLUCOSE 114* 114*  BUN 18 18  CALCIUM 8.9 8.6*  CREATININE 1.35* 1.37*     Results for orders placed or performed during the hospital encounter of 10/09/15 (from the past 24 hour(s))  Urinalysis, Routine w  reflex microscopic (not at [facility])     Status: Abnormal   Collection Time: 10/09/15  9:58 PM  Result Value Ref Range   Color, Urine YELLOW YELLOW   APPearance TURBID (A) CLEAR   Specific Gravity, Urine 1.010 1.005 - 1.030   pH 7.0 5.0 - 8.0   Glucose, UA NEGATIVE NEGATIVE mg/dL   Hgb urine dipstick LARGE (A) NEGATIVE   Bilirubin Urine NEGATIVE NEGATIVE   Ketones, ur NEGATIVE NEGATIVE mg/dL   Protein, ur 30 (A) NEGATIVE mg/dL   Nitrite POSITIVE (A) NEGATIVE   Leukocytes, UA LARGE (A) NEGATIVE  Urine microscopic-add on     Status: Abnormal   Collection Time: 10/09/15  9:58 PM  Result Value Ref Range   Squamous Epithelial / LPF 0-5 (A) NONE SEEN   WBC, UA TOO NUMEROUS TO COUNT 0 - 5 WBC/hpf   RBC / HPF TOO NUMEROUS TO COUNT 0 - 5 RBC/hpf   Bacteria, UA MANY (A) NONE SEEN  CBC with Differential/Platelet     Status: Abnormal   Collection Time: 10/09/15 10:32 PM  Result Value Ref Range   WBC 11.2 (H) 4.0 - 10.5 K/uL   RBC 4.05 (L) 4.22 - 5.81 MIL/uL   Hemoglobin 12.7 (L) 13.0 - 17.0 g/dL   HCT 38.3 (L) 39.0 - 52.0 %   MCV 94.6 78.0 - 100.0 fL   MCH 31.4 26.0 - 34.0 pg   MCHC 33.2 30.0 - 36.0 g/dL   RDW 15.0 11.5 - 15.5 %   Platelets 206 150 - 400 K/uL   Neutrophils Relative % 69 %   Neutro Abs 7.8 (H) 1.7 - 7.7 K/uL   Lymphocytes Relative 19 %   Lymphs Abs 2.1 0.7 - 4.0 K/uL   Monocytes Relative 9 %   Monocytes Absolute 1.0 0.1 - 1.0 K/uL   Eosinophils Relative 3 %   Eosinophils Absolute 0.3 0.0 - 0.7 K/uL   Basophils Relative 0 %   Basophils Absolute 0.0 0.0 - 0.1 K/uL  Basic metabolic panel     Status: Abnormal   Collection Time: 10/09/15 10:32 PM  Result Value Ref Range   Sodium 137 135 - 145 mmol/L   Potassium 3.2 (L) 3.5 - 5.1 mmol/L   Chloride 97 (L) 101 - 111 mmol/L   CO2 29 22 - 32 mmol/L   Glucose, Bld 114 (H) 65 - 99 mg/dL   BUN 18 6 - 20 mg/dL   Creatinine, Ser 1.35 (H) 0.61 - 1.24 mg/dL   Calcium 8.9 8.9 - 10.3 mg/dL   GFR calc non Af Amer 46 (L) >60 mL/min    GFR calc Af Amer 54 (L) >60 mL/min   Anion gap 11 5 - 15  I-Stat CG4 Lactic Acid, ED     Status: Abnormal   Collection Time: 10/09/15 10:43 PM  Result Value Ref Range   Lactic Acid, Venous 2.23 (HH) 0.5 - 2.0 mmol/L   Comment NOTIFIED PHYSICIAN   Brain natriuretic peptide     Status: Abnormal  Collection Time: 10/10/15 12:11 AM  Result Value Ref Range   B Natriuretic Peptide 153.3 (H) 0.0 - 100.0 pg/mL  Digoxin level     Status: Abnormal   Collection Time: 10/10/15 12:11 AM  Result Value Ref Range   Digoxin Level 0.6 (L) 0.8 - 2.0 ng/mL  Magnesium     Status: None   Collection Time: 10/10/15 12:11 AM  Result Value Ref Range   Magnesium 1.9 1.7 - 2.4 mg/dL  Lactic acid, plasma     Status: None   Collection Time: 10/10/15 12:11 AM  Result Value Ref Range   Lactic Acid, Venous 1.7 0.5 - 2.0 mmol/L  Procalcitonin     Status: None   Collection Time: 10/10/15 12:11 AM  Result Value Ref Range   Procalcitonin <0.10 ng/mL  Protime-INR     Status: Abnormal   Collection Time: 10/10/15 12:11 AM  Result Value Ref Range   Prothrombin Time 16.8 (H) 11.6 - 15.2 seconds   INR 1.40 0.00 - 1.49  APTT     Status: Abnormal   Collection Time: 10/10/15 12:11 AM  Result Value Ref Range   aPTT 40 (H) 24 - 37 seconds  Basic metabolic panel     Status: Abnormal   Collection Time: 10/10/15 12:11 AM  Result Value Ref Range   Sodium 134 (L) 135 - 145 mmol/L   Potassium 3.0 (L) 3.5 - 5.1 mmol/L   Chloride 97 (L) 101 - 111 mmol/L   CO2 27 22 - 32 mmol/L   Glucose, Bld 114 (H) 65 - 99 mg/dL   BUN 18 6 - 20 mg/dL   Creatinine, Ser 1.37 (H) 0.61 - 1.24 mg/dL   Calcium 8.6 (L) 8.9 - 10.3 mg/dL   GFR calc non Af Amer 45 (L) >60 mL/min   GFR calc Af Amer 53 (L) >60 mL/min   Anion gap 10 5 - 15  CBC     Status: Abnormal   Collection Time: 10/10/15 12:11 AM  Result Value Ref Range   WBC 10.5 4.0 - 10.5 K/uL   RBC 4.10 (L) 4.22 - 5.81 MIL/uL   Hemoglobin 13.2 13.0 - 17.0 g/dL   HCT 39.6 39.0 -  52.0 %   MCV 96.6 78.0 - 100.0 fL   MCH 32.2 26.0 - 34.0 pg   MCHC 33.3 30.0 - 36.0 g/dL   RDW 15.3 11.5 - 15.5 %   Platelets 184 150 - 400 K/uL   No results found for this or any previous visit (from the past 240 hour(s)).  Renal Function:  Recent Labs  10/09/15 2232 10/10/15 0011  CREATININE 1.35* 1.37*   Estimated Creatinine Clearance: 44.6 mL/min (by C-G formula based on Cr of 1.37).  Radiologic Imaging: Ct Renal Stone Study  10/09/2015  CLINICAL DATA:  Suprapubic catheter not draining. EXAM: CT ABDOMEN AND PELVIS WITHOUT CONTRAST TECHNIQUE: Multidetector CT imaging of the abdomen and pelvis was performed following the standard protocol without IV contrast. COMPARISON:  11/18/2014 FINDINGS: The suprapubic catheter extends into the urinary bladder. The bladder is collapsed around the catheter. There are multiple calculi in the bladder, new from 11/18/2014. These measure up to 1.9 cm. The calculus in the rectovesical fistula is again evident, measuring around 3.4 cm. There is mild hydronephrosis and hydroureter bilaterally. On the right there is a 9 mm calculus at the ureterovesical junction. On the left there is a 3 mm calculus of the ureter just below the iliac vasculature. There is cholelithiasis. There are unremarkable unenhanced  appearances of the liver and bile ducts. There are unremarkable unenhanced appearances of the spleen, pancreas and adrenals. There is an infrarenal abdominal aortic aneurysm with maximum AP diameter 5.9 cm. It measured 5.8 cm on 11/18/2014, and the difference is likely technical. No retroperitoneal hemorrhage. Stomach and small bowel are unremarkable. Colon is unremarkable. Left lower quadrant ostomy is unremarkable. No acute inflammatory changes are evident in the abdomen or pelvis. There is no ascites. There is no adenopathy. There is no significant abnormality in the lower chest. There is no significant skeletal lesion. IMPRESSION: 1. Urinary bladder appears to  be collapsed around the suprapubic catheter. 2. 3 mm left ureteral calculus just below the level of the iliac vasculature. Mild hydronephrosis on the left. 3. 9 mm calculus in the urinary bladder at the level of the right ureterovesical junction. Mild right hydronephrosis. 4. At least 3 additional urinary bladder calculi, measuring up to 1.9 cm. There also is a 3.4 cm calculus in the vesicle rectal fistula. 5. Cholelithiasis. 6. Unchanged 5.9 cm infrarenal abdominal aortic aneurysm. Electronically Signed   By: Andreas Newport M.D.   On: 10/09/2015 22:33    I independently reviewed the above imaging studies.  Impression/Recommendation 1) Bilateral hydronephrosis: Pt appears to have a left ureteral stone as possible cause of left hydronephrosis (new since CT last year) with unclear etiology of mild right hydronephrosis.  Pt is afebrile without signs of sepsis.  Urine is colonized as expected with history of chronic indwelling SP tube and history or rectourethral fistula.  Cr is 1.35 and SP tube now draining well. In absence of systemic signs of infection, I have not recommended any acute intervention.  If he did require ureteral drainage, this would be difficult considering he could not safely undergo percutaneous nephrostomy drainage on anticoagulation.  However, anesthetic risks and the likely technical difficulty (history of extensive urethral stricture and presence of fistula) of placing retrograde ureteral stent are great as well.  Therefore, I would recommend holding Eliquis currently in case he may need a procedure. This will provide more options if he has a more urgent need for any intervention.  2) Left ureteral stone: This can currently be managed expectantly in absence of systemic signs of infection and manageable pain.  He hopefully can pass this 3 mm stone.  He and his wife are hesitant to even proceed with any elective treatment option considering the risk and his comorbidities. They will need  to discuss treatment goals with Dr. Risa Grill.  3) Rectourethral fistula: Continue SP tube drainage.  Currently, SP tube is draining well and hopefully should adequately drain bladder to help decreased drainage from rectum which hopefully will help ulcers to heal more easily.  4) Bladder calculi: Do not require acute intervention.  Even elective treatment would carry significant risks for this patient and will need to be discussed between patient and Dr. Risa Grill.  Oluwakemi Salsberry,LES 10/10/2015, 1:47 AM    Pryor Curia MD  CC: Dr. Blaine Hamper Dr. Rana Snare

## 2015-10-10 NOTE — Progress Notes (Signed)
Subjective: Patient reports no significant pain at this time. He is awake alert and oriented 3. I appreciate Dr. Lynne Logan consultation. Imaging studies reviewed. Nursing staff tells me suprapubic tube has been draining well and there is not been any obvious drainage from his buttocks/rectal area.  Objective: Vital signs in last 24 hours: Temp:  [98.1 F (36.7 C)-99.7 F (37.6 C)] 99 F (37.2 C) (04/10 1301) Pulse Rate:  [41-78] 41 (04/10 1301) Resp:  [16-20] 16 (04/10 1301) BP: (96-118)/(31-86) 99/40 mmHg (04/10 1301) SpO2:  [93 %-95 %] 94 % (04/10 1301) Weight:  [97.387 kg (214 lb 11.2 oz)] 97.387 kg (214 lb 11.2 oz) (04/10 0048)  Intake/Output from previous day: 04/09 0701 - 04/10 0700 In: 2050 [I.V.:1800; IV Piggyback:250] Out: 725 [Urine:725] Intake/Output this shift: Total I/O In: -  Out: 400 [Urine:400]  Physical Exam:  Constitutional: Vital signs reviewed. WD WN in NAD   Eyes: PERRL, No scleral icterus.   Cardiovascular: RRR Pulmonary/Chest: Normal effort Abdominal: Soft. Non-tendert.  SP site okay  Genitourinary: Extremities: No cyanosis or edema   Lab Results:  Recent Labs  10/09/15 2232 10/10/15 0011  HGB 12.7* 13.2  HCT 38.3* 39.6   BMET  Recent Labs  10/09/15 2232 10/10/15 0011  NA 137 134*  K 3.2* 3.0*  CL 97* 97*  CO2 29 27  GLUCOSE 114* 114*  BUN 18 18  CREATININE 1.35* 1.37*  CALCIUM 8.9 8.6*    Recent Labs  10/10/15 0011  INR 1.40   No results for input(s): LABURIN in the last 72 hours. Results for orders placed or performed during the hospital encounter of 10/09/15  Culture, blood (Routine X 2) w Reflex to ID Panel     Status: None (Preliminary result)   Collection Time: 10/09/15 10:33 PM  Result Value Ref Range Status   Specimen Description BLOOD RIGHT ANTECUBITAL  Final   Special Requests   Final    BOTTLES DRAWN AEROBIC AND ANAEROBIC 10ML Performed at [facility]    Culture PENDING  Incomplete   Report Status  PENDING  Incomplete  Culture, blood (Routine X 2) w Reflex to ID Panel     Status: None (Preliminary result)   Collection Time: 10/09/15 10:48 PM  Result Value Ref Range Status   Specimen Description BLOOD LEFT ANTECUBITAL  Final   Special Requests   Final    BOTTLES DRAWN AEROBIC AND ANAEROBIC 5ML Performed at [facility]    Culture PENDING  Incomplete   Report Status PENDING  Incomplete    Studies/Results: Ct Renal Stone Study  10/09/2015  CLINICAL DATA:  Suprapubic catheter not draining. EXAM: CT ABDOMEN AND PELVIS WITHOUT CONTRAST TECHNIQUE: Multidetector CT imaging of the abdomen and pelvis was performed following the standard protocol without IV contrast. COMPARISON:  11/18/2014 FINDINGS: The suprapubic catheter extends into the urinary bladder. The bladder is collapsed around the catheter. There are multiple calculi in the bladder, new from 11/18/2014. These measure up to 1.9 cm. The calculus in the rectovesical fistula is again evident, measuring around 3.4 cm. There is mild hydronephrosis and hydroureter bilaterally. On the right there is a 9 mm calculus at the ureterovesical junction. On the left there is a 3 mm calculus of the ureter just below the iliac vasculature. There is cholelithiasis. There are unremarkable unenhanced appearances of the liver and bile ducts. There are unremarkable unenhanced appearances of the spleen, pancreas and adrenals. There is an infrarenal abdominal aortic aneurysm with maximum AP diameter 5.9 cm. It  measured 5.8 cm on 11/18/2014, and the difference is likely technical. No retroperitoneal hemorrhage. Stomach and small bowel are unremarkable. Colon is unremarkable. Left lower quadrant ostomy is unremarkable. No acute inflammatory changes are evident in the abdomen or pelvis. There is no ascites. There is no adenopathy. There is no significant abnormality in the lower chest. There is no significant skeletal lesion. IMPRESSION: 1. Urinary bladder appears  to be collapsed around the suprapubic catheter. 2. 3 mm left ureteral calculus just below the level of the iliac vasculature. Mild hydronephrosis on the left. 3. 9 mm calculus in the urinary bladder at the level of the right ureterovesical junction. Mild right hydronephrosis. 4. At least 3 additional urinary bladder calculi, measuring up to 1.9 cm. There also is a 3.4 cm calculus in the vesicle rectal fistula. 5. Cholelithiasis. 6. Unchanged 5.9 cm infrarenal abdominal aortic aneurysm. Electronically Signed   By: Andreas Newport M.D.   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 He has bladder calculi and questionable bilateral ureteral stones. There is a 3 mm left ureteral stone that should have a extremely high likelihood of passing spontaneously. There is a 9 mm stone near the right ureteral orifice which may be obstructing or could potentially be in the bladder. He does have some mild bilateral hydronephrosis but that also could be present secondary to high pressure bladder and general bladder thickening. It somewhat unclear how much obstruction he currently is experiencing. Renal function has declined but again it is not severe at this time and has been stable. I have recommended at this point ongoing observation. If his creatinine continues to increase, he develops any right-sided flank discomfort or develops signs or symptoms urosepsis he will require percutaneous nephrostomy tube on the right and potentially bilaterally. He will be impossible technically to place ureteral stents from below given his current urinary tract.  He should remain off his anticoagulation in case a percutaneous nephrostomy tube is necessary. Long-term prognosis for any good quality of life is really quite low. I would suggest at least a palliative consult to discuss that as an option going forward. His "urinary tract infection"  can be treated but given his chronic suprapubic tube and rectourethral fistula you are never going to get sterile urine in this situation and ongoing antibiotic use will certainly lead to further resistance problems. We'll plan on probable repeat CT on Wednesday to assess the status of the stones. Patient's nothing by mouth can be discontinued and he can start a diet.   LOS: 1 day   Jackolyn Geron S 10/10/2015, 5:29 PM

## 2015-10-11 DIAGNOSIS — I5032 Chronic diastolic (congestive) heart failure: Secondary | ICD-10-CM

## 2015-10-11 LAB — GLUCOSE, CAPILLARY: GLUCOSE-CAPILLARY: 132 mg/dL — AB (ref 65–99)

## 2015-10-11 NOTE — Progress Notes (Signed)
Pt's wife insisted his colostomy bag and wafer be changed.  There appeared to be no leaking, it did have stain of some type on the wafer area.  Wife took it off only to discover that we do not have the exact kind he uses.  She states she did not bring any since she did not realize they were staying.  I got a one piece colostomy bag/wafer  from materials management and pt's wife insisted on cutting the hole for the stoma.  She cut it way too large.  Skin prep used to protect exposed area.  Fara Olden P

## 2015-10-11 NOTE — Progress Notes (Signed)
[facility] PROGRESS NOTE    Progress Note   Zachary Haas [id number] DOB: [birth year] DOA: 10-14-15 PCP: Henrine Screws, MD   Brief Narrative:   Zachary Haas is an 82 y.o. male past medical history of bladder cancer status post suprapubic catheter placement without rectourethral fistula, dedicated CT renal protocol showed a 3 mm left ureteral stone with mild hydronephrosis on the left  Assessment/Plan:   Sepsis due to Complicated UTI (lower urinary tract infection) Continue empiric IV Zosyn, blood cultures and urine cultures are pending. Vitals are stable. Continue IV fluids. Once sensitivities back from culture can Deescalated treatment. Leukocytosis resolved.  Bilateral hydronephrosis left ureteral stone: Left ureteral stones. Allow her diet appreciate urology's assistance. Urology recommended to continue to observe. If stone has not moved longer recommended bilateral nephrostomy tubes.  Acute on chronic kidney failure-II: Likely due to obstructive uropathy, awaiting urology recommendations.  Hyperlipidemia: Continue Zocor.  Dementia: Continued home regimen.  Essential hypertension: Continue to hold Lasix, heart rate rate control continue metoprolol.  Chronic diastolic heart failure: Lasix has been held., He seems to be euvolemic, continue metoprolol, he has had a mild elevation in his BNP is likely reactive the patient doesn't have signs of fluid overload.  Hypokalemia Replete orally.  Atrial fibrillation, persistent ([place]) Hold Eluquis rate controlled continue metoprolol.  Sacral Decubitus ulcer: That wound care consult.  DVT Prophylaxis SCD's.  Family Communication: wife Disposition Plan: Home 2-3 days Code Status:     Code Status Orders        Start     Ordered   14-Oct-2015 2346  Do not attempt resuscitation (DNR)   Continuous    Question Answer Comment  In the event of cardiac or respiratory ARREST Do not call a "code blue"   In  the event of cardiac or respiratory ARREST Do not perform Intubation, CPR, defibrillation or ACLS   In the event of cardiac or respiratory ARREST Use medication by any route, position, wound care, and other measures to relive pain and suffering. May use oxygen, suction and manual treatment of airway obstruction as needed for comfort.      October 14, 2015 2346    Code Status History    Date Active Date Inactive Code Status Order ID Comments User Context   11/25/2012  8:26 PM 12/01/2012  3:13 PM Full Code [id number]  Sorin June Leap, MD Inpatient   08/04/2012  4:28 PM 08/09/2012  3:53 PM Full Code [id number]  Diamantina Monks, RN Inpatient    Advance Directive Documentation        Most Recent Value   Type of Advance Directive  Living will   Pre-existing out of facility DNR order (yellow form or pink MOST form)     "MOST" Form in Place?          IV Access:    Peripheral IV   Procedures and diagnostic studies:   Ct Renal Stone Study  10/14/15  CLINICAL DATA:  Suprapubic catheter not draining. EXAM: CT ABDOMEN AND PELVIS WITHOUT CONTRAST TECHNIQUE: Multidetector CT imaging of the abdomen and pelvis was performed following the standard protocol without IV contrast. COMPARISON:  11/18/2014 FINDINGS: The suprapubic catheter extends into the urinary bladder. The bladder is collapsed around the catheter. There are multiple calculi in the bladder, new from 11/18/2014. These measure up to 1.9 cm. The calculus in the rectovesical fistula is again evident, measuring around 3.4 cm. There is mild hydronephrosis and hydroureter bilaterally. On the right there is a 9 mm  calculus at the ureterovesical junction. On the left there is a 3 mm calculus of the ureter just below the iliac vasculature. There is cholelithiasis. There are unremarkable unenhanced appearances of the liver and bile ducts. There are unremarkable unenhanced appearances of the spleen, pancreas and adrenals. There is an infrarenal abdominal aortic aneurysm  with maximum AP diameter 5.9 cm. It measured 5.8 cm on 11/18/2014, and the difference is likely technical. No retroperitoneal hemorrhage. Stomach and small bowel are unremarkable. Colon is unremarkable. Left lower quadrant ostomy is unremarkable. No acute inflammatory changes are evident in the abdomen or pelvis. There is no ascites. There is no adenopathy. There is no significant abnormality in the lower chest. There is no significant skeletal lesion. IMPRESSION: 1. Urinary bladder appears to be collapsed around the suprapubic catheter. 2. 3 mm left ureteral calculus just below the level of the iliac vasculature. Mild hydronephrosis on the left. 3. 9 mm calculus in the urinary bladder at the level of the right ureterovesical junction. Mild right hydronephrosis. 4. At least 3 additional urinary bladder calculi, measuring up to 1.9 cm. There also is a 3.4 cm calculus in the vesicle rectal fistula. 5. Cholelithiasis. 6. Unchanged 5.9 cm infrarenal abdominal aortic aneurysm. Electronically Signed   By: Andreas Newport M.D.   On: 10/09/2015 22:33     Medical Consultants:    None.  Anti-Infectives:   Anti-infectives    Start     Dose/Rate Route Frequency Ordered Stop   10/10/15 0100  piperacillin-tazobactam (ZOSYN) IVPB 3.375 g     3.375 g 12.5 mL/hr over 240 Minutes Intravenous Every 8 hours 10/10/15 0048     10/10/15 0015  piperacillin-tazobactam (ZOSYN) IVPB 3.375 g  Status:  Discontinued     3.375 g 12.5 mL/hr over 240 Minutes Intravenous 3 times per day 10/10/15 0009 10/10/15 0048   10/10/15 0000  trimethoprim (TRIMPEX) tablet 100 mg     100 mg Oral Every evening 10/09/15 2343     10/09/15 2315  cefTRIAXone (ROCEPHIN) 1 g in dextrose 5 % 50 mL IVPB  Status:  Discontinued     1 g 100 mL/hr over 30 Minutes Intravenous Every 24 hours 10/09/15 2301 10/10/15 0009      Subjective:    Zachary Haas No new complaints.  Objective:    Filed Vitals:   10/10/15 0944 10/10/15 1301 10/10/15  2115 10/11/15 0522  BP:  99/40 114/41 113/41  Pulse: 70 41 52 70  Temp:  99 F (37.2 C) 98.1 F (36.7 C) 98.6 F (37 C)  TempSrc:  Oral Oral Oral  Resp:  16 16 16   Height:      Weight:    97.977 kg (216 lb)  SpO2:  94% 94% 95%    Intake/Output Summary (Last 24 hours) at 10/11/15 0706 Last data filed at 10/11/15 0600  Gross per 24 hour  Intake   1146 ml  Output   1500 ml  Net   -354 ml   Filed Weights   10/10/15 0048 10/11/15 0522  Weight: 97.387 kg (214 lb 11.2 oz) 97.977 kg (216 lb)    Exam: Gen:  NAD Cardiovascular:  RRR. Chest and lungs:   CTAB Abdomen:  Abdomen soft, NT/ND, + BS Extremities:  No edema   Data Reviewed:    Labs: Basic Metabolic Panel:  Recent Labs Lab 10/09/15 2232 10/10/15 0011  NA 137 134*  K 3.2* 3.0*  CL 97* 97*  CO2 29 27  GLUCOSE 114* 114*  BUN  18 18  CREATININE 1.35* 1.37*  CALCIUM 8.9 8.6*  MG  --  1.9   GFR Estimated Creatinine Clearance: 44.7 mL/min (by C-G formula based on Cr of 1.37). Liver Function Tests: No results for input(s): AST, ALT, ALKPHOS, BILITOT, PROT, ALBUMIN in the last 168 hours. No results for input(s): LIPASE, AMYLASE in the last 168 hours. No results for input(s): AMMONIA in the last 168 hours. Coagulation profile  Recent Labs Lab 10/10/15 0011  INR 1.40    CBC:  Recent Labs Lab 10/09/15 2232 10/10/15 0011  WBC 11.2* 10.5  NEUTROABS 7.8*  --   HGB 12.7* 13.2  HCT 38.3* 39.6  MCV 94.6 96.6  PLT 206 184   Cardiac Enzymes: No results for input(s): CKTOTAL, CKMB, CKMBINDEX, TROPONINI in the last 168 hours. BNP (last 3 results) No results for input(s): PROBNP in the last 8760 hours. CBG: No results for input(s): GLUCAP in the last 168 hours. D-Dimer: No results for input(s): DDIMER in the last 72 hours. Hgb A1c: No results for input(s): HGBA1C in the last 72 hours. Lipid Profile: No results for input(s): CHOL, HDL, LDLCALC, TRIG, CHOLHDL, LDLDIRECT in the last 72 hours. Thyroid  function studies: No results for input(s): TSH, T4TOTAL, T3FREE, THYROIDAB in the last 72 hours.  Invalid input(s): FREET3 Anemia work up: No results for input(s): VITAMINB12, FOLATE, FERRITIN, TIBC, IRON, RETICCTPCT in the last 72 hours. Sepsis Labs:  Recent Labs Lab 10/09/15 2232 10/09/15 2243 10/10/15 0011  PROCALCITON  --   --  <0.10  WBC 11.2*  --  10.5  LATICACIDVEN  --  2.23* 1.7   Microbiology Recent Results (from the past 240 hour(s))  Culture, blood (Routine X 2) w Reflex to ID Panel     Status: None (Preliminary result)   Collection Time: 10/09/15 10:33 PM  Result Value Ref Range Status   Specimen Description BLOOD RIGHT ANTECUBITAL  Final   Special Requests   Final    BOTTLES DRAWN AEROBIC AND ANAEROBIC 10ML Performed at [facility]    Culture PENDING  Incomplete   Report Status PENDING  Incomplete  Culture, blood (Routine X 2) w Reflex to ID Panel     Status: None (Preliminary result)   Collection Time: 10/09/15 10:48 PM  Result Value Ref Range Status   Specimen Description BLOOD LEFT ANTECUBITAL  Final   Special Requests   Final    BOTTLES DRAWN AEROBIC AND ANAEROBIC 5ML Performed at [facility]    Culture PENDING  Incomplete   Report Status PENDING  Incomplete     Medications:   . buPROPion  100 mg Oral BID  . cholecalciferol  5,000 Units Oral Daily  . citalopram  40 mg Oral Daily  . dextromethorphan-guaiFENesin  1 tablet Oral Q12H  . digoxin  0.125 mg Oral Daily  . docusate sodium  300 mg Oral QHS  . donepezil  10 mg Oral Daily  . memantine  10 mg Oral BID  . metoprolol tartrate  12.5 mg Oral BID  . multivitamin with minerals  1 tablet Oral Daily  . piperacillin-tazobactam (ZOSYN)  IV  3.375 g Intravenous Q8H  . potassium chloride SA  20 mEq Oral BID  . simvastatin  40 mg Oral QHS  . sodium chloride flush  3 mL Intravenous Q12H  . trimethoprim  100 mg Oral QPM   Continuous Infusions: . sodium chloride 10 mL/hr (10/10/15  1034)    Time spent: 25 mins.   LOS: 2 days  Charlynne Cousins  [facility] Pager [phone]  *Please refer to [email or web address], password [email or web address] to get updated schedule on who will round on this patient, as hospitalists switch teams weekly. If 7PM-7AM, please contact night-coverage at [email or web address], password [email or web address] for any overnight needs.  10/11/2015, 7:06 AM

## 2015-10-11 NOTE — Progress Notes (Signed)
Nutrition Brief Note  Patient identified on the [place] Report  Wt Readings from Last 15 Encounters:  10/11/15 216 lb (97.977 kg)  02/16/15 216 lb 12.8 oz (98.34 kg)  01/11/15 225 lb 9.6 oz (102.331 kg)  11/23/14 228 lb (103.42 kg)  10/08/14 212 lb (96.163 kg)  12/18/12 222 lb 9.6 oz (100.971 kg)  11/27/12 228 lb 6.3 oz (103.6 kg)  10/31/12 228 lb 9.6 oz (103.692 kg)  10/20/12 233 lb (105.688 kg)  09/11/12 233 lb (105.688 kg)  08/19/12 236 lb (107.049 kg)  08/04/12 238 lb (107.956 kg)  07/25/12 238 lb 9.6 oz (108.228 kg)  07/18/12 238 lb 6.4 oz (108.138 kg)   Spoke with family, patient at bedside briefly. He/They report: No loss of appetite. No weight loss. No chewing/swallow issues.  Pt does not consume ensure or boost -> drinks whole milk daily -> continuing to consume.  I did identify mild-moderate muscle depletion at orbitals and severe scooping a temples but no other concerns.  Body mass index is 32.85 kg/(m^2). Patient meets criteria for obese based on current BMI.   Current diet order is regular, patient is consuming approximately 75% of meals at this time. Labs and medications reviewed.   No nutrition interventions warranted at this time. If nutrition issues arise, please consult RD.   Satira Anis. Arjay Jaskiewicz, MS, RD LDN After Hours/Weekend Pager [phone]

## 2015-10-12 ENCOUNTER — Inpatient Hospital Stay (HOSPITAL_COMMUNITY): Payer: Medicare Other

## 2015-10-12 LAB — BASIC METABOLIC PANEL
Anion gap: 9 (ref 5–15)
BUN: 13 mg/dL (ref 6–20)
CHLORIDE: 109 mmol/L (ref 101–111)
CO2: 23 mmol/L (ref 22–32)
CREATININE: 1.29 mg/dL — AB (ref 0.61–1.24)
Calcium: 8.8 mg/dL — ABNORMAL LOW (ref 8.9–10.3)
GFR calc non Af Amer: 49 mL/min — ABNORMAL LOW (ref >60)
GFR, EST AFRICAN AMERICAN: 57 mL/min — AB (ref >60)
GLUCOSE: 148 mg/dL — AB (ref 65–99)
POTASSIUM: 3.9 mmol/L (ref 3.5–5.1)
SODIUM: 141 mmol/L (ref 135–145)

## 2015-10-12 LAB — GLUCOSE, CAPILLARY: GLUCOSE-CAPILLARY: 131 mg/dL — AB (ref 65–99)

## 2015-10-12 MED ORDER — FUROSEMIDE 40 MG PO TABS
80.0000 mg | ORAL_TABLET | Freq: Two times a day (BID) | ORAL | Status: DC
  Administered 2015-10-12 – 2015-10-13 (×3): 80 mg via ORAL
  Filled 2015-10-12 (×3): qty 2

## 2015-10-12 NOTE — Care Management Important Message (Signed)
Important Message  Patient Details  Name: Zachary Haas MRN: [id number] Date of Birth: 02-11-1928   Medicare Important Message Given:  Yes    Camillo Flaming 10/12/2015, 10:23 AMImportant Message  Patient Details  Name: Zachary Haas MRN: [id number] Date of Birth: 1928-01-15   Medicare Important Message Given:  Yes    Camillo Flaming 10/12/2015, 10:23 AM

## 2015-10-12 NOTE — Progress Notes (Signed)
PT Cancellation Note  Patient Details Name: Zachary Haas MRN: [id number] DOB: February 15, 1928   Cancelled Treatment:    Reason Eval/Treat Not Completed: PT screened, no needs identified, will sign off (checked with family, no needs at this time.)   Claretha Cooper 10/12/2015, 4:38 PM Tresa Endo PT [phone]

## 2015-10-12 NOTE — Progress Notes (Signed)
Subjective: Patient reports no significant change. Zachary Haas remains in relatively good spirits and has no new complaints. His wife and daughter have no new concerns. They report that he is remained relatively dry and his suprapubic tube appears to be draining well. He has had no flank pain. He is remained afebrile. Renal ultrasound today shows resolution of his right hydronephrosis with what appears to be mild left hydronephrosis.  Objective: Vital signs in last 24 hours: Temp:  [97.4 F (36.3 C)-98 F (36.7 C)] 97.4 F (36.3 C) (04/12 0643) Pulse Rate:  [56-62] 62 (04/12 0643) Resp:  [18] 18 (04/12 0643) BP: (112-132)/(47-60) 131/48 mmHg (04/12 0643) SpO2:  [91 %-95 %] 94 % (04/12 0643) Weight:  [99.837 kg (220 lb 1.6 oz)] 99.837 kg (220 lb 1.6 oz) (04/12 0643)  Intake/Output from previous day: 04/11 0701 - 04/12 0700 In: 600 [I.V.:550; IV Piggyback:50] Out: [id number] [Urine:1150] Intake/Output this shift: Total I/O In: 50 [IV Piggyback:50] Out: -   Physical Exam:  Constitutional: Vital signs reviewed. WD WN in NAD   Eyes: PERRL, No scleral icterus.   Cardiovascular: RRR Pulmonary/Chest: Normal effort Abdominal: Soft. Non-tender, Genitourinary: SP tube draining Extremities: No cyanosis or edema   Lab Results:  Recent Labs  10/09/15 2232 10/10/15 0011  HGB 12.7* 13.2  HCT 38.3* 39.6   BMET  Recent Labs  10/10/15 0011 10/12/15 0853  NA 134* 141  K 3.0* 3.9  CL 97* 109  CO2 27 23  GLUCOSE 114* 148*  BUN 18 13  CREATININE 1.37* 1.29*  CALCIUM 8.6* 8.8*    Recent Labs  10/10/15 0011  INR 1.40   No results for input(s): LABURIN in the last 72 hours. Results for orders placed or performed during the hospital encounter of 10/09/15  Urine culture     Status: Abnormal (Preliminary result)   Collection Time: 10/09/15  9:58 PM  Result Value Ref Range Status   Specimen Description URINE, CATHETERIZED  Final   Special Requests NONE  Final   Culture (A)  Final   >=100,000 COLONIES/mL PSEUDOMONAS AERUGINOSA 50,000 COLONIES/mL GRAM NEGATIVE RODS    Report Status PENDING  Incomplete  Culture, blood (Routine X 2) w Reflex to ID Panel     Status: None (Preliminary result)   Collection Time: 10/09/15 10:33 PM  Result Value Ref Range Status   Specimen Description BLOOD RIGHT ANTECUBITAL  Final   Special Requests BOTTLES DRAWN AEROBIC AND ANAEROBIC 10ML  Final   Culture   Final    NO GROWTH 1 DAY Performed at [facility]    Report Status PENDING  Incomplete  Culture, blood (Routine X 2) w Reflex to ID Panel     Status: None (Preliminary result)   Collection Time: 10/09/15 10:48 PM  Result Value Ref Range Status   Specimen Description BLOOD LEFT ANTECUBITAL  Final   Special Requests BOTTLES DRAWN AEROBIC AND ANAEROBIC 5ML  Final   Culture   Final    NO GROWTH 1 DAY Performed at [facility]    Report Status PENDING  Incomplete    Studies/Results: US Renal  10/12/2015  CLINICAL DATA:  Hydronephrosis EXAM: RENAL / URINARY TRACT ULTRASOUND COMPLETE COMPARISON:  10/09/2015 FINDINGS: Right Kidney: Length: 11.8 cm. Echogenicity within normal limits. No mass or hydronephrosis visualized. Left Kidney: Length: 11.1 cm.  Mild fullness of the collecting system is noted Bladder: Decompressed by a Foley catheter IMPRESSION: Mild fullness of the left collecting system. This is stable from the recent CT examination. The  prominence seen on the right on the prior exam has resolved in the interval. Electronically Signed   By: Inez Catalina M.D.   On: 10/12/2015 11:35    Assessment/Plan:   Zachary Haas will undoubtedly have multiple organisms that will probably be fairly resistant within his urine. It's unclear house clinically significant this infection is. He has remained quite stable without obvious urosepsis. Ongoing antibiotics are unlikely to be of any major benefit since he will almost immediately recolonize given his anatomy and chronic suprapubic tube.  If he has a right ureteral stone and there are certainly no evidence of obstruction based on the recent ultrasound. It's quite possible that his initial bilateral hydronephrosis was more related to suprapubic dysfunction and obstruction rather than the stone. It remains somewhat unclear whether he has a right ureteral stone or not but certainly no evidence of obstruction. There is mild dilation of the left collecting system and he does have a known small stone that may or may not of past at this point. I do not see strong indication for continued hospitalization. We certainly can follow up with Bill as an outpatient. He'll ultimately need a another stone protocol CT in several weeks or sooner if his clinical situation deteriorates. His overall renal function has improved somewhat. The hydronephrosis appears to be resolving and again there are probably more downsides to longer term broad-spectrum antibiotics and positives at this point.   LOS: 3 days   Riven Mabile S 10/12/2015, 12:55 PM

## 2015-10-12 NOTE — Progress Notes (Signed)
[facility] PROGRESS NOTE    Progress Note   Dennise Duffin Marando [id number] DOB: 1928/04/19 DOA: 10/09/2015 PCP: Henrine Screws, MD   Brief Narrative:   Zachary Haas is an 82 y.o. male past medical history of bladder cancer status post suprapubic catheter placement without rectourethral fistula, dedicated CT renal protocol showed a 3 mm left ureteral stone with mild hydronephrosis on the left  Assessment/Plan:   Sepsis due to Complicated UTI (lower urinary tract infection) Continue empiric IV Zosyn, blood cultures are negative, urine cultures grew more than 100,000 gram-negative rods Once sensitivities back from culture can Deescalated treatment.  Bilateral hydronephrosis left ureteral stone: Left ureteral stones. Appreciate urology's assistance. Urology recommended to continue to observe.  Repeat renal ultrasound. If stone has not Urology moved longer recommended bilateral nephrostomy tubes.  Acute on chronic kidney failure-II: Renal function has remained stable, repeat a basic metabolic panel. His weight has started to increase we'll start him on his home dose of Lasix.  Hyperlipidemia: Continue Zocor.  Dementia: Continued home regimen.  Essential hypertension: Resume Lasix blood pressure trending high, heart rate control continue metoprolol.  Chronic diastolic heart failure: Weight has started to increase, we will start him on his home dose of Lasix DC IV fluids.  Hypokalemia Replete orally.  Atrial fibrillation, persistent (HCC) Cont to hold Eluquis rate controlled continue metoprolol.  Sacral Decubitus ulcer: That wound care consult.  DVT Prophylaxis SCD's.  Family Communication: wife Disposition Plan: Home 2 days Code Status:     Code Status Orders        Start     Ordered   10/09/15 2346  Do not attempt resuscitation (DNR)   Continuous    Question Answer Comment  In the event of cardiac or respiratory ARREST Do not call a "code blue"    In the event of cardiac or respiratory ARREST Do not perform Intubation, CPR, defibrillation or ACLS   In the event of cardiac or respiratory ARREST Use medication by any route, position, wound care, and other measures to relive pain and suffering. May use oxygen, suction and manual treatment of airway obstruction as needed for comfort.      10/09/15 2346    Code Status History    Date Active Date Inactive Code Status Order ID Comments User Context   11/25/2012  8:26 PM 12/01/2012  3:13 PM Full Code [id number]  Sorin June Leap, MD Inpatient   08/04/2012  4:28 PM 08/09/2012  3:53 PM Full Code [id number]  Diamantina Monks, RN Inpatient    Advance Directive Documentation        Most Recent Value   Type of Advance Directive  Living will   Pre-existing out of facility DNR order (yellow form or pink MOST form)     "MOST" Form in Place?          IV Access:    Peripheral IV   Procedures and diagnostic studies:   No results found.   Medical Consultants:    None.  Anti-Infectives:   Anti-infectives    Start     Dose/Rate Route Frequency Ordered Stop   10/10/15 0100  piperacillin-tazobactam (ZOSYN) IVPB 3.375 g     3.375 g 12.5 mL/hr over 240 Minutes Intravenous Every 8 hours 10/10/15 0048     10/10/15 0015  piperacillin-tazobactam (ZOSYN) IVPB 3.375 g  Status:  Discontinued     3.375 g 12.5 mL/hr over 240 Minutes Intravenous 3 times per day 10/10/15 0009 10/10/15 0048   10/10/15 0000  trimethoprim (TRIMPEX) tablet 100 mg     100 mg Oral Every evening 10/09/15 2343     10/09/15 2315  cefTRIAXone (ROCEPHIN) 1 g in dextrose 5 % 50 mL IVPB  Status:  Discontinued     1 g 100 mL/hr over 30 Minutes Intravenous Every 24 hours 10/09/15 2301 10/10/15 0009      Subjective:    Zachary Haas No new complaints.  Objective:    Filed Vitals:   10/11/15 1400 10/11/15 2110 10/11/15 2117 10/12/15 0643  BP: 112/60 132/47 132/47 131/48  Pulse: 58 56 56 62  Temp: 98 F (36.7 C)  97.7 F (36.5  C) 97.4 F (36.3 C)  TempSrc: Oral  Oral Oral  Resp: 18  18 18   Height:      Weight:    99.837 kg (220 lb 1.6 oz)  SpO2: 95%  91% 94%    Intake/Output Summary (Last 24 hours) at 10/12/15 0734 Last data filed at 10/12/15 0600  Gross per 24 hour  Intake    600 ml  Output    650 ml  Net    -50 ml   Filed Weights   10/10/15 0048 10/11/15 0522 10/12/15 0643  Weight: 97.387 kg (214 lb 11.2 oz) 97.977 kg (216 lb) 99.837 kg (220 lb 1.6 oz)    Exam: Gen:  NAD Cardiovascular:  RRR. Chest and lungs:   CTAB Abdomen:  Abdomen soft, NT/ND, + BS Extremities:  No edema   Data Reviewed:    Labs: Basic Metabolic Panel:  Recent Labs Lab 10/09/15 2232 10/10/15 0011  NA 137 134*  K 3.2* 3.0*  CL 97* 97*  CO2 29 27  GLUCOSE 114* 114*  BUN 18 18  CREATININE 1.35* 1.37*  CALCIUM 8.9 8.6*  MG  --  1.9   GFR Estimated Creatinine Clearance: 45.2 mL/min (by C-G formula based on Cr of 1.37). Liver Function Tests: No results for input(s): AST, ALT, ALKPHOS, BILITOT, PROT, ALBUMIN in the last 168 hours. No results for input(s): LIPASE, AMYLASE in the last 168 hours. No results for input(s): AMMONIA in the last 168 hours. Coagulation profile  Recent Labs Lab 10/10/15 0011  INR 1.40    CBC:  Recent Labs Lab 10/09/15 2232 10/10/15 0011  WBC 11.2* 10.5  NEUTROABS 7.8*  --   HGB 12.7* 13.2  HCT 38.3* 39.6  MCV 94.6 96.6  PLT 206 184   Cardiac Enzymes: No results for input(s): CKTOTAL, CKMB, CKMBINDEX, TROPONINI in the last 168 hours. BNP (last 3 results) No results for input(s): PROBNP in the last 8760 hours. CBG:  Recent Labs Lab 10/11/15 0749  GLUCAP 132*   D-Dimer: No results for input(s): DDIMER in the last 72 hours. Hgb A1c: No results for input(s): HGBA1C in the last 72 hours. Lipid Profile: No results for input(s): CHOL, HDL, LDLCALC, TRIG, CHOLHDL, LDLDIRECT in the last 72 hours. Thyroid function studies: No results for input(s): TSH, T4TOTAL,  T3FREE, THYROIDAB in the last 72 hours.  Invalid input(s): FREET3 Anemia work up: No results for input(s): VITAMINB12, FOLATE, FERRITIN, TIBC, IRON, RETICCTPCT in the last 72 hours. Sepsis Labs:  Recent Labs Lab 10/09/15 2232 10/09/15 2243 10/10/15 0011  PROCALCITON  --   --  <0.10  WBC 11.2*  --  10.5  LATICACIDVEN  --  2.23* 1.7   Microbiology Recent Results (from the past 240 hour(s))  Urine culture     Status: Abnormal (Preliminary result)   Collection Time: 10/09/15  9:58 PM  Result Value Ref Range Status   Specimen Description URINE, CATHETERIZED  Final   Special Requests NONE  Final   Culture (A)  Final    >=100,000 COLONIES/mL GRAM NEGATIVE RODS CULTURE REINCUBATED FOR BETTER GROWTH Performed at [facility]    Report Status PENDING  Incomplete  Culture, blood (Routine X 2) w Reflex to ID Panel     Status: None (Preliminary result)   Collection Time: 10/09/15 10:33 PM  Result Value Ref Range Status   Specimen Description BLOOD RIGHT ANTECUBITAL  Final   Special Requests BOTTLES DRAWN AEROBIC AND ANAEROBIC 10ML  Final   Culture   Final    NO GROWTH 1 DAY Performed at [facility]    Report Status PENDING  Incomplete  Culture, blood (Routine X 2) w Reflex to ID Panel     Status: None (Preliminary result)   Collection Time: 10/09/15 10:48 PM  Result Value Ref Range Status   Specimen Description BLOOD LEFT ANTECUBITAL  Final   Special Requests BOTTLES DRAWN AEROBIC AND ANAEROBIC 5ML  Final   Culture   Final    NO GROWTH 1 DAY Performed at [facility]    Report Status PENDING  Incomplete     Medications:   . buPROPion  100 mg Oral BID  . cholecalciferol  5,000 Units Oral Daily  . citalopram  40 mg Oral Daily  . dextromethorphan-guaiFENesin  1 tablet Oral Q12H  . digoxin  0.125 mg Oral Daily  . docusate sodium  300 mg Oral QHS  . donepezil  10 mg Oral Daily  . memantine  10 mg Oral BID  . metoprolol tartrate  12.5 mg Oral BID  .  multivitamin with minerals  1 tablet Oral Daily  . piperacillin-tazobactam (ZOSYN)  IV  3.375 g Intravenous Q8H  . potassium chloride SA  20 mEq Oral BID  . simvastatin  40 mg Oral QHS  . sodium chloride flush  3 mL Intravenous Q12H  . trimethoprim  100 mg Oral QPM   Continuous Infusions: . sodium chloride 50 mL/hr (10/11/15 [id number])    Time spent: 25 mins.   LOS: 3 days   Charlynne Cousins  [facility] Pager [phone]  *Please refer to [email or web address], password [email or web address] to get updated schedule on who will round on this patient, as hospitalists switch teams weekly. If 7PM-7AM, please contact night-coverage at [email or web address], password [email or web address] for any overnight needs.  10/12/2015, 7:34 AM

## 2015-10-13 LAB — BASIC METABOLIC PANEL
ANION GAP: 11 (ref 5–15)
BUN: 11 mg/dL (ref 6–20)
CALCIUM: 8.7 mg/dL — AB (ref 8.9–10.3)
CO2: 26 mmol/L (ref 22–32)
Chloride: 106 mmol/L (ref 101–111)
Creatinine, Ser: 1.23 mg/dL (ref 0.61–1.24)
GFR, EST AFRICAN AMERICAN: 60 mL/min — AB (ref >60)
GFR, EST NON AFRICAN AMERICAN: 52 mL/min — AB (ref >60)
Glucose, Bld: 119 mg/dL — ABNORMAL HIGH (ref 65–99)
POTASSIUM: 3.5 mmol/L (ref 3.5–5.1)
Sodium: 143 mmol/L (ref 135–145)

## 2015-10-13 LAB — GLUCOSE, CAPILLARY: GLUCOSE-CAPILLARY: 119 mg/dL — AB (ref 65–99)

## 2015-10-13 MED ORDER — FUROSEMIDE 10 MG/ML IJ SOLN
40.0000 mg | Freq: Once | INTRAMUSCULAR | Status: AC
  Administered 2015-10-13: 40 mg via INTRAVENOUS
  Filled 2015-10-13: qty 4

## 2015-10-13 NOTE — Discharge Summary (Signed)
Physician Discharge Summary  Zachary Haas [id number] DOB: 26-Aug-1927 DOA: 10/09/2015  PCP: Henrine Screws, MD  Admit date: 10/09/2015 Discharge date: 10/13/2015  Time spent: 35 minutes  Recommendations for Outpatient Follow-up:  1. Follow-up with urology in 2-4 weeks with a repeat dedicated CT scan of the kidneys   Discharge Diagnoses:  Principal Problem:   UTI (lower urinary tract infection) Active Problems:   Rectourethral fistula   GERD (gastroesophageal reflux disease)   Pure hypercholesterolemia   Hypertension   Right bundle branch block (RBBB) with left anterior hemiblock   S/P CABG x 4   Chronic diastolic HF (heart failure), NYHA class 3 (HCC)   Hypokalemia   Atrial fibrillation, persistent (HCC)   Sepsis due to urinary tract infection ([place])   Bilateral hydronephrosis   Acute on chronic kidney failure-II   Decubitus ulcer   Discharge Condition: guarded  Diet recommendation: heart healthy  Filed Weights   10/11/15 0522 10/12/15 0643 10/13/15 0122  Weight: 97.977 kg (216 lb) 99.837 kg (220 lb 1.6 oz) 98.249 kg (216 lb 9.6 oz)    History of present illness:  82 year old with past mental history of bladder cancer, with a suprapubic catheter rectal urethral fistula that came in to the hospital as a suprapubic catheter was not draining as per wife, he didn't develop some abdominal spasms and the patient had a sacral decubitus ulcer.  Hospital Course:  Bilateral hydronephrosis with renal stones: CT of the abdomen was done that showed bilateral ureteral stones with bilateral hydronephrosis or concerning on the left than on the right, due to his mild leukocytosis and dirty urine he was started on Zosyn blood cultures were negative urine culture grew more than 100,000 colonies of Pseudomonas, which may be colonization. So antibiotics were DC'd, this was discussed with the urologist who agreed. He was started on IV hydration repeated ultrasound was done showed less  fullness of the left collecting system. Urology recommended to repeat a dedicated renal CT scan in 2 weeks as an outpatient. Physical therapy was consulted, but the wife refutes skilled nursing facility, he will go home with home health physical therapy and an aid.  Acute on chronic renal failure: Likely due to dehydration, his lactic acid was elevated which resolved with IV fluid hydration.  Hyperlipidemia: No changes made to his medication.  Dementia without behavioral disturbances: No changes were made to his regimen.  Essential hypertension: No changes were made to his regimen.  Chronic diastolic heart failure: His Lasix was held initially as he had mild renal failure and mild lactic acidosis. Once this was off his Lasix was started, no changes were made to his medication.  Chronic atrial fibrillation Chads score greater than 4. His Eluquis was held on admission, asked there was a concern that he would've needed percutaneous nephrostomy tubes. (Hydronephrosis started to improve urology recommended to continue to treat conservatively. His Eluquis was resolved.  Hyperkalemia: This was repleted and resolved.  Sacral decubitus ulcer stage I to 2: Wound care was consulted who recommended, barrier creams have frequent turning.  Procedures:  CT renal  Renal US   Consultations:  Urology  Discharge Exam: Filed Vitals:   10/12/15 2252 10/13/15 0614  BP: 122/49 113/49  Pulse: 58 53  Temp: 98.4 F (36.9 C) 98.5 F (36.9 C)  Resp: 18 20    General: A&O x3 Cardiovascular: RRR Respiratory: good air movement CTA B/L  Discharge Instructions   Discharge Instructions    Diet - low sodium heart healthy  Complete by:  As directed      Increase activity slowly    Complete by:  As directed           Current Discharge Medication List    CONTINUE these medications which have NOT CHANGED   Details  apixaban (ELIQUIS) 5 MG TABS tablet Take 1 tablet (5 mg total) by  mouth 2 (two) times daily. Qty: 20 tablet, Refills: 0    buPROPion (WELLBUTRIN) 100 MG tablet Take 100 mg by mouth 2 (two) times daily.     Cholecalciferol (VITAMIN D3) 50000 UNITS CAPS Take 5,000 Units by mouth daily.    citalopram (CELEXA) 40 MG tablet Take 40 mg by mouth daily. Refills: 2    dextromethorphan-guaiFENesin (MUCINEX DM) 30-600 MG per 12 hr tablet Take 1 tablet by mouth every 12 (twelve) hours.     digoxin (LANOXIN) 0.125 MG tablet Take 1 tablet (0.125 mg total) by mouth daily. Qty: 30 tablet, Refills: 5    docusate sodium (COLACE) 100 MG capsule Take 300 mg by mouth at bedtime.    donepezil (ARICEPT) 10 MG tablet Take 10 mg by mouth daily. Refills: 2    furosemide (LASIX) 80 MG tablet Take 1 tablet (80 mg total) by mouth 2 (two) times daily. Qty: 60 tablet, Refills: 2    memantine (NAMENDA) 10 MG tablet Take 10 mg by mouth 2 (two) times daily.    metoprolol tartrate (LOPRESSOR) 25 MG tablet Take 0.5 tablets (12.5 mg total) by mouth 2 (two) times daily.    Multiple Vitamins-Minerals (MULTIVITAMIN WITH MINERALS) tablet Take 1 tablet by mouth daily.    OVER THE COUNTER MEDICATION Take 5 g by mouth 2 (two) times daily. (fiber)    potassium chloride SA (K-DUR,KLOR-CON) 20 MEQ tablet Take 1 tablet (20 mEq total) by mouth 2 (two) times daily. Qty: 60 tablet, Refills: 5    simvastatin (ZOCOR) 40 MG tablet Take 40 mg by mouth at bedtime. Refills: 2    trimethoprim (TRIMPEX) 100 MG tablet Take 100 mg by mouth every evening.      STOP taking these medications     Cranberry 500 MG CAPS        Allergies  Allergen Reactions  . Influenza Vac Split [Flu Virus Vaccine] Other (See Comments)    Gets the flu  . Percocet [Oxycodone-Acetaminophen] Anaphylaxis and Shortness Of Breath  . Tape Other (See Comments)    Blisters skin---------------------USE PAPER TAPE  . Ciprofloxacin Tinitus  . Latex Rash   Follow-up Information    Follow up with GRAPEY,DAVID S, MD In 2  weeks.   Specialty:  Urology   Why:  hospital follow up   Contact information:   [place] [id number] [phone]        The results of significant diagnostics from this hospitalization (including imaging, microbiology, ancillary and laboratory) are listed below for reference.    Significant Diagnostic Studies: US Renal  10/12/2015  CLINICAL DATA:  Hydronephrosis EXAM: RENAL / URINARY TRACT ULTRASOUND COMPLETE COMPARISON:  10/09/2015 FINDINGS: Right Kidney: Length: 11.8 cm. Echogenicity within normal limits. No mass or hydronephrosis visualized. Left Kidney: Length: 11.1 cm.  Mild fullness of the collecting system is noted Bladder: Decompressed by a Foley catheter IMPRESSION: Mild fullness of the left collecting system. This is stable from the recent CT examination. The prominence seen on the right on the prior exam has resolved in the interval. Electronically Signed   By: Inez Catalina M.D.   On: 10/12/2015  11:35   Ct Renal Stone Study  10/09/2015  CLINICAL DATA:  Suprapubic catheter not draining. EXAM: CT ABDOMEN AND PELVIS WITHOUT CONTRAST TECHNIQUE: Multidetector CT imaging of the abdomen and pelvis was performed following the standard protocol without IV contrast. COMPARISON:  11/18/2014 FINDINGS: The suprapubic catheter extends into the urinary bladder. The bladder is collapsed around the catheter. There are multiple calculi in the bladder, new from 11/18/2014. These measure up to 1.9 cm. The calculus in the rectovesical fistula is again evident, measuring around 3.4 cm. There is mild hydronephrosis and hydroureter bilaterally. On the right there is a 9 mm calculus at the ureterovesical junction. On the left there is a 3 mm calculus of the ureter just below the iliac vasculature. There is cholelithiasis. There are unremarkable unenhanced appearances of the liver and bile ducts. There are unremarkable unenhanced appearances of the spleen, pancreas and adrenals. There is an  infrarenal abdominal aortic aneurysm with maximum AP diameter 5.9 cm. It measured 5.8 cm on 11/18/2014, and the difference is likely technical. No retroperitoneal hemorrhage. Stomach and small bowel are unremarkable. Colon is unremarkable. Left lower quadrant ostomy is unremarkable. No acute inflammatory changes are evident in the abdomen or pelvis. There is no ascites. There is no adenopathy. There is no significant abnormality in the lower chest. There is no significant skeletal lesion. IMPRESSION: 1. Urinary bladder appears to be collapsed around the suprapubic catheter. 2. 3 mm left ureteral calculus just below the level of the iliac vasculature. Mild hydronephrosis on the left. 3. 9 mm calculus in the urinary bladder at the level of the right ureterovesical junction. Mild right hydronephrosis. 4. At least 3 additional urinary bladder calculi, measuring up to 1.9 cm. There also is a 3.4 cm calculus in the vesicle rectal fistula. 5. Cholelithiasis. 6. Unchanged 5.9 cm infrarenal abdominal aortic aneurysm. Electronically Signed   By: Andreas Newport M.D.   On: 10/09/2015 22:33    Microbiology: Recent Results (from the past 240 hour(s))  Urine culture     Status: Abnormal (Preliminary result)   Collection Time: 10/09/15  9:58 PM  Result Value Ref Range Status   Specimen Description URINE, CATHETERIZED  Final   Special Requests NONE  Final   Culture (A)  Final    >=100,000 COLONIES/mL PSEUDOMONAS AERUGINOSA 50,000 COLONIES/mL GRAM NEGATIVE RODS    Report Status PENDING  Incomplete  Culture, blood (Routine X 2) w Reflex to ID Panel     Status: None (Preliminary result)   Collection Time: 10/09/15 10:33 PM  Result Value Ref Range Status   Specimen Description BLOOD RIGHT ANTECUBITAL  Final   Special Requests BOTTLES DRAWN AEROBIC AND ANAEROBIC 10ML  Final   Culture   Final    NO GROWTH 2 DAYS Performed at [facility]    Report Status PENDING  Incomplete  Culture, blood (Routine X 2)  w Reflex to ID Panel     Status: None (Preliminary result)   Collection Time: 10/09/15 10:48 PM  Result Value Ref Range Status   Specimen Description BLOOD LEFT ANTECUBITAL  Final   Special Requests BOTTLES DRAWN AEROBIC AND ANAEROBIC 5ML  Final   Culture   Final    NO GROWTH 2 DAYS Performed at [facility]    Report Status PENDING  Incomplete     Labs: Basic Metabolic Panel:  Recent Labs Lab 10/09/15 2232 10/10/15 0011 10/12/15 0853 10/13/15 0443  NA 137 134* 141 143  K 3.2* 3.0* 3.9 3.5  CL 97*  97* 109 106  CO2 29 27 23 26   GLUCOSE 114* 114* 148* 119*  BUN 18 18 13 11   CREATININE 1.35* 1.37* 1.29* 1.23  CALCIUM 8.9 8.6* 8.8* 8.7*  MG  --  1.9  --   --    Liver Function Tests: No results for input(s): AST, ALT, ALKPHOS, BILITOT, PROT, ALBUMIN in the last 168 hours. No results for input(s): LIPASE, AMYLASE in the last 168 hours. No results for input(s): AMMONIA in the last 168 hours. CBC:  Recent Labs Lab 10/09/15 2232 10/10/15 0011  WBC 11.2* 10.5  NEUTROABS 7.8*  --   HGB 12.7* 13.2  HCT 38.3* 39.6  MCV 94.6 96.6  PLT 206 184   Cardiac Enzymes: No results for input(s): CKTOTAL, CKMB, CKMBINDEX, TROPONINI in the last 168 hours. BNP: BNP (last 3 results)  Recent Labs  10/10/15 0011  BNP 153.3*    ProBNP (last 3 results) No results for input(s): PROBNP in the last 8760 hours.  CBG:  Recent Labs Lab 10/11/15 0749 10/12/15 0752  GLUCAP 132* 131*   Signed:  Charlynne Cousins MD.  [facility] 10/13/2015, 8:32 AM

## 2015-10-13 NOTE — Progress Notes (Signed)
[facility] called for pt transportation to home.

## 2015-10-13 NOTE — Progress Notes (Signed)
Zachary Haas is providing the following services: Hospital Bed  If patient discharges after hours, please call [phone].   [name] 10/13/2015, 12:59 PM

## 2015-10-13 NOTE — Progress Notes (Signed)
Pt's wife selected [facility] for [facility] referral given to in house rep.  [facility] given order for Hospital Bed. Transportation sheet completed. Family asked to wait for the hospital bed to be delivered before pt is discharged.

## 2015-10-14 LAB — URINE CULTURE: Culture: >=100000 — AB

## 2015-10-15 LAB — CULTURE, BLOOD (ROUTINE X 2)
CULTURE: NO GROWTH
Culture: NO GROWTH

## 2015-10-29 ENCOUNTER — Encounter (HOSPITAL_COMMUNITY): Payer: Self-pay | Admitting: *Deleted

## 2015-10-29 ENCOUNTER — Emergency Department (HOSPITAL_COMMUNITY)
Admission: EM | Admit: 2015-10-29 | Discharge: 2015-10-29 | Disposition: A | Discharge status: Home / Self Care | Payer: Medicare Other | Attending: Emergency Medicine | Admitting: Emergency Medicine

## 2015-10-29 DIAGNOSIS — Z8739 Personal history of other diseases of the musculoskeletal system and connective tissue: Secondary | ICD-10-CM | POA: Diagnosis not present

## 2015-10-29 DIAGNOSIS — N182 Chronic kidney disease, stage 2 (mild): Secondary | ICD-10-CM | POA: Insufficient documentation

## 2015-10-29 DIAGNOSIS — F329 Major depressive disorder, single episode, unspecified: Secondary | ICD-10-CM | POA: Diagnosis not present

## 2015-10-29 DIAGNOSIS — Z8551 Personal history of malignant neoplasm of bladder: Secondary | ICD-10-CM | POA: Diagnosis not present

## 2015-10-29 DIAGNOSIS — Z87891 Personal history of nicotine dependence: Secondary | ICD-10-CM | POA: Insufficient documentation

## 2015-10-29 DIAGNOSIS — I129 Hypertensive chronic kidney disease with stage 1 through stage 4 chronic kidney disease, or unspecified chronic kidney disease: Secondary | ICD-10-CM | POA: Insufficient documentation

## 2015-10-29 DIAGNOSIS — Z79899 Other long term (current) drug therapy: Secondary | ICD-10-CM | POA: Diagnosis not present

## 2015-10-29 DIAGNOSIS — T83098A Other mechanical complication of other indwelling urethral catheter, initial encounter: Secondary | ICD-10-CM | POA: Diagnosis not present

## 2015-10-29 DIAGNOSIS — Z923 Personal history of irradiation: Secondary | ICD-10-CM | POA: Insufficient documentation

## 2015-10-29 DIAGNOSIS — Z91040 Latex allergy status: Secondary | ICD-10-CM | POA: Diagnosis not present

## 2015-10-29 DIAGNOSIS — Z8546 Personal history of malignant neoplasm of prostate: Secondary | ICD-10-CM | POA: Diagnosis not present

## 2015-10-29 DIAGNOSIS — Z7901 Long term (current) use of anticoagulants: Secondary | ICD-10-CM | POA: Diagnosis not present

## 2015-10-29 DIAGNOSIS — Z862 Personal history of diseases of the blood and blood-forming organs and certain disorders involving the immune mechanism: Secondary | ICD-10-CM | POA: Diagnosis not present

## 2015-10-29 DIAGNOSIS — E7800 Pure hypercholesterolemia, unspecified: Secondary | ICD-10-CM | POA: Insufficient documentation

## 2015-10-29 DIAGNOSIS — I2510 Atherosclerotic heart disease of native coronary artery without angina pectoris: Secondary | ICD-10-CM | POA: Diagnosis not present

## 2015-10-29 DIAGNOSIS — T83090A Other mechanical complication of cystostomy catheter, initial encounter: Secondary | ICD-10-CM

## 2015-10-29 DIAGNOSIS — Z951 Presence of aortocoronary bypass graft: Secondary | ICD-10-CM | POA: Diagnosis not present

## 2015-10-29 DIAGNOSIS — Z9889 Other specified postprocedural states: Secondary | ICD-10-CM | POA: Insufficient documentation

## 2015-10-29 DIAGNOSIS — Z8719 Personal history of other diseases of the digestive system: Secondary | ICD-10-CM | POA: Insufficient documentation

## 2015-10-29 DIAGNOSIS — I252 Old myocardial infarction: Secondary | ICD-10-CM | POA: Insufficient documentation

## 2015-10-29 DIAGNOSIS — Y658 Other specified misadventures during surgical and medical care: Secondary | ICD-10-CM | POA: Insufficient documentation

## 2015-10-29 NOTE — ED Notes (Addendum)
[facility] called to transport pt back home. [facility] was unable to give ETA. Family was notified

## 2015-10-29 NOTE — ED Notes (Signed)
Pt presents by [facility] due to superpubic not draining.

## 2015-10-29 NOTE — ED Provider Notes (Signed)
CSN: [id number]     Arrival date & time 10/29/15  1444 History   First MD Initiated Contact with Patient 10/29/15 1504     Chief Complaint  Patient presents with  . Superpubic Not Draining        HPI Presents with initial complaints of suprapubic catheter not draining today. Since emptying the catheter bag at 1230 pm there is 350cc of urine output from the catheter. Family reports it recently started draining again. Pt without complaints. No pain. No vomiting. No fever. No other complaints. Pt and family did not attempt irrigation prior to arrival.    Past Medical History  Diagnosis Date  . Personal history of other diseases of circulatory system   . Other and unspecified coagulation defects   . Depressive disorder, not elsewhere classified   . Pure hypercholesterolemia   . Radiotherapy   . Rectal fistula   . Personal history of tobacco use, presenting hazards to health     1-1 1/2 ppd for 50 years  . Malignant neoplasm of bladder, part unspecified   . Other chronic cystitis   . Intestinovesical fistula   . Gross hematuria   . Malignant neoplasm of prostate ([place])   . Myocardial infarction ([place]) 1992, 1998  . Aortic aneurysm ([place]) 2008  . Hypertension   . Tendinitis   . Breathing problem     shallow/ chronic productive cough  . Bronchitis   . GERD (gastroesophageal reflux disease)   . Headache(784.0)     sinus  . Coronary artery disease   . Left bundle branch block   . Prostate cancer ([place])   . Bladder cancer ([place])   . CKD (chronic kidney disease), stage II    Past Surgical History  Procedure Laterality Date  . Colon surgery  1966    removed 6 " colon  . Colon surgery  1968    scar tissue  . Balloon dilation  1992    heart attack  . Angioplasty  1993  . Bypass graft  1998    heart attack  . Prostate ca  04/1997    Dr. Valere Dross  . Radiation seeding implant  05/1997  . Coloc cauterization  05/1998  . Bladder ca  08/2006  . Cystoscopy  1999    with biopsy x2  .  Spt tube  2011  . Prostate surgery  1998    seed inplantation  radiation treatments  . Coronary artery bypass graft    . Cardiac catheterization    . Cataract extraction    . Laparoscopic diverted colostomy  08/04/2012    Procedure: LAPAROSCOPIC DIVERTED COLOSTOMY;  Surgeon: Edward Jolly, MD;  Location: [facility];  Service: General;  Laterality: N/A;  Laparoscopic Colostomy, surgery start time 13:24  . Cystostomy  08/04/2012    Procedure: CYSTOSTOMY SUPRAPUBIC;  Surgeon: Bernestine Amass, MD;  Location: [facility];  Service: Urology;  Laterality: N/A;  Cysto via Supra Pubic Tract and Insertion of New Suprapubic Tube, possible Bladder Biopsy, PROCEDURE START 1300, STOP 1314  . Cystoscopy N/A 10/22/2012    Procedure: CYSTOSCOPY;  Surgeon: Bernestine Amass, MD;  Location: [facility];  Service: Urology;  Laterality: N/A;  Flexible CYSTOSCOPY VIA SUPRAPUBIC TRACT, HOLMIUM LASER LITHOTRIPSY, NEW SUPRAPUBIC TUBE INSERTION   . Insertion of suprapubic catheter N/A 10/22/2012    Procedure: INSERTION OF SUPRAPUBIC CATHETER;  Surgeon: Bernestine Amass, MD;  Location: [facility];  Service: Urology;  Laterality: N/A;  . Holmium laser application N/A [id number]  Procedure: HOLMIUM LASER APPLICATION;  Surgeon: Bernestine Amass, MD;  Location: [facility];  Service: Urology;  Laterality: N/A;   Family History  Problem Relation Age of Onset  . Stroke Mother     Ischemic Stroke  . Cancer Father     prostate cancer   Social History  Substance Use Topics  . Smoking status: Former Smoker -- 50 years    Types: Cigarettes    Quit date: 07/02/1996  . Smokeless tobacco: Never Used     Comment: quit 1998  . Alcohol Use: No    Review of Systems    Allergies  Influenza vac split; Percocet; Tape; Ciprofloxacin; and Latex  Home Medications   Prior to Admission medications   Medication Sig Start Date End Date Taking? Authorizing Provider  apixaban (ELIQUIS) 5 MG TABS tablet Take 1 tablet (5 mg total) by mouth 2 (two) times  daily. 08/01/15   Belva Crome, MD  buPROPion (WELLBUTRIN) 100 MG tablet Take 100 mg by mouth 2 (two) times daily.  07/16/12   Historical Provider, MD  Cholecalciferol (VITAMIN D3) 50000 UNITS CAPS Take 5,000 Units by mouth daily.    Historical Provider, MD  citalopram (CELEXA) 40 MG tablet Take 40 mg by mouth daily. 11/09/14   Historical Provider, MD  dextromethorphan-guaiFENesin (MUCINEX DM) 30-600 MG per 12 hr tablet Take 1 tablet by mouth every 12 (twelve) hours.     Historical Provider, MD  digoxin (LANOXIN) 0.125 MG tablet Take 1 tablet (0.125 mg total) by mouth daily. 11/23/14   Belva Crome, MD  docusate sodium (COLACE) 100 MG capsule Take 300 mg by mouth at bedtime.    Historical Provider, MD  donepezil (ARICEPT) 10 MG tablet Take 10 mg by mouth daily. 08/24/15   Historical Provider, MD  furosemide (LASIX) 80 MG tablet Take 1 tablet (80 mg total) by mouth 2 (two) times daily. 11/30/12   Josetta Huddle, MD  memantine (NAMENDA) 10 MG tablet Take 10 mg by mouth 2 (two) times daily.    Historical Provider, MD  metoprolol tartrate (LOPRESSOR) 25 MG tablet Take 0.5 tablets (12.5 mg total) by mouth 2 (two) times daily. 01/11/15   Belva Crome, MD  Multiple Vitamins-Minerals (MULTIVITAMIN WITH MINERALS) tablet Take 1 tablet by mouth daily.    Historical Provider, MD  OVER THE COUNTER MEDICATION Take 5 g by mouth 2 (two) times daily. (fiber)    Historical Provider, MD  potassium chloride SA (K-DUR,KLOR-CON) 20 MEQ tablet Take 1 tablet (20 mEq total) by mouth 2 (two) times daily. 11/30/12   Josetta Huddle, MD  simvastatin (ZOCOR) 40 MG tablet Take 40 mg by mouth at bedtime. 12/25/14   Historical Provider, MD  trimethoprim (TRIMPEX) 100 MG tablet Take 100 mg by mouth every evening.    Historical Provider, MD   BP 111/63 mmHg  Pulse 62  Temp(Src) 98.2 F (36.8 C) (Oral)  Resp 18  SpO2 95% Physical Exam  Constitutional: He is oriented to person, place, and time. He appears well-developed and well-nourished.   HENT:  Head: Normocephalic.  Eyes: EOM are normal.  Neck: Normal range of motion.  Pulmonary/Chest: Effort normal.  Abdominal: He exhibits no distension. There is no tenderness.  Suprapubic catheter in place. Irrigated without issue. Draining with 350cc of urine in catheter bag  Musculoskeletal: Normal range of motion.  Neurological: He is alert and oriented to person, place, and time.  Psychiatric: He has a normal mood and affect.  Nursing note and vitals reviewed.  ED Course  Procedures (including critical care time) Labs Review Labs Reviewed - No data to display  Imaging Review No results found. I have personally reviewed and evaluated these images and lab results as part of my medical decision-making.   EKG Interpretation None      MDM   Final diagnoses:  Complication, suprapubic catheter obstruction, initial encounter ([place])    Draining appropriately now. No complaints. Irrigated catheter without issues. Dc home. pcp and urology follow up as needed. Return precautions given    Jola Schmidt, MD 10/29/15 1525

## 2015-10-29 NOTE — ED Notes (Signed)
Pt from home via EMS. Pt spouse reports that pt has suprapubic catheter and has had 350 cc output of urine since she drained it at 12:30 pm today (in 2.5 hr).Spouse adds that the pt only had 300 output  in 12 hrs yesterday. Pt has hx of kidney stones. Pt in NAD and denies pain.

## 2015-10-31 ENCOUNTER — Encounter (HOSPITAL_COMMUNITY): Payer: Self-pay | Admitting: Emergency Medicine

## 2015-10-31 ENCOUNTER — Emergency Department (HOSPITAL_COMMUNITY)
Admission: EM | Admit: 2015-10-31 | Discharge: 2015-10-31 | Disposition: A | Discharge status: Home / Self Care | Payer: Medicare Other | Attending: Emergency Medicine | Admitting: Emergency Medicine

## 2015-10-31 ENCOUNTER — Emergency Department (HOSPITAL_COMMUNITY): Payer: Medicare Other

## 2015-10-31 ENCOUNTER — Telehealth: Payer: Self-pay | Admitting: *Deleted

## 2015-10-31 DIAGNOSIS — I2510 Atherosclerotic heart disease of native coronary artery without angina pectoris: Secondary | ICD-10-CM | POA: Diagnosis not present

## 2015-10-31 DIAGNOSIS — Z91040 Latex allergy status: Secondary | ICD-10-CM | POA: Diagnosis not present

## 2015-10-31 DIAGNOSIS — Z8551 Personal history of malignant neoplasm of bladder: Secondary | ICD-10-CM | POA: Insufficient documentation

## 2015-10-31 DIAGNOSIS — K219 Gastro-esophageal reflux disease without esophagitis: Secondary | ICD-10-CM | POA: Diagnosis not present

## 2015-10-31 DIAGNOSIS — Z9889 Other specified postprocedural states: Secondary | ICD-10-CM | POA: Insufficient documentation

## 2015-10-31 DIAGNOSIS — Z8546 Personal history of malignant neoplasm of prostate: Secondary | ICD-10-CM | POA: Diagnosis not present

## 2015-10-31 DIAGNOSIS — N182 Chronic kidney disease, stage 2 (mild): Secondary | ICD-10-CM | POA: Diagnosis not present

## 2015-10-31 DIAGNOSIS — Z8739 Personal history of other diseases of the musculoskeletal system and connective tissue: Secondary | ICD-10-CM | POA: Insufficient documentation

## 2015-10-31 DIAGNOSIS — T83091A Other mechanical complication of indwelling urethral catheter, initial encounter: Secondary | ICD-10-CM | POA: Insufficient documentation

## 2015-10-31 DIAGNOSIS — I129 Hypertensive chronic kidney disease with stage 1 through stage 4 chronic kidney disease, or unspecified chronic kidney disease: Secondary | ICD-10-CM | POA: Diagnosis not present

## 2015-10-31 DIAGNOSIS — R339 Retention of urine, unspecified: Secondary | ICD-10-CM | POA: Diagnosis present

## 2015-10-31 DIAGNOSIS — Z7901 Long term (current) use of anticoagulants: Secondary | ICD-10-CM | POA: Diagnosis not present

## 2015-10-31 DIAGNOSIS — I252 Old myocardial infarction: Secondary | ICD-10-CM | POA: Diagnosis not present

## 2015-10-31 DIAGNOSIS — R319 Hematuria, unspecified: Secondary | ICD-10-CM | POA: Insufficient documentation

## 2015-10-31 DIAGNOSIS — E7800 Pure hypercholesterolemia, unspecified: Secondary | ICD-10-CM | POA: Insufficient documentation

## 2015-10-31 DIAGNOSIS — F329 Major depressive disorder, single episode, unspecified: Secondary | ICD-10-CM | POA: Diagnosis not present

## 2015-10-31 DIAGNOSIS — Z87891 Personal history of nicotine dependence: Secondary | ICD-10-CM | POA: Insufficient documentation

## 2015-10-31 DIAGNOSIS — Y658 Other specified misadventures during surgical and medical care: Secondary | ICD-10-CM | POA: Diagnosis not present

## 2015-10-31 DIAGNOSIS — Z792 Long term (current) use of antibiotics: Secondary | ICD-10-CM | POA: Diagnosis not present

## 2015-10-31 DIAGNOSIS — Z951 Presence of aortocoronary bypass graft: Secondary | ICD-10-CM | POA: Insufficient documentation

## 2015-10-31 DIAGNOSIS — Z8709 Personal history of other diseases of the respiratory system: Secondary | ICD-10-CM | POA: Diagnosis not present

## 2015-10-31 DIAGNOSIS — T839XXA Unspecified complication of genitourinary prosthetic device, implant and graft, initial encounter: Secondary | ICD-10-CM

## 2015-10-31 LAB — COMPREHENSIVE METABOLIC PANEL
ALK PHOS: 57 U/L (ref 38–126)
ALT: 11 U/L — AB (ref 17–63)
AST: 22 U/L (ref 15–41)
Albumin: 3.2 g/dL — ABNORMAL LOW (ref 3.5–5.0)
Anion gap: 13 (ref 5–15)
BUN: 14 mg/dL (ref 6–20)
CALCIUM: 8.8 mg/dL — AB (ref 8.9–10.3)
CHLORIDE: 97 mmol/L — AB (ref 101–111)
CO2: 26 mmol/L (ref 22–32)
CREATININE: 1.39 mg/dL — AB (ref 0.61–1.24)
GFR, EST AFRICAN AMERICAN: 52 mL/min — AB (ref >60)
GFR, EST NON AFRICAN AMERICAN: 45 mL/min — AB (ref >60)
Glucose, Bld: 214 mg/dL — ABNORMAL HIGH (ref 65–99)
Potassium: 3.4 mmol/L — ABNORMAL LOW (ref 3.5–5.1)
SODIUM: 136 mmol/L (ref 135–145)
Total Bilirubin: 1.1 mg/dL (ref 0.3–1.2)
Total Protein: 7.3 g/dL (ref 6.5–8.1)

## 2015-10-31 LAB — URINALYSIS, ROUTINE W REFLEX MICROSCOPIC
BILIRUBIN URINE: NEGATIVE
Glucose, UA: NEGATIVE mg/dL
KETONES UR: NEGATIVE mg/dL
NITRITE: POSITIVE — AB
PH: 7.5 (ref 5.0–8.0)
Protein, ur: 100 mg/dL — AB
SPECIFIC GRAVITY, URINE: 1.012 (ref 1.005–1.030)

## 2015-10-31 LAB — CBC
HCT: 44.8 % (ref 39.0–52.0)
HEMOGLOBIN: 14.5 g/dL (ref 13.0–17.0)
MCH: 32.1 pg (ref 26.0–34.0)
MCHC: 32.4 g/dL (ref 30.0–36.0)
MCV: 99.1 fL (ref 78.0–100.0)
Platelets: 180 10*3/uL (ref 150–400)
RBC: 4.52 MIL/uL (ref 4.22–5.81)
RDW: 16 % — ABNORMAL HIGH (ref 11.5–15.5)
WBC: 9.5 10*3/uL (ref 4.0–10.5)

## 2015-10-31 LAB — URINE MICROSCOPIC-ADD ON

## 2015-10-31 MED ORDER — SODIUM CHLORIDE 0.9 % IV BOLUS (SEPSIS)
500.0000 mL | Freq: Once | INTRAVENOUS | Status: AC
  Administered 2015-10-31: 500 mL via INTRAVENOUS

## 2015-10-31 NOTE — Progress Notes (Signed)
Pt is being followed by Alvis Lemmings for [facility], and has [place] for DME for Hospital Bed.

## 2015-10-31 NOTE — ED Notes (Signed)
LAC 20 guage IV catheter removed at time of discharge.

## 2015-10-31 NOTE — ED Notes (Signed)
Per wife pt complaint of urinary retention/clogged suprapubic catheter onset this morning. Site irrigated by home care nurse without success. Pt denies pain.

## 2015-10-31 NOTE — Discharge Instructions (Signed)
It was our pleasure to provide your ER care today - we hope that you feel better.  Rest. Drink plenty of fluids.  Your urologist's notes recommend holding antibiotic treatment unless you should develop fevers, and other signs of systemic infection - they can follow up on the results of the urine culture that was sent today.  If catheter not draining, your home nurse may try to flush to remove any clot that may be sitting at the opening of the catheter, preventing your urine from flowing into the catheter.   Given the gross blood in urine, hold/stop taking the eliquis for now.  Instead take an enteric coated aspirin a day.   If bleeding resolves, contact your doctor to inquire about re-starting the eliquis then (and stopping the aspirin).  Follow up with your urologist for recheck in the next few days - call office to arrange appointment.  From today's lab results, your potassium level is slightly low (3.4) - eat plenty of fruits and vegetables, take one extra of your potassium supplement pills each day for the next week, and follow up with your doctor in the next couple weeks.   Return to ER if worse, new symptoms, fevers, vomiting, weak/fainting, no urine output, other concern.      Hematuria, Adult Hematuria is blood in your urine. It can be caused by a bladder infection, kidney infection, prostate infection, kidney stone, or cancer of your urinary tract. Infections can usually be treated with medicine, and a kidney stone usually will pass through your urine. If neither of these is the cause of your hematuria, further workup to find out the reason may be needed. It is very important that you tell your health care provider about any blood you see in your urine, even if the blood stops without treatment or happens without causing pain. Blood in your urine that happens and then stops and then happens again can be a symptom of a very serious condition. Also, pain is not a symptom in the initial  stages of many urinary cancers. HOME CARE INSTRUCTIONS   Drink lots of fluid, 3-4 quarts a day. If you have been diagnosed with an infection, cranberry juice is especially recommended, in addition to large amounts of water.  Avoid caffeine, tea, and carbonated beverages because they tend to irritate the bladder.  Avoid alcohol because it may irritate the prostate.  Take all medicines as directed by your health care provider.  If you were prescribed an antibiotic medicine, finish it all even if you start to feel better.  If you have been diagnosed with a kidney stone, follow your health care provider's instructions regarding straining your urine to catch the stone.  Empty your bladder often. Avoid holding urine for long periods of time.  After a bowel movement, women should cleanse front to back. Use each tissue only once.  Empty your bladder before and after sexual intercourse if you are a male. SEEK MEDICAL CARE IF: 1. You develop back pain. 2. You have a fever. 3. You have a feeling of sickness in your stomach (nausea) or vomiting. 4. Your symptoms are not better in 3 days. Return sooner if you are getting worse. SEEK IMMEDIATE MEDICAL CARE IF:  1. You develop severe vomiting and are unable to keep the medicine down. 2. You develop severe back or abdominal pain despite taking your medicines. 3. You begin passing a large amount of blood or clots in your urine. 4. You feel extremely weak or faint, or  you pass out. MAKE SURE YOU:  1. Understand these instructions. 2. Will watch your condition. 3. Will get help right away if you are not doing well or get worse.   This information is not intended to replace advice given to you by your health care provider. Make sure you discuss any questions you have with your health care provider.   Document Released: 06/18/2005 Document Revised: 07/09/2014 Document Reviewed: 02/16/2013 Elsevier Interactive Patient Education 2016 [place], Adult A Foley catheter is a soft, flexible tube that is placed into the bladder to drain urine. A Foley catheter may be inserted if:  You leak urine or are not able to control when you urinate (urinary incontinence).  You are not able to urinate when you need to (urinary retention).  You had prostate surgery or surgery on the genitals.  You have certain medical conditions, such as multiple sclerosis, dementia, or a spinal cord injury. If you are going home with a Foley catheter in place, follow the instructions below. TAKING CARE OF THE CATHETER 5. Wash your hands with soap and water. 6. Using mild soap and warm water on a clean washcloth:  Clean the area on your body closest to the catheter insertion site using a circular motion, moving away from the catheter. Never wipe toward the catheter because this could sweep bacteria up into the urethra and cause infection.  Remove all traces of soap. Pat the area dry with a clean towel. For males, reposition the foreskin. 7. Attach the catheter to your leg so there is no tension on the catheter. Use adhesive tape or a leg strap. If you are using adhesive tape, remove any sticky residue left behind by the previous tape you used. 8. Keep the drainage bag below the level of the bladder, but keep it off the floor. 9. Check throughout the day to be sure the catheter is working and urine is draining freely. Make sure the tubing does not become kinked. 10. Do not pull on the catheter or try to remove it. Pulling could damage internal tissues. TAKING CARE OF THE DRAINAGE BAGS You will be given two drainage bags to take home. One is a large overnight drainage bag, and the other is a smaller leg bag that fits underneath clothing. You may wear the overnight bag at any time, but you should never wear the smaller leg bag at night. Follow the instructions below for how to empty, change, and clean your drainage bags. Emptying the  Drainage Bag You must empty your drainage bag when it is  - full or at least 2-3 times a day. 5. Wash your hands with soap and water. 6. Keep the drainage bag below your hips, below the level of your bladder. This stops urine from going back into the tubing and into your bladder. 7. Hold the dirty bag over the toilet or a clean container. 8. Open the pour spout at the bottom of the bag and empty the urine into the toilet or container. Do not let the pour spout touch the toilet, container, or any other surface. Doing so can place bacteria on the bag, which can cause an infection. 9. Clean the pour spout with a gauze pad or cotton ball that has rubbing alcohol on it. 10. Close the pour spout. 11. Attach the bag to your leg with adhesive tape or a leg strap. 12. Wash your hands well. Changing the Drainage Bag Change your drainage bag once  a month or sooner if it starts to smell bad or look dirty. Below are steps to follow when changing the drainage bag. 4. Wash your hands with soap and water. 5. Pinch off the rubber catheter so that urine does not spill out. 6. Disconnect the catheter tube from the drainage tube at the connection valve. Do not let the tubes touch any surface. 7. Clean the end of the catheter tube with an alcohol wipe. Use a different alcohol wipe to clean the end of the drainage tube. 8. Connect the catheter tube to the drainage tube of the clean drainage bag. 9. Attach the new bag to the leg with adhesive tape or a leg strap. Avoid attaching the new bag too tightly. 10. Wash your hands well. Cleaning the Drainage Bag 1. Wash your hands with soap and water. 2. Wash the bag in warm, soapy water. 3. Rinse the bag thoroughly with warm water. 4. Fill the bag with a solution of white vinegar and water (1 cup vinegar to 1 qt warm water [.2 L vinegar to 1 L warm water]). Close the bag and soak it for 30 minutes in the solution. 5. Rinse the bag with warm water. 6. Hang the bag to dry  with the pour spout open and hanging downward. 7. Store the clean bag (once it is dry) in a clean plastic bag. 8. Wash your hands well. PREVENTING INFECTION  Wash your hands before and after handling your catheter.  Take showers daily and wash the area where the catheter enters your body. Do not take baths. Replace wet leg straps with dry ones, if this applies.  Do not use powders, sprays, or lotions on the genital area. Only use creams, lotions, or ointments as directed by your caregiver.  For females, wipe from front to back after each bowel movement.  Drink enough fluids to keep your urine clear or pale yellow unless you have a fluid restriction.  Do not let the drainage bag or tubing touch or lie on the floor.  Wear cotton underwear to absorb moisture and to keep your skin drier. SEEK MEDICAL CARE IF:   Your urine is cloudy or smells unusually bad.  Your catheter becomes clogged.  You are not draining urine into the bag or your bladder feels full.  Your catheter starts to leak. SEEK IMMEDIATE MEDICAL CARE IF:   You have pain, swelling, redness, or pus where the catheter enters the body.  You have pain in the abdomen, legs, lower back, or bladder.  You have a fever.  You see blood fill the catheter, or your urine is pink or red.  You have nausea, vomiting, or chills.  Your catheter gets pulled out. MAKE SURE YOU:   Understand these instructions.  Will watch your condition.  Will get help right away if you are not doing well or get worse.   This information is not intended to replace advice given to you by your health care provider. Make sure you discuss any questions you have with your health care provider.   Document Released: 06/18/2005 Document Revised: 11/02/2013 Document Reviewed: 06/09/2012 Elsevier Interactive Patient Education [employer].

## 2015-10-31 NOTE — ED Notes (Addendum)
Awaiting adequate urine output for urine culture.

## 2015-10-31 NOTE — ED Provider Notes (Addendum)
CSN: [id number]     Arrival date & time 10/31/15  1245 History   First MD Initiated Contact with Patient 10/31/15 1309     Chief Complaint  Patient presents with  . Urinary Retention     (Consider location/radiation/quality/duration/timing/severity/associated sxs/prior Treatment) The history is provided by the patient and the spouse.  Patient with hx afib on eliquis, remote hx prostate ca/radiation therapy w subrapubic cath x years, hx recent hx kidney stones/hydronephrosis, c/o decreased urine in bag in past day, along w gross hematuria.  Home health rn flushed catheter this AM, but little urine returned - pt was referred to ED. Up until past day, good urine output per spouse, and urine had been clear/yellow, not odorous. No fever or chills. No flank pain. Has been eating and drinking normally. No trauma to catheter. On eliquis, no other abn bruising or bleeding.       Past Medical History  Diagnosis Date  . Personal history of other diseases of circulatory system   . Other and unspecified coagulation defects   . Depressive disorder, not elsewhere classified   . Pure hypercholesterolemia   . Radiotherapy   . Rectal fistula   . Personal history of tobacco use, presenting hazards to health     1-1 1/2 ppd for 50 years  . Malignant neoplasm of bladder, part unspecified   . Other chronic cystitis   . Intestinovesical fistula   . Gross hematuria   . Malignant neoplasm of prostate ([place])   . Myocardial infarction ([place]) 1992, 1998  . Aortic aneurysm ([place]) 2008  . Hypertension   . Tendinitis   . Breathing problem     shallow/ chronic productive cough  . Bronchitis   . GERD (gastroesophageal reflux disease)   . Headache(784.0)     sinus  . Coronary artery disease   . Left bundle branch block   . Prostate cancer ([place])   . Bladder cancer ([place])   . CKD (chronic kidney disease), stage II    Past Surgical History  Procedure Laterality Date  . Colon surgery  1966    removed 6 "  colon  . Colon surgery  1968    scar tissue  . Balloon dilation  1992    heart attack  . Angioplasty  1993  . Bypass graft  1998    heart attack  . Prostate ca  04/1997    Dr. Valere Dross  . Radiation seeding implant  05/1997  . Coloc cauterization  05/1998  . Bladder ca  08/2006  . Cystoscopy  1999    with biopsy x2  . Spt tube  2011  . Prostate surgery  1998    seed inplantation  radiation treatments  . Coronary artery bypass graft    . Cardiac catheterization    . Cataract extraction    . Laparoscopic diverted colostomy  08/04/2012    Procedure: LAPAROSCOPIC DIVERTED COLOSTOMY;  Surgeon: Edward Jolly, MD;  Location: [facility];  Service: General;  Laterality: N/A;  Laparoscopic Colostomy, surgery start time 13:24  . Cystostomy  08/04/2012    Procedure: CYSTOSTOMY SUPRAPUBIC;  Surgeon: Bernestine Amass, MD;  Location: [facility];  Service: Urology;  Laterality: N/A;  Cysto via Supra Pubic Tract and Insertion of New Suprapubic Tube, possible Bladder Biopsy, PROCEDURE START 1300, STOP 1314  . Cystoscopy N/A 10/22/2012    Procedure: CYSTOSCOPY;  Surgeon: Bernestine Amass, MD;  Location: [facility];  Service: Urology;  Laterality: N/A;  Flexible CYSTOSCOPY VIA SUPRAPUBIC TRACT,  HOLMIUM LASER LITHOTRIPSY, NEW SUPRAPUBIC TUBE INSERTION   . Insertion of suprapubic catheter N/A 10/22/2012    Procedure: INSERTION OF SUPRAPUBIC CATHETER;  Surgeon: Bernestine Amass, MD;  Location: [facility];  Service: Urology;  Laterality: N/A;  . Holmium laser application N/A [id number]    Procedure: HOLMIUM LASER APPLICATION;  Surgeon: Bernestine Amass, MD;  Location: [facility];  Service: Urology;  Laterality: N/A;   Family History  Problem Relation Age of Onset  . Stroke Mother     Ischemic Stroke  . Cancer Father     prostate cancer   Social History  Substance Use Topics  . Smoking status: Former Smoker -- 50 years    Types: Cigarettes    Quit date: 07/02/1996  . Smokeless tobacco: Never Used     Comment: quit 1998  .  Alcohol Use: No    Review of Systems  Constitutional: Negative for fever and chills.  HENT: Negative for sore throat.   Eyes: Negative for redness.  Respiratory: Negative for shortness of breath.   Cardiovascular: Negative for chest pain.  Gastrointestinal: Negative for vomiting, abdominal pain and blood in stool.  Genitourinary: Positive for hematuria. Negative for dysuria and flank pain.  Musculoskeletal: Negative for back pain and neck pain.  Skin: Negative for rash.  Neurological: Negative for headaches.  Hematological: Does not bruise/bleed easily.  Psychiatric/Behavioral: Negative for agitation.      Allergies  Influenza vac split; Percocet; Tape; Ciprofloxacin; and Latex  Home Medications   Prior to Admission medications   Medication Sig Start Date End Date Taking? Authorizing Provider  apixaban (ELIQUIS) 5 MG TABS tablet Take 1 tablet (5 mg total) by mouth 2 (two) times daily. 08/01/15   Belva Crome, MD  buPROPion (WELLBUTRIN) 100 MG tablet Take 100 mg by mouth 2 (two) times daily.  07/16/12   Historical Provider, MD  Cholecalciferol (VITAMIN D3) 50000 UNITS CAPS Take 5,000 Units by mouth daily.    Historical Provider, MD  citalopram (CELEXA) 40 MG tablet Take 40 mg by mouth daily. 11/09/14   Historical Provider, MD  dextromethorphan-guaiFENesin (MUCINEX DM) 30-600 MG per 12 hr tablet Take 1 tablet by mouth every 12 (twelve) hours.     Historical Provider, MD  digoxin (LANOXIN) 0.125 MG tablet Take 1 tablet (0.125 mg total) by mouth daily. 11/23/14   Belva Crome, MD  docusate sodium (COLACE) 100 MG capsule Take 300 mg by mouth at bedtime.    Historical Provider, MD  donepezil (ARICEPT) 10 MG tablet Take 10 mg by mouth daily. 08/24/15   Historical Provider, MD  furosemide (LASIX) 80 MG tablet Take 1 tablet (80 mg total) by mouth 2 (two) times daily. 11/30/12   Josetta Huddle, MD  memantine (NAMENDA) 10 MG tablet Take 10 mg by mouth 2 (two) times daily.    Historical Provider, MD   metoprolol tartrate (LOPRESSOR) 25 MG tablet Take 0.5 tablets (12.5 mg total) by mouth 2 (two) times daily. 01/11/15   Belva Crome, MD  Multiple Vitamins-Minerals (MULTIVITAMIN WITH MINERALS) tablet Take 1 tablet by mouth daily.    Historical Provider, MD  OVER THE COUNTER MEDICATION Take 5 g by mouth 2 (two) times daily. (fiber)    Historical Provider, MD  potassium chloride SA (K-DUR,KLOR-CON) 20 MEQ tablet Take 1 tablet (20 mEq total) by mouth 2 (two) times daily. 11/30/12   Josetta Huddle, MD  simvastatin (ZOCOR) 40 MG tablet Take 40 mg by mouth at bedtime. 12/25/14   Historical Provider,  MD  trimethoprim (TRIMPEX) 100 MG tablet Take 100 mg by mouth every evening.    Historical Provider, MD   BP 155/86 mmHg  Pulse 81  Temp(Src) 97.6 F (36.4 C)  Resp 18  SpO2 95% Physical Exam  Constitutional: He appears well-developed and well-nourished. No distress.  HENT:  Mouth/Throat: Oropharynx is clear and moist.  Eyes: Conjunctivae are normal. No scleral icterus.  Neck: Neck supple. No tracheal deviation present.  Cardiovascular: Normal rate, normal heart sounds and intact distal pulses.   Pulmonary/Chest: Effort normal and breath sounds normal. No accessory muscle usage. No respiratory distress.  Abdominal: Soft. Bowel sounds are normal. He exhibits no distension and no mass. There is no tenderness. There is no rebound and no guarding.  Genitourinary:  Suprapubic cath site without sign of infection. No cva tenderness.  Grossly bloody urine in bag, approx 300 cc.   Musculoskeletal: Normal range of motion.  Neurological: He is alert.  Skin: Skin is warm and dry. No rash noted. He is not diaphoretic.  Psychiatric: He has a normal mood and affect.  Nursing note and vitals reviewed.   ED Course  Procedures (including critical care time) Labs Review   Results for orders placed or performed during the hospital encounter of 10/31/15  CBC  Result Value Ref Range   WBC 9.5 4.0 - 10.5 K/uL    RBC 4.52 4.22 - 5.81 MIL/uL   Hemoglobin 14.5 13.0 - 17.0 g/dL   HCT 44.8 39.0 - 52.0 %   MCV 99.1 78.0 - 100.0 fL   MCH 32.1 26.0 - 34.0 pg   MCHC 32.4 30.0 - 36.0 g/dL   RDW 16.0 (H) 11.5 - 15.5 %   Platelets 180 150 - 400 K/uL  Comprehensive metabolic panel  Result Value Ref Range   Sodium 136 135 - 145 mmol/L   Potassium 3.4 (L) 3.5 - 5.1 mmol/L   Chloride 97 (L) 101 - 111 mmol/L   CO2 26 22 - 32 mmol/L   Glucose, Bld 214 (H) 65 - 99 mg/dL   BUN 14 6 - 20 mg/dL   Creatinine, Ser 1.39 (H) 0.61 - 1.24 mg/dL   Calcium 8.8 (L) 8.9 - 10.3 mg/dL   Total Protein 7.3 6.5 - 8.1 g/dL   Albumin 3.2 (L) 3.5 - 5.0 g/dL   AST 22 15 - 41 U/L   ALT 11 (L) 17 - 63 U/L   Alkaline Phosphatase 57 38 - 126 U/L   Total Bilirubin 1.1 0.3 - 1.2 mg/dL   GFR calc non Af Amer 45 (L) >60 mL/min   GFR calc Af Amer 52 (L) >60 mL/min   Anion gap 13 5 - 15   US Renal  10/31/2015  CLINICAL DATA:  Decreased urinary output, question hydronephrosis, history of bladder cancer and prostate cancer post surgery EXAM: RENAL / URINARY TRACT ULTRASOUND COMPLETE COMPARISON:  10/12/2015 FINDINGS: Image quality and visualization degraded secondary to body habitus. Right Kidney: Length: 11.8 cm. Normal cortical thickness. No gross evidence of renal mass or hydronephrosis. Left Kidney: Length: 11.2 cm. Mild cortical thinning. Mild hydronephrosis similar to prior exam. No gross evidence of renal mass. Bladder: Decompressed by Foley catheter, unable to assess IMPRESSION: Mild persistent LEFT hydronephrosis. Electronically Signed   By: Lavonia Dana M.D.   On: 10/31/2015 14:25   US Renal  10/12/2015  CLINICAL DATA:  Hydronephrosis EXAM: RENAL / URINARY TRACT ULTRASOUND COMPLETE COMPARISON:  10/09/2015 FINDINGS: Right Kidney: Length: 11.8 cm. Echogenicity within normal limits. No mass  or hydronephrosis visualized. Left Kidney: Length: 11.1 cm.  Mild fullness of the collecting system is noted Bladder: Decompressed by a Foley catheter  IMPRESSION: Mild fullness of the left collecting system. This is stable from the recent CT examination. The prominence seen on the right on the prior exam has resolved in the interval. Electronically Signed   By: Inez Catalina M.D.   On: 10/12/2015 11:35   Ct Renal Stone Study  10/09/2015  CLINICAL DATA:  Suprapubic catheter not draining. EXAM: CT ABDOMEN AND PELVIS WITHOUT CONTRAST TECHNIQUE: Multidetector CT imaging of the abdomen and pelvis was performed following the standard protocol without IV contrast. COMPARISON:  11/18/2014 FINDINGS: The suprapubic catheter extends into the urinary bladder. The bladder is collapsed around the catheter. There are multiple calculi in the bladder, new from 11/18/2014. These measure up to 1.9 cm. The calculus in the rectovesical fistula is again evident, measuring around 3.4 cm. There is mild hydronephrosis and hydroureter bilaterally. On the right there is a 9 mm calculus at the ureterovesical junction. On the left there is a 3 mm calculus of the ureter just below the iliac vasculature. There is cholelithiasis. There are unremarkable unenhanced appearances of the liver and bile ducts. There are unremarkable unenhanced appearances of the spleen, pancreas and adrenals. There is an infrarenal abdominal aortic aneurysm with maximum AP diameter 5.9 cm. It measured 5.8 cm on 11/18/2014, and the difference is likely technical. No retroperitoneal hemorrhage. Stomach and small bowel are unremarkable. Colon is unremarkable. Left lower quadrant ostomy is unremarkable. No acute inflammatory changes are evident in the abdomen or pelvis. There is no ascites. There is no adenopathy. There is no significant abnormality in the lower chest. There is no significant skeletal lesion. IMPRESSION: 1. Urinary bladder appears to be collapsed around the suprapubic catheter. 2. 3 mm left ureteral calculus just below the level of the iliac vasculature. Mild hydronephrosis on the left. 3. 9 mm calculus  in the urinary bladder at the level of the right ureterovesical junction. Mild right hydronephrosis. 4. At least 3 additional urinary bladder calculi, measuring up to 1.9 cm. There also is a 3.4 cm calculus in the vesicle rectal fistula. 5. Cholelithiasis. 6. Unchanged 5.9 cm infrarenal abdominal aortic aneurysm. Electronically Signed   By: Andreas Newport M.D.   On: 10/09/2015 22:33      I have personally reviewed and evaluated these images and lab results as part of my medical decision-making.   MDM   Iv ns bolus. Labs.  Spouse requests we call patients urologist, Dr Risa Grill - paged.   Reviewed nursing notes and prior charts for additional history.   Patient/fam reports suprapubic cath irrigated at home but little urine output.  In ED approx 300 cc urine out, and subsequent bladder scan with no residual urine in bladder.  Ultrasound shows persistent mild left hydro, not acutely worse from prior.   Discussed with covering urologist for Dr Risa Grill, Dr Pilar Jarvis, who recommends d/c to home, with outpatient f/u with Dr Risa Grill.    Pt afeb. No fevers at home. No nv.   Pt/fam indicates on epiquis for hx afib, will hold eliquis, rec asa instead given gross hematuria.   UA results noted.  On review recent prior urology admit/notes, they feel patient will be chronically colonized with multiple bact (also c/w prior u cx) and that they feel in absence systemic signs clinically signif infection - do not recommend tx w broad spectrum abx - as such, u cx has been sent, will  hold abx unless signs infection.   Return precautions provided.        Lajean Saver, MD 10/31/15 [phone]

## 2015-11-04 ENCOUNTER — Telehealth: Payer: Self-pay

## 2015-11-04 NOTE — Telephone Encounter (Signed)
Critical result from Lab for Urine + CRE Chart sent to EDP today

## 2015-11-05 LAB — URINE CULTURE: Culture: >=100000 — AB

## 2015-11-07 ENCOUNTER — Telehealth (HOSPITAL_BASED_OUTPATIENT_CLINIC_OR_DEPARTMENT_OTHER): Payer: Self-pay | Admitting: Emergency Medicine

## 2015-11-07 NOTE — Telephone Encounter (Signed)
Chart returned from EDP, Chronic colonization with urology followup, no treatment needed at this point per MD

## 2016-05-25 IMAGING — US US RENAL
1 series · 14 of 25 positions shown · non-contrast
Comparison: 10/09/2015

CLINICAL DATA: Hydronephrosis

EXAM:
RENAL / URINARY TRACT ULTRASOUND COMPLETE

[Series 1: us renal · 0.25mm/px · 14 of 27 slices shown]
[im 1/27]
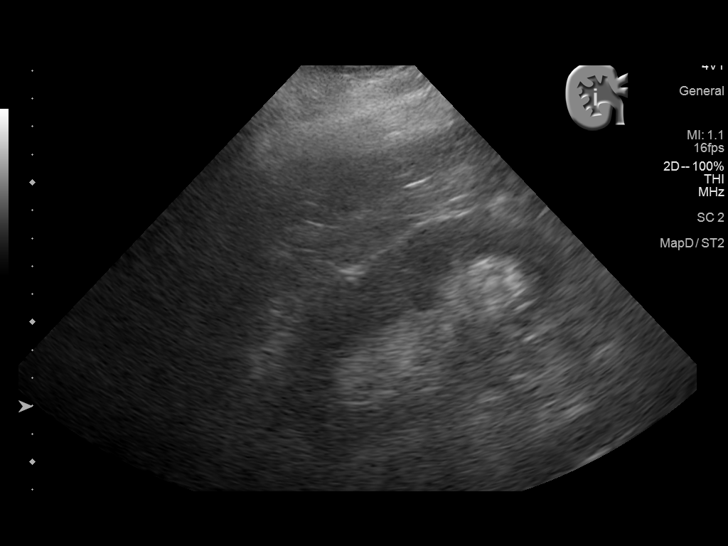
[im 3/27]
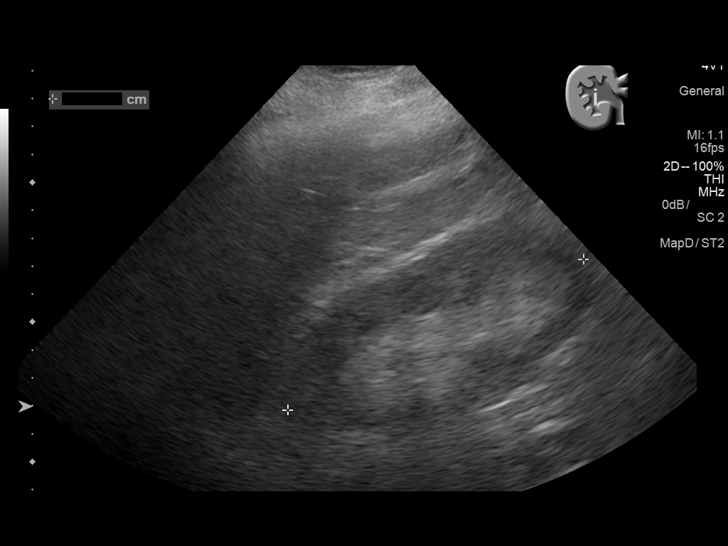
[im 5/27]
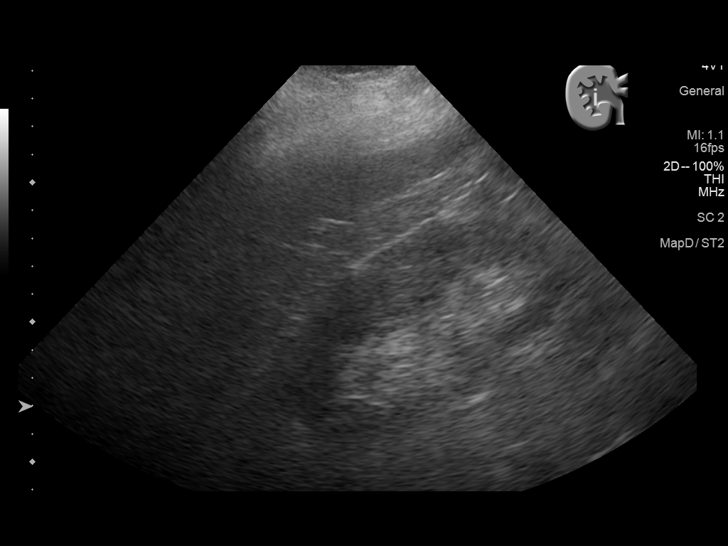
[im 7/27]
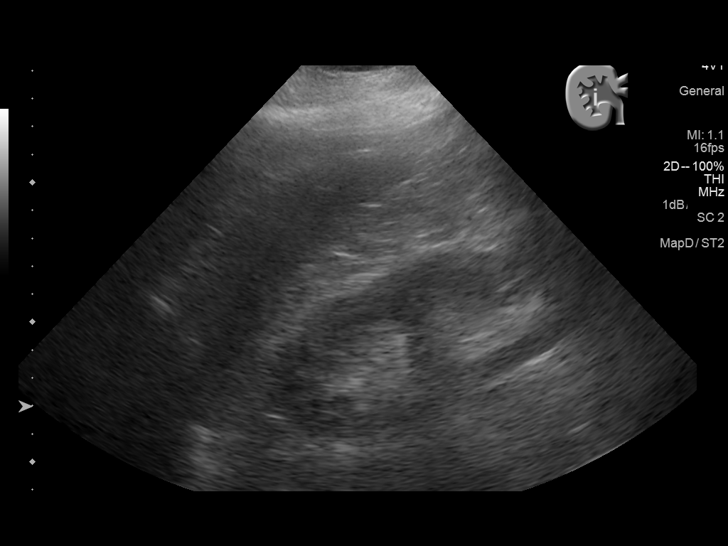
[im 9/27]
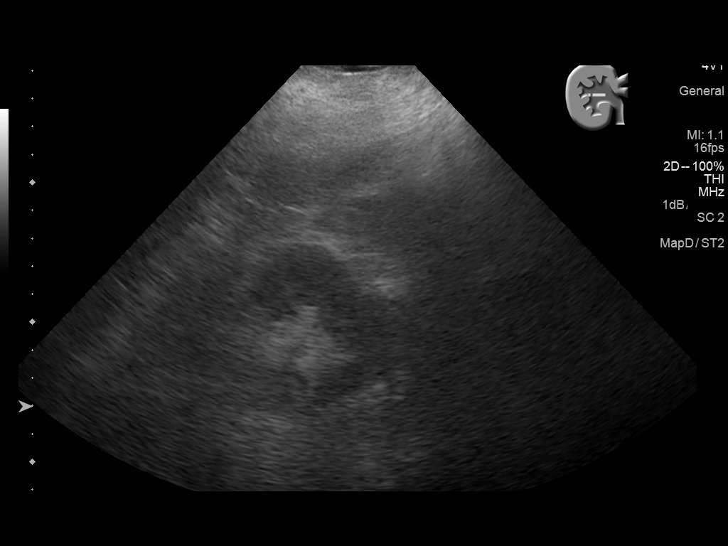
[im 10/27]
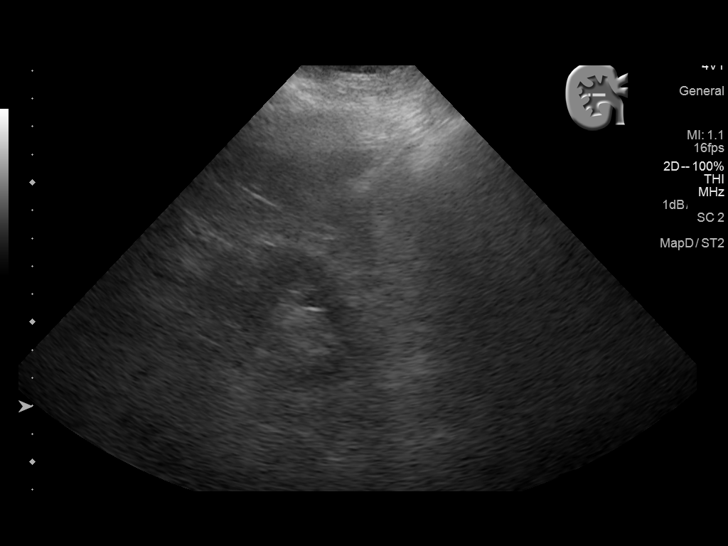
[im 12/27]
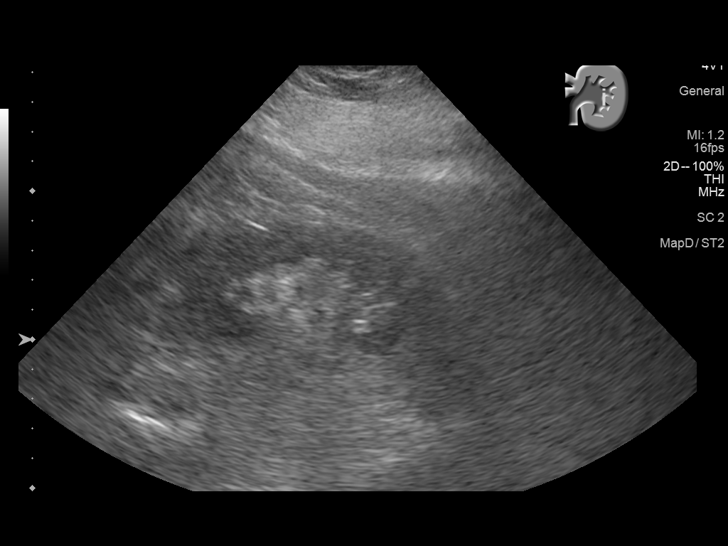
[im 15/27]
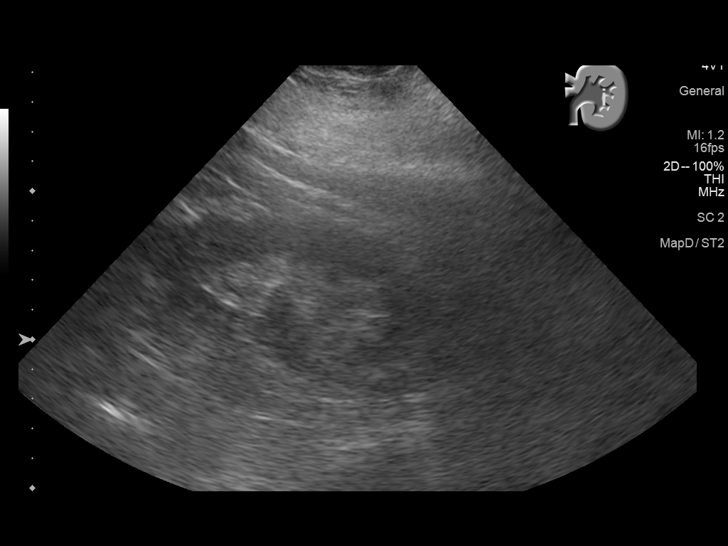
[im 17/27]
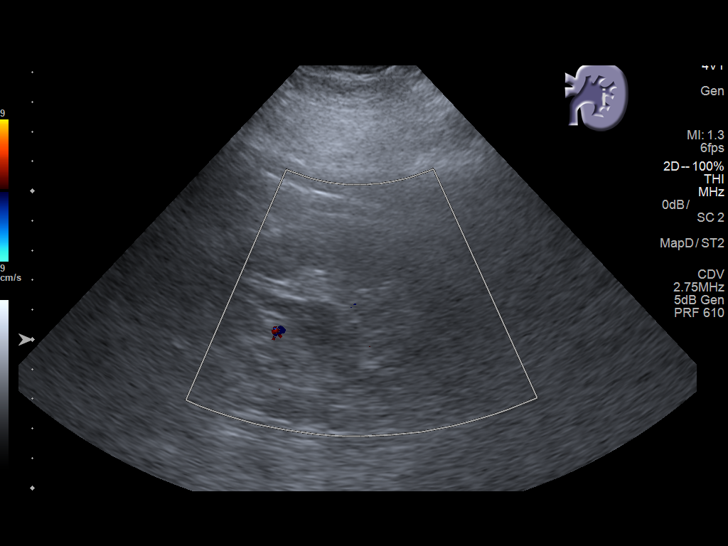
[im 18/27]
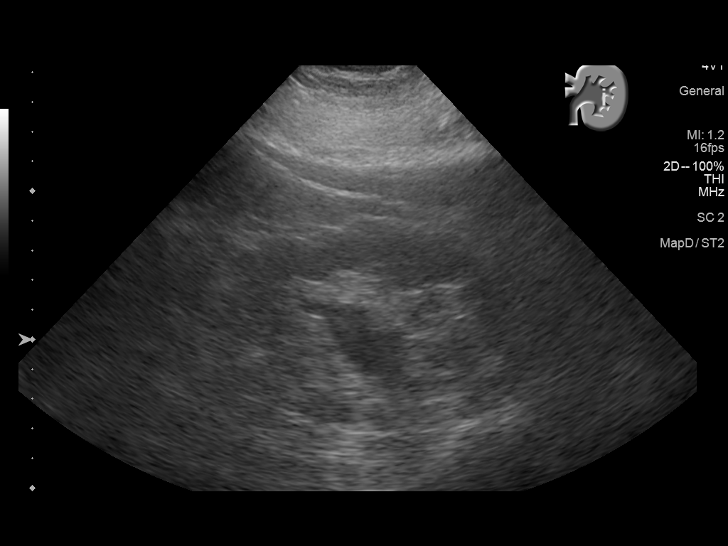
[im 20/27]
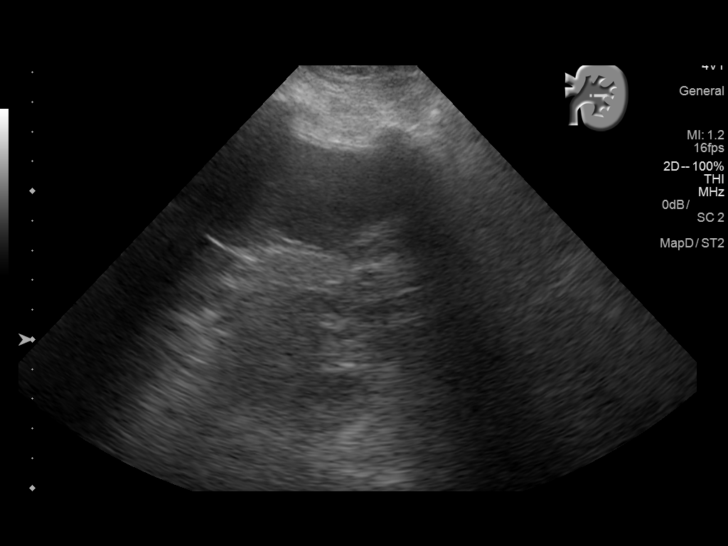
[im 22/27]
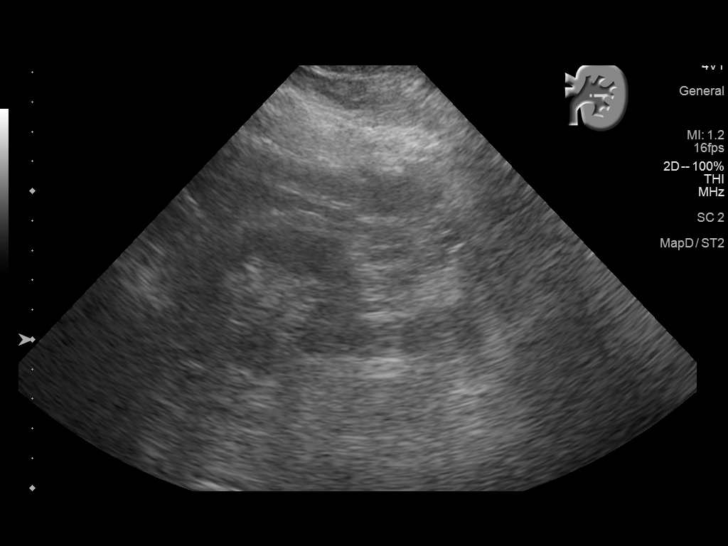
[im 24/27]
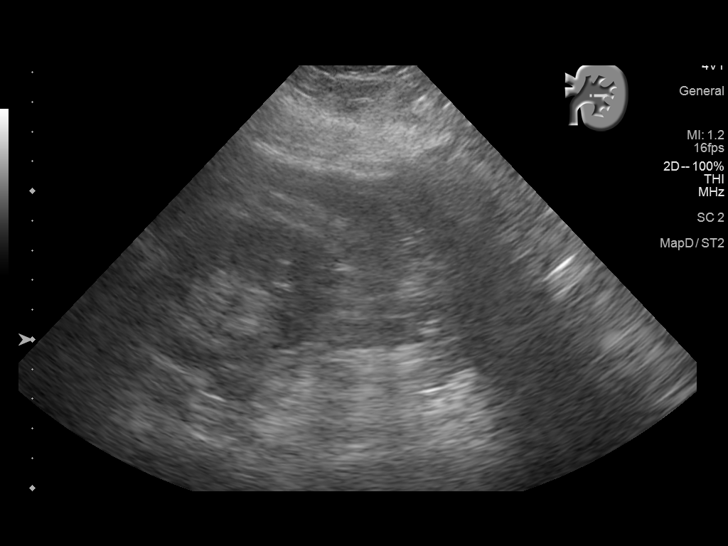
[im 27/27]
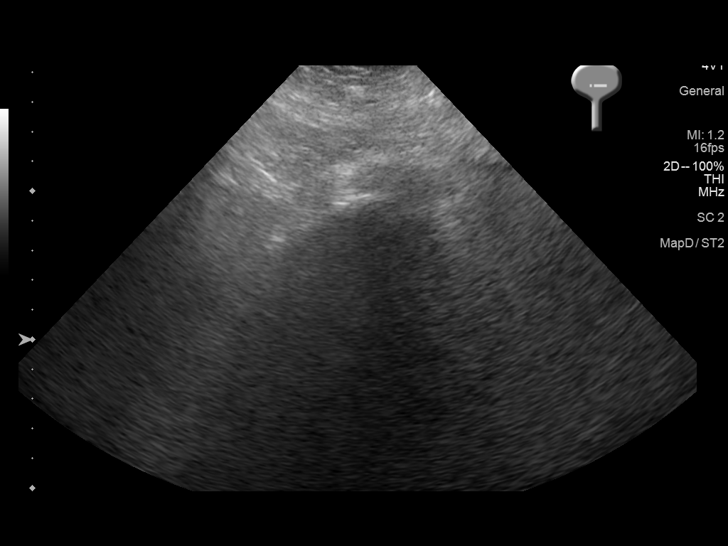

[14 of 25 positions shown; findings below may reference images not displayed]

FINDINGS: Right Kidney:

Length: 11.8 cm. Echogenicity within normal limits. No mass or
hydronephrosis visualized.

Left Kidney:

Length: 11.1 cm.  Mild fullness of the collecting system is noted

Bladder:

Decompressed by a Foley catheter
IMPRESSION: Mild fullness of the left collecting system. This is stable from the
recent CT examination. The prominence seen on the right on the prior
exam has resolved in the interval.

## 2016-06-01 DEATH — deceased
